# Patient Record
Sex: Female | Born: 1935
Health system: Southern US, Community
[De-identification: ages and names within clinical notes are randomized; demographics above are authoritative.]

## PROBLEM LIST (undated history)

## (undated) DIAGNOSIS — N3941 Urge incontinence: Secondary | ICD-10-CM

## (undated) DIAGNOSIS — M1990 Unspecified osteoarthritis, unspecified site: Secondary | ICD-10-CM

## (undated) DIAGNOSIS — R32 Unspecified urinary incontinence: Secondary | ICD-10-CM

## (undated) DIAGNOSIS — I10 Essential (primary) hypertension: Secondary | ICD-10-CM

## (undated) DIAGNOSIS — E785 Hyperlipidemia, unspecified: Secondary | ICD-10-CM

## (undated) DIAGNOSIS — I739 Peripheral vascular disease, unspecified: Secondary | ICD-10-CM

## (undated) DIAGNOSIS — E039 Hypothyroidism, unspecified: Secondary | ICD-10-CM

## (undated) DIAGNOSIS — M810 Age-related osteoporosis without current pathological fracture: Secondary | ICD-10-CM

## (undated) DIAGNOSIS — J310 Chronic rhinitis: Secondary | ICD-10-CM

## (undated) DIAGNOSIS — Z923 Personal history of irradiation: Secondary | ICD-10-CM

## (undated) DIAGNOSIS — L120 Bullous pemphigoid: Secondary | ICD-10-CM

## (undated) DIAGNOSIS — I700 Atherosclerosis of aorta: Principal | ICD-10-CM

## (undated) DIAGNOSIS — I059 Rheumatic mitral valve disease, unspecified: Secondary | ICD-10-CM

## (undated) HISTORY — DX: Hypothyroidism, unspecified: E03.9

## (undated) HISTORY — DX: Chronic rhinitis: J31.0

## (undated) HISTORY — DX: Hyperlipidemia, unspecified: E78.5

## (undated) HISTORY — DX: Age-related osteoporosis without current pathological fracture: M81.0

## (undated) HISTORY — PX: TONSILLECTOMY: SUR1361

## (undated) HISTORY — DX: Atherosclerosis of aorta: I70.0

## (undated) HISTORY — PX: OTHER SURGICAL HISTORY: SHX169

## (undated) HISTORY — DX: Rheumatic mitral valve disease, unspecified: I05.9

## (undated) HISTORY — DX: Essential (primary) hypertension: I10

## (undated) HISTORY — DX: Bullous pemphigoid: L12.0

## (undated) HISTORY — DX: Unspecified urinary incontinence: R32

## (undated) HISTORY — DX: Urge incontinence: N39.41

## (undated) HISTORY — DX: Peripheral vascular disease, unspecified: I73.9

---
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
## 1969-06-24 HISTORY — PX: TOTAL ABDOMINAL HYSTERECTOMY W/ BILATERAL SALPINGOOPHORECTOMY: SHX83

## 1998-12-23 HISTORY — PX: BUNIONECTOMY: SHX129

## 2000-02-22 HISTORY — PX: BUNIONECTOMY: SHX129

## 2000-02-22 HISTORY — PX: EYE SURGERY: SHX253

## 2002-03-24 HISTORY — PX: NASAL POLYP SURGERY: SHX186

## 2004-09-08 ENCOUNTER — Ambulatory Visit: Payer: Self-pay | Admitting: Adult Health

## 2004-09-22 ENCOUNTER — Ambulatory Visit: Payer: Self-pay | Admitting: Internal Medicine

## 2004-10-26 ENCOUNTER — Ambulatory Visit: Payer: Self-pay | Admitting: Internal Medicine

## 2004-12-07 ENCOUNTER — Ambulatory Visit: Payer: Self-pay | Admitting: Internal Medicine

## 2005-03-08 ENCOUNTER — Ambulatory Visit: Payer: Self-pay | Admitting: Internal Medicine

## 2005-07-15 ENCOUNTER — Encounter: Admission: RE | Admit: 2005-07-15 | Discharge: 2005-07-15 | Payer: Self-pay | Admitting: Otolaryngology

## 2005-10-06 ENCOUNTER — Encounter: Admission: RE | Admit: 2005-10-06 | Discharge: 2005-10-06 | Payer: Self-pay | Admitting: Otolaryngology

## 2005-10-10 ENCOUNTER — Ambulatory Visit (HOSPITAL_BASED_OUTPATIENT_CLINIC_OR_DEPARTMENT_OTHER): Admission: RE | Admit: 2005-10-10 | Discharge: 2005-10-10 | Payer: Self-pay | Admitting: Otolaryngology

## 2005-10-10 ENCOUNTER — Ambulatory Visit (HOSPITAL_COMMUNITY): Admission: RE | Admit: 2005-10-10 | Discharge: 2005-10-10 | Payer: Self-pay | Admitting: Otolaryngology

## 2005-10-10 ENCOUNTER — Encounter (INDEPENDENT_AMBULATORY_CARE_PROVIDER_SITE_OTHER): Payer: Self-pay | Admitting: Specialist

## 2007-01-03 ENCOUNTER — Encounter: Admission: RE | Admit: 2007-01-03 | Discharge: 2007-01-03 | Payer: Self-pay | Admitting: Internal Medicine

## 2007-05-21 ENCOUNTER — Ambulatory Visit: Payer: Self-pay | Admitting: Internal Medicine

## 2007-06-08 ENCOUNTER — Ambulatory Visit: Payer: Self-pay | Admitting: Internal Medicine

## 2007-06-21 ENCOUNTER — Ambulatory Visit: Payer: Self-pay | Admitting: Internal Medicine

## 2007-08-04 DIAGNOSIS — J310 Chronic rhinitis: Secondary | ICD-10-CM

## 2007-08-04 DIAGNOSIS — J45909 Unspecified asthma, uncomplicated: Secondary | ICD-10-CM

## 2007-10-09 ENCOUNTER — Ambulatory Visit: Payer: Self-pay | Admitting: Internal Medicine

## 2008-01-15 ENCOUNTER — Encounter: Payer: Self-pay | Admitting: Internal Medicine

## 2008-01-22 ENCOUNTER — Ambulatory Visit: Payer: Self-pay | Admitting: Internal Medicine

## 2008-01-22 LAB — CONVERTED CEMR LAB
Basophils Relative: 0.7 % (ref 0.0–1.0)
Eosinophils Absolute: 0.5 10*3/uL (ref 0.0–0.7)
Eosinophils Relative: 6.5 % — ABNORMAL HIGH (ref 0.0–5.0)
HCT: 43 % (ref 36.0–46.0)
Hemoglobin: 13.9 g/dL (ref 12.0–15.0)
MCV: 91 fL (ref 78.0–100.0)
Monocytes Absolute: 0.6 10*3/uL (ref 0.1–1.0)
Neutro Abs: 4.8 10*3/uL (ref 1.4–7.7)
RBC: 4.72 M/uL (ref 3.87–5.11)
WBC: 8.3 10*3/uL (ref 4.5–10.5)

## 2008-02-22 ENCOUNTER — Ambulatory Visit: Payer: Self-pay | Admitting: Internal Medicine

## 2008-06-24 ENCOUNTER — Ambulatory Visit: Payer: Self-pay | Admitting: Internal Medicine

## 2008-10-28 ENCOUNTER — Ambulatory Visit: Payer: Self-pay | Admitting: Internal Medicine

## 2009-05-20 ENCOUNTER — Ambulatory Visit: Payer: Self-pay | Admitting: Internal Medicine

## 2009-06-17 ENCOUNTER — Telehealth: Payer: Self-pay | Admitting: Internal Medicine

## 2009-11-18 ENCOUNTER — Ambulatory Visit: Payer: Self-pay | Admitting: Internal Medicine

## 2009-12-02 ENCOUNTER — Telehealth: Payer: Self-pay | Admitting: Internal Medicine

## 2009-12-18 ENCOUNTER — Telehealth (INDEPENDENT_AMBULATORY_CARE_PROVIDER_SITE_OTHER): Payer: Self-pay | Admitting: *Deleted

## 2009-12-23 ENCOUNTER — Ambulatory Visit: Payer: Self-pay | Admitting: Internal Medicine

## 2010-01-05 ENCOUNTER — Telehealth (INDEPENDENT_AMBULATORY_CARE_PROVIDER_SITE_OTHER): Payer: Self-pay | Admitting: *Deleted

## 2010-02-22 ENCOUNTER — Ambulatory Visit: Payer: Self-pay | Admitting: Internal Medicine

## 2010-05-24 ENCOUNTER — Ambulatory Visit: Payer: Self-pay | Admitting: Internal Medicine

## 2010-05-24 DIAGNOSIS — J330 Polyp of nasal cavity: Secondary | ICD-10-CM | POA: Insufficient documentation

## 2010-06-03 ENCOUNTER — Ambulatory Visit (HOSPITAL_BASED_OUTPATIENT_CLINIC_OR_DEPARTMENT_OTHER): Admission: RE | Admit: 2010-06-03 | Discharge: 2010-06-03 | Payer: Self-pay | Admitting: Urology

## 2010-07-15 ENCOUNTER — Telehealth (INDEPENDENT_AMBULATORY_CARE_PROVIDER_SITE_OTHER): Payer: Self-pay | Admitting: *Deleted

## 2010-11-23 NOTE — Assessment & Plan Note (Signed)
Summary: rov 3 months///kp   Primary Provider/Referring Provider:  Frederik Pear  CC:  3 month follow up visit-asthma; Doing great since on Advair HFA.Marland Kitchen  History of Present Illness:  November 18, 2009- Asthma, rhinitis Blames cold weather and the temperature change of going in and out for a worse winter. Humidifier has helped. Denies having obvious infection.  Quit Spiriva as unhelpful. She went back to Advair 100/50, which doesn't seem to last a half day. Cough productive clear with plugs. Denies heart burn. Persistent nasal stuffiness with some postnasal drip. Uses Neti pot some.  December 23, 2009-Asthma, rhinitis She liked Dulera sample a lot- improved both nose and chest, but unaffordable. She hasn't restarted Adviar yet. Both cause hoarseness. She feels well today with no wheeze.  Feb 22, 2010- Asthma, rhinitis She liked Advair HFA 230-21, but it caused intolerable hoarseness using 2 puffs two times a day.  She had failed Singulair and found Spiriva device too difficult. She is using the Advair 1 puff twice daily and says it gives great control.  May 24, 2010- Asthma, rhinitis, hx nasal polyps She is very pleased with Advair HFA, which has given her the best control of wheeze. It does still cause some hoarseness, which was the reason we took her off Advair Diskus.. She is only using it once daily. No cough, but some poistnasal drip. Has not used her rescue inhaler at all.   Asthma History    Initial Asthma Severity Rating:    Age range: 12+ years    Symptoms: 0-2 days/week    Nighttime Awakenings: 0-2/month    Interferes w/ normal activity: no limitations    SABA use (not for EIB): 0-2 days/week    Asthma Severity Assessment: Intermittent   Preventive Screening-Counseling & Management  Alcohol-Tobacco     Alcohol type: wine     Smoking Status: never     Passive Smoke Exposure: yes     Passive Smoke Counseling: to avoid passive smoke exposure  Current Medications  (verified): 1)  Centrum   Tabs (Multiple Vitamins-Minerals) .Marland Kitchen.. 1 By Mouth Once Daily 2)  Fish Oil   Oil (Fish Oil) 3)  Adult Aspirin Ec Low Strength 81 Mg  Tbec (Aspirin) .Marland Kitchen.. 1 By Mouth Once Daily 4)  Levothyroxine Sodium 50 Mcg  Tabs (Levothyroxine Sodium) .Marland Kitchen.. 1 By Mouth Once Daily 5)  Allegra 180 Mg  Tabs (Fexofenadine Hcl) .Marland Kitchen.. 1 By Mouth Once Daily As Needed 6)  Proair Hfa 108 (90 Base) Mcg/act Aers (Albuterol Sulfate) .... 2 Puffs Four Times A Day As Needed 7)  Vitamin D3 1000 Unit Tabs (Cholecalciferol) .... Take 1 By Mouth Once Daily 8)  Advair Hfa 230-21 Mcg/act Aero (Fluticasone-Salmeterol) .... Inhale 1 Puffs Once A Day.  Rinse Mouth Out After Use. 9)  Vitamin E 400 Unit Caps (Vitamin E) .... Take 1 By Mouth Once Daily 10)  Calcium 600 1500 Mg Tabs (Calcium Carbonate) .... Take 1 By Mouth Once Daily 11)  Fluticasone Propionate 50 Mcg/act Susp (Fluticasone Propionate) .Marland Kitchen.. 1 To 2 Sprays in Each Nostril Once Daily 12)  Mag-Ox 400 400 Mg Tabs (Magnesium Oxide) .... Take 1 By Mouth Once Daily  Allergies (verified): 1)  Codeine  Past History:  Past Medical History: Last updated: 01/22/2008 Asthma nasal polyps  Past Surgical History: Last updated: 01/22/2008 Nasal polypectomy x 2 - Dr. Annalee Genta  Family History: Last updated: 07-19-08 Mother- deceased age 62; natural causes; DM, Arthritis, Breast Cancer (age 37). Father- deceased age 33;  MI, DM. Sibling 1- living age 68; DM Type 2 Sibling 2- (half sister-father's side)living age 39; gasro int. disorders, allergies  Social History: Last updated: 07/01/2008 Patient never smoked.  Positive history of passive tobacco smoke exposure.  Exercise-times weekly Caffeine-2 cups daily ETOH-2-3 glasses of wine weekly Widowed with 3 children  Risk Factors: Smoking Status: never (05/24/2010) Passive Smoke Exposure: yes (05/24/2010)  Review of Systems      See HPI       The patient complains of shortness of breath with  activity, nasal congestion/difficulty breathing through nose, and sneezing.  The patient denies shortness of breath at rest, productive cough, non-productive cough, coughing up blood, chest pain, irregular heartbeats, acid heartburn, indigestion, loss of appetite, weight change, abdominal pain, difficulty swallowing, sore throat, tooth/dental problems, and headaches.         Does have persistent postnasal drip and some stuffiness  Vital Signs:  Patient profile:   77 year old female Height:      62 inches Weight:      160.50 pounds BMI:     29.46 O2 Sat:      95 % on Room air Pulse rate:   79 / minute BP sitting:   140 / 76  (right arm) Cuff size:   regular  Vitals Entered By: Reynaldo Minium CMA (May 24, 2010 10:47 AM)  O2 Flow:  Room air CC: 3 month follow up visit-asthma; Doing great since on Advair HFA.   Physical Exam  Additional Exam:  General: A/Ox3; pleasant and cooperative, NAD, SKIN: no rash, lesions NODES: no lymphadenopathy HEENT: [place]/AT, EOM- WNL, Conjuctivae- clear, PERRLA, TM-retracted, Nose- shiny pale mucosa  with question of recurrent polyp oin right., Throat- clear and wnl, hoarse without stridor, no post nasal drip, no thrush NECK: Supple w/ fair ROM, JVD- none, normal carotid impulses w/o bruits Thyroid-  CHEST: very clear to P&A HEART: RRR, no m/g/r heard ABDOMEN: Soft and nl; ZOX:WRUE, nl pulses, no edema  NEURO: Grossly intact to observation     Impression & Recommendations:  Problem # 1:  RHINITIS (ICD-472.0)  Persistent c/o sneezing, rhinorhea and recurrent nasal polyps. She didn't benefit from Singulair. Skin tests reportedly negative years ago.   Problem # 2:  ASTHMA (ICD-493.90) Very good control now without need for a rescue inhaler.  Problem # 3:  Hx of NASAL POLYP (ICD-471.0) Gaynelle Adu has had polypectomy twice. The ere is very shiney, flat polyp in R nare, vs shiney middle turbinate . This can be watched but I explained potential of nasal  steroid to suppress polyps.  Medications Added to Medication List This Visit: 1)  Advair Hfa 230-21 Mcg/act Aero (Fluticasone-salmeterol) .... Inhale 1 puffs once a day.  rinse mouth out after use. 2)  Astepro 0.15 % Soln (Azelastine hcl) .Marland Kitchen.. 1-2 sprays each nostril two times a day as needed  Other Orders: Est. Patient Level IV (45409)  Patient Instructions: 1)  Please schedule a follow-up appointment in 6 months. 2)  Try sample/ script Astepro, 1-2 puffs each nostril up to twice daily if needed for watery nose/ sneezing 3)  Continue fluticasone 1-2 sprays each nostril once daily to suppress the polyps. 4)  Other meds as before. 5)  Try sample of low dose Advair HFA, 2 puffs and rinse mouth , once or twice daily. if it works as well with less hoarseness, let us know and we will get you a script. Prescriptions: FLUTICASONE PROPIONATE 50 MCG/ACT SUSP (FLUTICASONE PROPIONATE) 1 to 2 sprays  in each nostril once daily  #1 x 6   Entered by:   Randell Loop CMA   Authorized by:   Waymon Budge MD   Signed by:   Randell Loop CMA on 05/24/2010   Method used:   Print then Give to Patient   RxID:   [id number] ASTEPRO 0.15 % SOLN (AZELASTINE HCL) 1-2 sprays each nostril two times a day as needed  #1 x prn   Entered and Authorized by:   Waymon Budge MD   Signed by:   Waymon Budge MD on 05/24/2010   Method used:   Print then Give to Patient   RxID:   [id number]

## 2010-11-23 NOTE — Progress Notes (Signed)
Summary: rx for advair HFA  Phone Note Call from Patient   Caller: Patient Call For: YOUNG Summary of Call: PT HAD SAMPLES OF LOW DOSE ADVAIR HFA . SHE WOULD LIKE PRESCRIPT CALLED TO [facility] [facility] [address] Initial call taken by: Rickard Patience,  July 15, 2010 10:22 AM  Follow-up for Phone Call        [facility]- ? if lower dose advair HFA is helping with hoarness. Vernie Murders  July 15, 2010 11:12 AM  called spoke with patient who states that the advair hfa has helped "tremendously" with her breathing and hoarseness.  would like to know if she may have a prescription.  advair hfa 45-21. Boone Master CNA/MA  July 16, 2010 3:53 PM   Additional Follow-up for Phone Call Additional follow up Details #1::        I made the change on med list. Please send. Additional Follow-up by: Waymon Budge MD,  July 16, 2010 5:04 PM    Additional Follow-up for Phone Call Additional follow up Details #2::    Rx was sent.  Spoke with pt and informed that this was done. Follow-up by: Vernie Murders,  July 16, 2010 5:12 PM  New/Updated Medications: ADVAIR HFA 45-21 MCG/ACT AERO (FLUTICASONE-SALMETEROL) 2 puffs and rinse mouth twice daily Prescriptions: ADVAIR HFA 45-21 MCG/ACT AERO (FLUTICASONE-SALMETEROL) 2 puffs and rinse mouth twice daily  #1 x 11   Entered by:   Vernie Murders   Authorized by:   Waymon Budge MD   Signed by:   Vernie Murders on 07/16/2010   Method used:   Electronically to        [facility] [phone]* (retail)       [address]       [place], [place]  [address]       Ph: [phone] or [phone]       Fax: [phone]   RxID:   [id number] ADVAIR HFA 45-21 MCG/ACT AERO (FLUTICASONE-SALMETEROL) 2 puffs and rinse mouth twice daily  #1 x prn   Entered by:   Waymon Budge MD   Authorized by:   Pulmonary Triage   Signed by:   Waymon Budge MD on 07/16/2010   Method used:   Historical   RxID:    [id number]

## 2010-11-23 NOTE — Assessment & Plan Note (Signed)
Summary: rov 2 months///kp   Primary Provider/Referring Provider:  Frederik Pear  CC:  2 month follow up visit.  History of Present Illness: 05/20/09- Asthma, rhinitis Too hard to get out in current hot weather, so she isn't going to her exercise program. Notes post nasal drip- clear.occasional wheeze. Denies cough, chest pain or palpitation.  November 18, 2009- Asthma, rhinitis Blames cold weather and the temperature change of going in and out for a worse winter. Humidifier has helped. Denies having obvious infection.  Quit Spiriva as unhelpful. She went back to Advair 100/50, which doesn't seem to last a half day. Cough productive clear with plugs. Denies heart burn. Persistent nasal stuffiness with some postnasal drip. Uses Neti pot some.  December 23, 2009-Asthma, rhinitis She liked Dulera sample a lot- improved both nose and chest, but unaffordable. She hasn't restarted Adviar yet. Both cause hoarseness. She feels well today with no wheeze.  Feb 22, 2010- Asthma, rhinitis She liked Advair HFA 230-21, but it caused intolerable hoarseness using 2 puffs two times a day.  She had failed Singulair and found Spiriva device too difficult. She is using the Advair 1 puff twice daily and says it gives great control.    Current Medications (verified): 1)  Centrum   Tabs (Multiple Vitamins-Minerals) .Marland Kitchen.. 1 By Mouth Once Daily 2)  Fish Oil   Oil (Fish Oil) 3)  Adult Aspirin Ec Low Strength 81 Mg  Tbec (Aspirin) .Marland Kitchen.. 1 By Mouth Once Daily 4)  Levothyroxine Sodium 50 Mcg  Tabs (Levothyroxine Sodium) .Marland Kitchen.. 1 By Mouth Once Daily 5)  Allegra 180 Mg  Tabs (Fexofenadine Hcl) .Marland Kitchen.. 1 By Mouth Once Daily As Needed 6)  Proair Hfa 108 (90 Base) Mcg/act Aers (Albuterol Sulfate) .... 2 Puffs Four Times A Day As Needed 7)  Vitamin D3 1000 Unit Tabs (Cholecalciferol) .... Take 1 By Mouth Once Daily 8)  Advair Hfa 230-21 Mcg/act Aero (Fluticasone-Salmeterol) .... Inhale 1 Puffs Two Times A Day.  Rinse Mouth Out After  Use. 9)  Vitamin E 400 Unit Caps (Vitamin E) .... Take 1 By Mouth Once Daily 10)  Calcium 600 1500 Mg Tabs (Calcium Carbonate) .... Take 1 By Mouth Once Daily 11)  Fluticasone Propionate 50 Mcg/act Susp (Fluticasone Propionate) .Marland Kitchen.. 1 To 2 Sprays in Each Nostril Once Daily 12)  Mag-Ox 400 400 Mg Tabs (Magnesium Oxide) .... Take 1 By Mouth Once Daily 13)  Zinc Gluconate 15 Mg Tabs (Zinc Gluconate) .... Take 1 By Mouth Once Daily  Allergies (verified): 1)  Codeine  Past History:  Past Medical History: Last updated: 01/22/2008 Asthma nasal polyps  Past Surgical History: Last updated: 01/22/2008 Nasal polypectomy x 2 - Dr. Annalee Genta  Family History: Last updated: 07-23-08 Mother- deceased age 57; natural causes; DM, Arthritis, Breast Cancer (age 89). Father- deceased age 35; MI, DM. Sibling 1- living age 51; DM Type 2 Sibling 2- (half sister-father's side)living age 61; gasro int. disorders, allergies  Social History: Last updated: 07/23/2008 Patient never smoked.  Positive history of passive tobacco smoke exposure.  Exercise-times weekly Caffeine-2 cups daily ETOH-2-3 glasses of wine weekly Widowed with 3 children  Risk Factors: Smoking Status: never (01/22/2008) Passive Smoke Exposure: yes (07/23/08)  Review of Systems      See HPI  The patient denies anorexia, fever, weight loss, weight gain, vision loss, decreased hearing, hoarseness, chest pain, syncope, dyspnea on exertion, peripheral edema, prolonged cough, headaches, hemoptysis, and severe indigestion/heartburn.    Vital Signs:  Patient profile:  77 year old female Height:      62 inches Weight:      159.50 pounds BMI:     29.28 O2 Sat:      97 % on Room air Pulse rate:   72 / minute BP sitting:   122 / 66  (left arm) Cuff size:   regular  Vitals Entered By: Reynaldo Minium CMA (Feb 22, 2010 10:43 AM)  O2 Flow:  Room air  Physical Exam  Additional Exam:  General: A/Ox3; pleasant and cooperative,  NAD, SKIN: no rash, lesions NODES: no lymphadenopathy HEENT: [place]/AT, EOM- WNL, Conjuctivae- clear, PERRLA, TM-retracted, Nose- clear, Throat- clear and wnl, hoarse without stridor, no post nasal drip, no thrush NECK: Supple w/ fair ROM, JVD- none, normal carotid impulses w/o bruits Thyroid-  CHEST: very clear to P&A HEART: RRR, no m/g/r heard ABDOMEN: Soft and nl; VVO:HYWV, nl pulses, no edema  NEURO: Grossly intact to observation     Impression & Recommendations:  Problem # 1:  ASTHMA (ICD-493.90) Her control is good enough now, that I suggested we test the lower limit by reducing Advair to once daily. If this is enough, she may see the benefit of reduced hoarseness. If she needs the control of two times a day therapy with Advair, then we can try adding a spacer. She is about to travel to [place].  Problem # 2:  RHINITIS (ICD-472.0) Assessment: Comment Only Not having a lot of sneezing or seasonal discomfort now.  Medications Added to Medication List This Visit: 1)  Advair Hfa 230-21 Mcg/act Aero (Fluticasone-salmeterol) .... Inhale 1 puffs two times a day.  rinse mouth out after use. 2)  Mag-ox 400 400 Mg Tabs (Magnesium oxide) .... Take 1 by mouth once daily 3)  Zinc Gluconate 15 Mg Tabs (Zinc gluconate) .... Take 1 by mouth once daily  Other Orders: Est. Patient Level III (37106)  Patient Instructions: 1)  Please schedule a follow-up appointment in 3 months. 2)  Try reducing Advair inhaler to one puff daily as you feel able. If this holds you, then notice if it makes your throat better at the lower dose.

## 2010-11-23 NOTE — Assessment & Plan Note (Signed)
Summary: ROV ///KP   Primary Provider/Referring Provider:  Frederik Pear  CC:  follow up visit.  History of Present Illness: 10/28/08-Asthma, rhinitis She never tried switch from Advair to Symbicort, and never tried Nasalcrom nasal spray. Likes ease of use of Advair. Discussed once daily use. C/o nasal pressure discomfort, not blowing anything out. Home is 35% humidity at [employer].  05/20/09- Asthma, rhinitis Too hard to get out in current hot weather, so she isn't going to her exercise program. Notes post nasal drip- clear.occasional wheeze. Denies cough, chest pain or palpitation.  November 18, 2009- Asthma, rhinitis Blames cold weather and the temperature change of going in and out for a worse winter. Humidifier has helped. Denies having obvious infection.  Quit Spiriva as unhelpful. She went back to Advair 100/50, which doesn't seem to last a half day. Cough productive clear with plugs. Denies heart burn. Persistent nasal stuffiness with some postnasal drip. Uses Neti pot some.  December 23, 2009-Asthma, thinitis She liked [facility] sample a lot- improved both nose and chest, but unaffordable. She hasn't restarted Adviar yet. Both cause hoarseness. She feels well today with no wheeze.   Current Medications (verified): 1)  Centrum   Tabs (Multiple Vitamins-Minerals) .Marland Kitchen.. 1 By Mouth Once Daily 2)  Fish Oil   Oil (Fish Oil) 3)  Adult Aspirin Ec Low Strength 81 Mg  Tbec (Aspirin) .Marland Kitchen.. 1 By Mouth Once Daily 4)  Levothyroxine Sodium 50 Mcg  Tabs (Levothyroxine Sodium) .Marland Kitchen.. 1 By Mouth Once Daily 5)  Allegra 180 Mg  Tabs (Fexofenadine Hcl) .Marland Kitchen.. 1 By Mouth Once Daily As Needed 6)  Proair Hfa 108 (90 Base) Mcg/act Aers (Albuterol Sulfate) .... 2 Puffs Four Times A Day As Needed 7)  Vitamin D3 1000 Unit Tabs (Cholecalciferol) .... Take 1 By Mouth Once Daily 8)  Advair Diskus 250-50 Mcg/dose Aepb (Fluticasone-Salmeterol) .Marland Kitchen.. 1 Puff and Rinse Mouth, Twice Daily 9)  Vitamin E 400 Unit Caps (Vitamin E)  .... Take 1 By Mouth Once Daily 10)  Calcium 600 1500 Mg Tabs (Calcium Carbonate) .... Take 1 By Mouth Once Daily 11)  Fluticasone Propionate 50 Mcg/act Susp (Fluticasone Propionate) .Marland Kitchen.. 1 To 2 Sprays in Each Nostril Once Daily  Allergies (verified): 1)  Codeine  Past History:  Past Medical History: Last updated: 01/22/2008 Asthma nasal polyps  Past Surgical History: Last updated: 01/22/2008 Nasal polypectomy x 2 - Dr. Annalee Genta  Family History: Last updated: 26-Jul-2008 Mother- deceased age 60; natural causes; DM, Arthritis, Breast Cancer (age 17). Father- deceased age 39; MI, DM. Sibling 1- living age 23; DM Type 2 Sibling 2- (half sister-father's side)living age 35; gasro int. disorders, allergies  Social History: Last updated: 07/26/08 Patient never smoked.  Positive history of passive tobacco smoke exposure.  Exercise-times weekly Caffeine-2 cups daily ETOH-2-3 glasses of wine weekly Widowed with 3 children  Risk Factors: Smoking Status: never (01/22/2008) Passive Smoke Exposure: yes (July 26, 2008)  Review of Systems      See HPI       The patient complains of hoarseness.  The patient denies anorexia, fever, weight loss, weight gain, vision loss, decreased hearing, chest pain, syncope, dyspnea on exertion, peripheral edema, prolonged cough, headaches, hemoptysis, abdominal pain, and severe indigestion/heartburn.    Vital Signs:  Patient profile:   77 year old female Height:      62 inches Weight:      161.25 pounds BMI:     29.60 O2 Sat:      97 % on Room air Pulse  rate:   70 / minute BP sitting:   124 / 76  (left arm) Cuff size:   regular  Vitals Entered By: Reynaldo Minium CMA (December 23, 2009 9:30 AM)  O2 Flow:  Room air  Physical Exam  Additional Exam:  General: A/Ox3; pleasant and cooperative, NAD, SKIN: no rash, lesions NODES: no lymphadenopathy HEENT: [place]/AT, EOM- WNL, Conjuctivae- clear, PERRLA, TM-retracted, Nose- clear, Throat- clear and wnl,  hoarse without stridor, no post nasal drip, no thrush NECK: Supple w/ fair ROM, JVD- none, normal carotid impulses w/o bruits Thyroid-  CHEST: very clear to P&A HEART: RRR, no m/g/r heard ABDOMEN: Soft and nl; ZOX:WRUE, nl pulses, no edema  NEURO: Grossly intact to observation     Impression & Recommendations:  Problem # 1:  ASTHMA (ICD-493.90) We had a long talk about the different mechanics and steroid doses of the LABA/ Steroid options. Eventually we may find a steroid inhaler alone is sufficient. I want to let her try an Advair MDI.  Problem # 2:  RHINITIS (ICD-472.0)  She notes increased drip as pollen seaspon starts. She has fluticasone and allegra.  Medications Added to Medication List This Visit: 1)  Proair Hfa 108 (90 Base) Mcg/act Aers (Albuterol sulfate) .... 2 puffs four times a day as needed  Other Orders: Est. Patient Level III (45409)  Patient Instructions: 1)  Please schedule a follow-up appointment in 2 months. 2)  Sample Advair MDI 240/21; 2puffs and rinse mouth well twice daily. 3)  Compare this to your experience with Advair diskus 100/50, and with Dulera 200/5. The prescription currently at your drug store is for the mid strength diskus Advair 250.50. 4)  We are looking at your experience with airway control and with hoarseness.

## 2010-11-23 NOTE — Progress Notes (Signed)
Summary: rx for advair mdi  Phone Note Call from Patient Call back at Home Phone [phone]   Caller: Patient Call For: young Reason for Call: Talk to Nurse Summary of Call: advair sample was great, need rx called in to pharmacy.  Notify pt. [employer]. Initial call taken by: Eugene Gavia,  January 05, 2010 8:56 AM  Follow-up for Phone Call        pt recently saw CY 12-23-2009 and was given sample of Advair MDI 240/21 2 puffs two times a day to try.  Pt states this med has helped her breathing and denies horseness.  pt requesting rx sent to pharmacy for this med.  please advise if ok or not.  thank you.  Aundra Millet Reynolds LPN  January 05, 2010 9:24 AM   Additional Follow-up for Phone Call Additional follow up Details #1::        Ok to send Rx for Advair HFA 230/21 Inhaler 2 puffs two times a day and rinse. Reynaldo Minium CMA  January 05, 2010 10:28 AM     Additional Follow-up for Phone Call Additional follow up Details #2::    rx sent. pt aware.  Aundra Millet Reynolds LPN  January 05, 2010 10:29 AM   New/Updated Medications: ADVAIR HFA 230-21 MCG/ACT AERO (FLUTICASONE-SALMETEROL) Inhale 2 puffs two times a day.  Rinse mouth out after use. Prescriptions: ADVAIR HFA 230-21 MCG/ACT AERO (FLUTICASONE-SALMETEROL) Inhale 2 puffs two times a day.  Rinse mouth out after use.  #1 x 6   Entered by:   Arman Filter LPN   Authorized by:   Waymon Budge MD   Signed by:   Arman Filter LPN on 09/81/1914   Method used:   Electronically to        [facility] [phone]* (retail)       [address]       [place], [place]  [address]       Ph: [phone] or [phone]       Fax: [phone]   RxID:   [id number]

## 2010-11-23 NOTE — Progress Notes (Signed)
Summary: PA for [facility] to Advair  Phone Note Call from Patient Call back at Home Phone [phone]   Caller: Patient Call For: young Reason for Call: Talk to Nurse Summary of Call: appt on Wed.  Running out of sample he gave her - Dulera.  Did you want to give her more samples?  Her insurance won't pay for it.  She said it works well - also nasonex. Food [facility]  Initial call taken by: Eugene Gavia,  December 18, 2009 8:52 AM  Follow-up for Phone Call        Prior Berkley Harvey has not been done for [facility]. The patient called regarding this on 12/02/09 and has not heard from anyone. I have called [facility] at [phone] and awaiting fax from them.  Fax received and placed on CDY cart.Michel Bickers [facility]  December 18, 2009 11:04 AM  Pt wants nurse to call her.  She knows she can get the [facility], but at what price.  She wants nurse to call him before rx is filled. Follow-up by: Eugene Gavia,  December 18, 2009 2:44 PM  Additional Follow-up for Phone Call Additional follow up Details #1::        Pt states that even if we do get PA approval for dulera she will still have to pay 81$ a month fo this inhaler.  She states that she does not want to pay this much.  She states that she had been on a low dose of advair before and wonders if she needs to just switch to a higher dose.  Also wants rx for nasonex.  Please advise directions for this. Thanks Additional Follow-up by: Vernie Murders,  December 18, 2009 3:18 PM    Additional Follow-up for Phone Call Additional follow up Details #2::    I have removed Dulera from med list and placed Advair 250/50 and Nasonex. Please send and notify her. Follow-up by: Waymon Budge MD,  December 21, 2009 10:04 PM  Additional Follow-up for Phone Call Additional follow up Details #3:: Details for Additional Follow-up Action Taken: called and spoke with pt.  pt aware to switch from [facility] to Advair.  pt also requests generic Nasonex.    rx for both advair and fluticasone called into pharmacy.  Megan Reynolds LPN  December 23, 4130 9:29 AM   New/Updated Medications: ADVAIR DISKUS 250-50 MCG/DOSE AEPB (FLUTICASONE-SALMETEROL) 1 puff and rinse mouth, twice daily NASONEX 50 MCG/ACT SUSP (MOMETASONE FUROATE) 1-2 puffs each nostril once daily FLUTICASONE PROPIONATE 50 MCG/ACT SUSP (FLUTICASONE PROPIONATE) 1 to 2 sprays in each nostril once daily Prescriptions: ADVAIR DISKUS 250-50 MCG/DOSE AEPB (FLUTICASONE-SALMETEROL) 1 puff and rinse mouth, twice daily  #1 x 11   Entered by:   Arman Filter LPN   Authorized by:   Waymon Budge MD   Signed by:   Arman Filter LPN on 44/10/270   Method used:   Electronically to        [facility] [phone]* (retail)       [address]       [place], [place]  [address]       Ph: [phone] or [phone]       Fax: [phone]   RxID:   [id number] FLUTICASONE PROPIONATE 50 MCG/ACT SUSP (FLUTICASONE PROPIONATE) 1 to 2 sprays in each nostril once daily  #1 x 11   Entered by:   Arman Filter LPN   Authorized by:   Rennis Chris  Young MD   Signed by:   Arman Filter LPN on 16/07/9603   Method used:   Electronically to        [facility] [phone]* (retail)       [address]       [place], [place]  [address]       Ph: [phone] or [phone]       Fax: [phone]   RxID:   [id number] NASONEX 50 MCG/ACT SUSP (MOMETASONE FUROATE) 1-2 puffs each nostril once daily  #1 x prn   Entered by:   Waymon Budge MD   Authorized by:   Pulmonary Triage   Signed by:   Waymon Budge MD on 12/21/2009   Method used:   Historical   RxID:   [id number] ADVAIR DISKUS 250-50 MCG/DOSE AEPB (FLUTICASONE-SALMETEROL) 1 puff and rinse mouth, twice daily  #1 x prn   Entered by:   Waymon Budge MD   Authorized by:   Pulmonary Triage   Signed by:   Waymon Budge MD on 12/21/2009   Method used:   Historical   RxID:   [id number]

## 2010-11-23 NOTE — Progress Notes (Signed)
Summary: PA-call after 10:30am  Phone Note Call from Patient Call back at Home Phone [phone]   Caller: Patient Call For: young Reason for Call: Talk to Nurse Summary of Call: [facility] inhaler - was given a sample at last visit.  Working really well, but not on pt's formullary.  Can you call insurance company and get an override so pt can get this med?  pt will be home from 10:30 to 1:30.  For formullary exception [phone]. Initial call taken by: Eugene Gavia,  December 02, 2009 9:07 AM  Follow-up for Phone Call        Pt needs to have rx filled and pharmacy will fax info to [place] to start PA. LMTCB.Reynaldo Minium CMA  December 02, 2009 10:39 AM    Spoke with pt; we are sending rx for dulera; pharmacy will fax over PA info and we will start PA.Reynaldo Minium CMA  December 02, 2009 10:57 AM     New/Updated Medications: DULERA 200-5 MCG/ACT AERO (MOMETASONE FURO-FORMOTEROL FUM) 2 puffs two times a day and RINSE MOUTH WELL Prescriptions: DULERA 200-5 MCG/ACT AERO (MOMETASONE FURO-FORMOTEROL FUM) 2 puffs two times a day and RINSE MOUTH WELL  #1 x 6   Entered by:   Reynaldo Minium CMA   Authorized by:   Waymon Budge MD   Signed by:   Reynaldo Minium CMA on 12/02/2009   Method used:   Electronically to        [facility] [phone]* (retail)       [address]       [place], [place]  [address]       Ph: [phone] or [phone]       Fax: [phone]   RxID:   [id number]

## 2010-11-23 NOTE — Assessment & Plan Note (Signed)
Summary: f/u 6 months///kp   Primary Provider/Referring Provider:  Art Chilton Si  CC:  6 month follow up visit-SOB and wheezing since start of cold weather..  History of Present Illness:  06/24/08- 77 year old woman returning for follow-up of asthma and rhinitis.  Went to [place] where weather was hot and dry.  Not sure that was any better than here.  Now feels okay, somewhat tired.  Thinks r statin drug makes her tired.  Has not needed albuterol in years. denies headache, nasal congestion, sneezing, or nasal discharge, postnasal drip or sore throat, chest pain or palpitation, adenopathy, or rash.  10/28/08-Asthma, rhinitis She never tried switch from Advair to Symbicort, and never tried Nasalcrom nasal spray. Likes ease of use of Advair. Discussed once daily use. C/o nasal pressure discomfort, not blowing anything out. Home is 35% humidity at [employer].  05/20/09- Asthma, rhinitis Too hard to get out in current hot weather, so she isn't going to her exercise program. Notes post nasal drip- clear.occasional wheeze. Denies cough, chest pain or palpitation.  November 18, 2009- Asthma, rhinitis Blames cold weather and the temperature change of going in and out for a worse winter. Humidifier has helped. Denies having obvious infection.  Quit Spiriva as unhelpful. She went back to Advair 100/50, which doesn't seem to last a half day. Cough productive clear with plugs. Denies heart burn. Persistent nasal stuffiness with some postnasal drip. Uses Neti pot some.  Current Medications (verified): 1)  Centrum   Tabs (Multiple Vitamins-Minerals) .Marland Kitchen.. 1 By Mouth Once Daily 2)  Fish Oil   Oil (Fish Oil) 3)  Adult Aspirin Ec Low Strength 81 Mg  Tbec (Aspirin) .Marland Kitchen.. 1 By Mouth Once Daily 4)  Levothyroxine Sodium 50 Mcg  Tabs (Levothyroxine Sodium) .Marland Kitchen.. 1 By Mouth Once Daily 5)  Allegra 180 Mg  Tabs (Fexofenadine Hcl) .Marland Kitchen.. 1 By Mouth Once Daily As Needed 6)  Albuterol 90 Mcg/act  Aers (Albuterol) .... 2 Puffs  Every 4 Hrs As Needed 7)  Vitamin D3 1000 Unit Tabs (Cholecalciferol) .... Take 1 By Mouth Once Daily 8)  Advair Diskus 100-50 Mcg/dose Aepb (Fluticasone-Salmeterol) .Marland Kitchen.. 1 Puff Two Times A Day and Rinse Mouth Well After Use 9)  Vitamin E 400 Unit Caps (Vitamin E) .... Take 1 By Mouth Once Daily 10)  Calcium 600 1500 Mg Tabs (Calcium Carbonate) .... Take 1 By Mouth Once Daily 11)  Vitamin D 1000 Unit Tabs (Cholecalciferol) .... Take 1 By Mouth Once Daily  Allergies (verified): 1)  Codeine  Past History:  Past Medical History: Last updated: 01/22/2008 Asthma nasal polyps  Past Surgical History: Last updated: 01/22/2008 Nasal polypectomy x 2 - Dr. Annalee Genta  Family History: Last updated: 2008-07-27 Mother- deceased age 57; natural causes; DM, Arthritis, Breast Cancer (age 86). Father- deceased age 17; MI, DM. Sibling 1- living age 88; DM Type 2 Sibling 2- (half sister-father's side)living age 67; gasro int. disorders, allergies  Social History: Last updated: 2008-07-27 Patient never smoked.  Positive history of passive tobacco smoke exposure.  Exercise-times weekly Caffeine-2 cups daily ETOH-2-3 glasses of wine weekly Widowed with 3 children  Risk Factors: Smoking Status: never (01/22/2008) Passive Smoke Exposure: yes (2008/07/27)  Review of Systems      See HPI       The patient complains of dyspnea on exertion and prolonged cough.  The patient denies anorexia, fever, weight loss, weight gain, vision loss, decreased hearing, hoarseness, chest pain, syncope, peripheral edema, headaches, hemoptysis, abdominal pain, and severe indigestion/heartburn.    Vital  Signs:  Patient profile:   77 year old female Height:      62 inches Weight:      161 pounds BMI:     29.55 O2 Sat:      94 % on Room air Pulse rate:   93 / minute BP sitting:   150 / 86  (left arm) Cuff size:   regular  Vitals Entered By: Reynaldo Minium CMA (November 18, 2009 9:26 AM)  O2 Flow:  Room  air  Physical Exam  Additional Exam:  General: A/Ox3; pleasant and cooperative, NAD, SKIN: no rash, lesions NODES: no lymphadenopathy HEENT: [place]/AT, EOM- WNL, Conjuctivae- clear, PERRLA, TM-retracted, Nose- clear, Throat- clear and wnl, hoarse without stridor, no post nasal drip NECK: Supple w/ fair ROM, JVD- none, normal carotid impulses w/o bruits Thyroid-  CHEST: end expiratory crackles, wheeze in LUL HEART: RRR, no m/g/r heard ABDOMEN: Soft and nl; ZOX:WRUE, nl pulses, no edema  NEURO: Grossly intact to observation     Impression & Recommendations:  Problem # 1:  RHINITIS (ICD-472.0)  Hx of nasal polyps. I dont see any now, but will add nasal steroid  Problem # 2:  ASTHMA (ICD-493.90) We will try changing Advair to Duleara 200/5.  Medications Added to Medication List This Visit: 1)  Advair Diskus 100-50 Mcg/dose Aepb (Fluticasone-salmeterol) .Marland Kitchen.. 1 puff two times a day and rinse mouth well after use 2)  Vitamin E 400 Unit Caps (Vitamin e) .... Take 1 by mouth once daily 3)  Calcium 600 1500 Mg Tabs (Calcium carbonate) .... Take 1 by mouth once daily 4)  Vitamin D 1000 Unit Tabs (Cholecalciferol) .... Take 1 by mouth once daily  Other Orders: Est. Patient Level III (45409)  Patient Instructions: 1)  Please schedule a follow-up appointment in 1 month. 2)  Sample Nasonex, 2 sprays each nostril once daily 3)  Sample Dulera 200/5, 2 puffs and rinse twice daily. Use this instead of Advair for duration of sample

## 2010-11-24 ENCOUNTER — Ambulatory Visit (INDEPENDENT_AMBULATORY_CARE_PROVIDER_SITE_OTHER): Payer: Medicare Other | Admitting: Internal Medicine

## 2010-11-24 ENCOUNTER — Ambulatory Visit: Admit: 2010-11-24 | Payer: Self-pay | Admitting: Internal Medicine

## 2010-11-24 ENCOUNTER — Encounter: Payer: Self-pay | Admitting: Internal Medicine

## 2010-11-24 DIAGNOSIS — J310 Chronic rhinitis: Secondary | ICD-10-CM

## 2010-11-24 DIAGNOSIS — J45909 Unspecified asthma, uncomplicated: Secondary | ICD-10-CM

## 2010-11-24 DIAGNOSIS — J330 Polyp of nasal cavity: Secondary | ICD-10-CM

## 2010-12-01 NOTE — Assessment & Plan Note (Signed)
Summary: 6 month   Vital Signs:  Patient profile:   77 year old female Height:      62.5 inches Weight:      156.50 pounds BMI:     28.27 O2 Sat:      98 % on Room air Pulse rate:   75 / minute BP sitting:   148 / 82  (left arm) Cuff size:   regular  Vitals Entered By: Gweneth Dimitri RN (November 24, 2010 9:57 AM)  O2 Flow:  Room air CC: 6 month follow up.  Pt states since starting of Advair HFA breathing is "wonderful."  Denies SOB, wheezing, chest tightness., cough. Comments Medications reviewed with patient Daytime contact number verified with patient. Gweneth Dimitri RN  November 24, 2010 9:59 AM    Primary Provider/Referring Provider:  Art Chilton Si  CC:  6 month follow up.  Pt states since starting of Advair HFA breathing is "wonderful."  Denies SOB, wheezing, chest tightness., and cough..  History of Present Illness:  December 23, 2009-Asthma, rhinitis She liked Dulera sample a lot- improved both nose and chest, but unaffordable. She hasn't restarted Adviar yet. Both cause hoarseness. She feels well today with no wheeze.  Feb 22, 2010- Asthma, rhinitis She liked Advair HFA 230-21, but it caused intolerable hoarseness using 2 puffs two times a day.  She had failed Singulair and found Spiriva device too difficult. She is using the Advair 1 puff twice daily and says it gives great control.  May 24, 2010- Asthma, rhinitis, hx nasal polyps She is very pleased with Advair HFA, which has given her the best control of wheeze. It does still cause some hoarseness, which was the reason we took her off Advair Diskus.. She is only using it once daily. No cough, but some poistnasal drip. Has not used her rescue inhaler at all.  November 24, 2010-  Asthma, rhinitis, hx nasal polyps Nurse-CC: 6 month follow up.  Pt states since starting of Advair HFA breathing is "wonderful."  Denies SOB, wheezing, chest tightness., cough. Using Advair HFA 45-21, just one puff once daily and denies chest  tightness, wheeze, cough. Gets hoarse occasionally still, on way to dining hall- suggesting there is an environmental factor where she lives. . Some postnasal drip treated with flonase- also helps a lot. . Denies any reflux. Continues fishoil.     Asthma History    Asthma Control Assessment:    Age range: 12+ years    Symptoms: 0-2 days/week    Nighttime Awakenings: 0-2/month    Interferes w/ normal activity: no limitations    SABA use (not for EIB): 0-2 days/week    Asthma Control Assessment: Well Controlled   Preventive Screening-Counseling & Management  Alcohol-Tobacco     Alcohol type: wine     Smoking Status: never     Passive Smoke Exposure: yes     Passive Smoke Counseling: to avoid passive smoke exposure  Current Medications (verified): 1)  Centrum   Tabs (Multiple Vitamins-Minerals) .Marland Kitchen.. 1 By Mouth Once Daily 2)  Fish Oil   Oil (Fish Oil) .... Once Daily 3)  Adult Aspirin Ec Low Strength 81 Mg  Tbec (Aspirin) .Marland Kitchen.. 1 By Mouth Once Daily 4)  Levothyroxine Sodium 75 Mcg Tabs (Levothyroxine Sodium) .... Take 1 Tablet By Mouth Once A Day 5)  Vitamin D3 1000 Unit Tabs (Cholecalciferol) .... Take 1 By Mouth Once Daily 6)  Vitamin E 400 Unit Caps (Vitamin E) .... Take 1  By Mouth Once Daily 7)  Proair Hfa 108 (90 Base) Mcg/act Aers (Albuterol Sulfate) .... 2 Puffs Four Times A Day As Needed 8)  Advair Hfa 45-21 Mcg/act Aero (Fluticasone-Salmeterol) .Marland Kitchen.. 1 Puff Once Daily 9)  Calcium 600 1500 Mg Tabs (Calcium Carbonate) .... Take 1 By Mouth Once Daily 10)  Allegra 180 Mg  Tabs (Fexofenadine Hcl) .Marland Kitchen.. 1 By Mouth Once Daily As Needed 11)  Fluticasone Propionate 50 Mcg/act Susp (Fluticasone Propionate) .Marland Kitchen.. 1 To 2 Sprays in Each Nostril Once Daily 12)  Mag-Ox 400 400 Mg Tabs (Magnesium Oxide) .... Take 1 By Mouth Once Daily  Allergies (verified): 1)  Codeine  Past History:  Past Medical History: Last updated: 01/22/2008 Asthma nasal polyps  Family History: Last updated:  07-22-2008 Mother- deceased age 69; natural causes; DM, Arthritis, Breast Cancer (age 31). Father- deceased age 62; MI, DM. Sibling 1- living age 80; DM Type 2 Sibling 2- (half sister-father's side)living age 35; gasro int. disorders, allergies  Social History: Last updated: 07/22/08 Patient never smoked.  Positive history of passive tobacco smoke exposure.  Exercise-times weekly Caffeine-2 cups daily ETOH-2-3 glasses of wine weekly Widowed with 3 children  Risk Factors: Smoking Status: never (11/24/2010) Passive Smoke Exposure: yes (11/24/2010)  Past Surgical History: Nasal polypectomy x 2 - Dr. Annalee Genta Tonsillectomy T A H and B S O bunionectomy milk cyst right  breast  Review of Systems      See HPI       The patient complains of nasal congestion/difficulty breathing through nose and sneezing.  The patient denies shortness of breath with activity, shortness of breath at rest, productive cough, non-productive cough, coughing up blood, chest pain, irregular heartbeats, acid heartburn, indigestion, loss of appetite, weight change, abdominal pain, difficulty swallowing, sore throat, tooth/dental problems, headaches, rash, change in color of mucus, and fever.    Physical Exam  Additional Exam:  General: A/Ox3; pleasant and cooperative, NAD, SKIN: no rash, lesions NODES: no lymphadenopathy HEENT: [place]/AT, EOM- WNL, Conjuctivae- clear, PERRLA, TM-retracted, Nose-clear, Throat- clear and wnl, hoarse without stridor, no post nasal drip, no thrush NECK: Supple w/ fair ROM, JVD- none, normal carotid impulses w/o bruits Thyroid-  CHEST: clear to P&A but raspy cough once HEART: RRR, no m/g/r heard ABDOMEN: Soft and nl; JXB:JYNW, nl pulses, no edema  NEURO: Grossly intact to observation     Impression & Recommendations:  Problem # 1:  ASTHMA (ICD-493.90) She feels asthma control is the best it has been in years. We explained that this would be variable with season changes. She  understands to  go back up on her Advair HFA dose to 2 puffs two times a day when needed.  She likes to be on minimal meds which is fine.   Problem # 2:  RHINITIS (ICD-472.0)  Especially with her hx of nasal polyps, she will continue flonase.   Problem # 3:  Hx of NASAL POLYP (ICD-471.0) Discussed relationship between this finding, asthma and aspirin/ NSAIDs in some people.  Medications Added to Medication List This Visit: 1)  Fish Oil Oil (Fish oil) .... Once daily 2)  Levothyroxine Sodium 75 Mcg Tabs (Levothyroxine sodium) .... Take 1 tablet by mouth once a day 3)  Advair Hfa 45-21 Mcg/act Aero (Fluticasone-salmeterol) .Marland Kitchen.. 1 puff once daily  Other Orders: Est. Patient Level IV (29562)  Patient Instructions: 1)  Please schedule a follow-up appointment in 6 months. Please call sooner if needed.    Orders Added: 1)  Est. Patient Level IV [13086]  Immunization History:  Influenza Immunization History:    Influenza:  historical (07/24/2010)   Immunization History:  Influenza Immunization History:    Influenza:  Historical (07/24/2010)

## 2010-12-31 ENCOUNTER — Other Ambulatory Visit: Payer: Self-pay | Admitting: Dermatology

## 2011-03-08 NOTE — Assessment & Plan Note (Signed)
[facility]                             PULMONARY OFFICE NOTE   NAME:BLOOMFIELDDayane, Hillenburg                      MRN:          [id number]  DATE:05/21/2007                            DOB:          1924/07/08    PULMONARY CONSULTATION   PROBLEM:  An 77 year old woman referred through the courtesy of Dr.  Annalee Genta in consultation because of asthma.  She is followed by Dr.  Lenon Curt. Green for primary care.   HISTORY:  She was diagnosed with asthma in 2000.  She had had  significant allergic rhinitis and some episodes of asthma, at least as a  teenager.  She had been having trouble in [place].  Around 1980, she  moved to [place] where she was no better, noting persistent nasal  congestion.  She then moved here a few years ago and says she has been  doing worse in [place].  Symptoms are worse in the spring, but  perennial.  Allergy skin testing was negative in [place] a long time  ago.  Asthma has never required hospital stay.  Triggers have included  house dust, temperature changes involved with going outdoors, weather  changes.  She had seen Dr. Sherene Sires in 2005, who felt she had mild chronic  asthma.  She is being treated with Advair and he suggested she return at  her next exacerbation.   MEDICATIONS:  1. Vitamins.  2. Fish oil.  3. Advair 100/50.  4. Aspirin 81 mg.  5. Flonase.  6. Levothyroxine 50 mcg.  7. Allegra 180 mg used p.r.n.  8. Albuterol rescue inhaler.  9. No history of intolerance to contrast dye, latex, or aspirin.   REVIEW OF SYSTEMS:  Sneezing with dust exposure, productive cough with  white sputum, post-nasal drip.  Exertional dyspnea, nasal congestion,  pressure headaches.  She denies chest pains, palpitations, acid  indigestion, or reflux, swelling feet, fever, purulent or bloody  discharge.   PAST HISTORY:  Nasal polypectomy twice.  Tonsillectomy.  Mitral valve  prolapse.  Asthma.  Allergic rhinitis.  She was hospitalized  for  dehydration and gastroenteritis after a trip to [place].  No history of  pneumonia, but has had episodic bronchitis.  Pneumococcal vaccine 3  years ago, which would be 2005, caused a large local reaction.  She does  not want to repeat.  Hysterectomy.  No problems recognized from exposure  to animals, food, or insects.  She is selective about what cosmetics she  can tolerate.   SOCIAL HISTORY:  Never smoked.  Occasional glass of wine.  She is  widowed with children.  Now lives at [facility].  She had been a Runner, broadcasting/film/video.   FAMILY HISTORY:  Daughter with allergic rhinitis as a child.  Otherwise,  nobody with respiratory problems or allergy complaints.   OBJECTIVE:  Weight 153 pounds, BP 128/68, pulse 78, room air saturation  98%.  This is an alert, talkative woman, medium build, no evident distress.  SKIN:  Clear.  ADENOPATHY:  None found.  HEENT:  Nasal airway and palate clear.  No stridor.  No neck vein  distension.  No post-nasal drainage.  CHEST:  Mild wheeze at the left scapula was transient.  With laughter,  breath sounds are a bit raspy and congested, but breathing is unlabored.  There is no dullness.  HEART:  Regular rhythm, no murmur.  EXTREMITIES:  No cyanosis, clubbing, or edema.   IMPRESSION:  1. Asthma.  2. Rhinitis.  History has suggested sensitivity to nonspecific      irritants and possibly also seasonal pollens.   PLAN:  We are scheduling pulmonary function tests and chest x-ray with  office return in 1 month, earlier p.r.n.  We will look at options based  on objective findings at that point.  I appreciate the chance to meet  her.     Clinton D. Maple Hudson, MD, Tonny Bollman, FACP  Electronically Signed    CDY/MedQ  DD: 05/21/2007  DT: 05/22/2007  Job #: [id number]   cc:   Onalee Hua L. Annalee Genta, M.D.  Lenon Curt Chilton Si, M.D.

## 2011-03-08 NOTE — Assessment & Plan Note (Signed)
[facility]                             PULMONARY OFFICE NOTE   NAME:Gilbert, Jordan                      MRN:          [id number]  DATE:06/21/2007                            DOB:          10-16-1924    PROBLEM LIST:  1. Asthma.  2. Rhinitis.   HISTORY:  She cannot tell if Advair helps her; used just once daily.  Feels hoarse; a little tight in the throat.  No phlegm.  Not coughing  much.  Does not notice problems swallowing or refluxing.   MEDICATIONS:  Vitamins, fish oil, Advair 100/50, aspirin 81 mg, Flonase,  levothyroxine 50 mcg, allegro 180 mg p.r.n., albuterol inhaler.   OBJECTIVE:  Weight 156 pounds, blood pressure 128/68, pulse 78, room air  saturation 96%.  Minimal hoarseness.  Heart sounds are regular.  There  is no visible drainage.  No stridor.  Lung fields are quiet and clear.  No edema.  Chest x-ray from July 28 showed no acute findings.  Heart  size stable.  Lungs clear.  Pulmonary function test in August had shown  mild obstructive disease mainly in small airways with insignificant  response to bronchodilator, hyperinflation and air trapping with  diffusion mildly decreased.   IMPRESSION:  Asthma with probably a mild fixed chronic obstructive  component in this never smoker.  Mild nonspecific rhinitis.   PLAN:  She is going to try using Advair one puff b.i.d. regularly for  awhile and then stop it for awhile and then restart it.  Make up her own  mind whether it helps.  She was also given samples of Spiriva to try  once daily with instructions.  Schedule to return in 4 months, earlier  p.r.n.     Clinton D. Maple Hudson, MD, Tonny Bollman, FACP  Electronically Signed    CDY/MedQ  DD: 06/25/2007  DT: 06/25/2007  Job #: [id number]   cc:   Lenon Curt Chilton Si, M.D.  Kinnie Scales. Annalee Genta, M.D.

## 2011-03-11 NOTE — Op Note (Signed)
[name], [name]             ACCOUNT NO.:  [id number]   MEDICAL RECORD NO.:  [id number]          PATIENT TYPE:  AMB   LOCATION:  DSC                          FACILITY:  [facility]   PHYSICIAN:  Onalee Hua L. Annalee Genta, M.D.DATE OF BIRTH:  11-18-1923   DATE OF PROCEDURE:  10/10/2005  DATE OF DISCHARGE:                                 OPERATIVE REPORT   PREOPERATIVE DIAGNOSES:  1.  Chronic sinusitis.  2.  Chronic nasal polyposis.  3.  Status post prior endoscopic sinus surgery.   POSTOPERATIVE DIAGNOSIS:  1.  Chronic sinusitis.  2.  Chronic nasal polyposis.  3.  Status post prior endoscopic sinus surgery.   INDICATIONS FOR SURGERY:  1.  Chronic sinusitis.  2.  Chronic nasal polyposis.  3.  Status post prior endoscopic sinus surgery.   SURGICAL PROCEDURES:  Bilateral bilateral revision endoscopic sinus surgery  with InstaTrak guidance consisting of bilateral total ethmoidectomy,  bilateral maxillary antrostomy with removal of diseased tissue, bilateral  nasal frontal recess exploration and right sphenoidotomy with removal of  diseased tissue.   ANESTHESIA:  General endotracheal.   SURGEON:  Kinnie Scales. Annalee Genta, M.D.   COMPLICATIONS:  None.   ESTIMATED BLOOD LOSS:  Approximately 100 cc.   DISPOSITION:  The patient transferred from the operating room to recovery  room in stable condition.   BRIEF HISTORY:  [name] is a 77 year old white female who is referred  for evaluation of chronic and progressive nasal airway obstruction, chronic  sinusitis and worsening reactive airway disease.  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  She  had undergone prior sinus surgery approximately four years ago and had  initial improvement with breathing and nasal airway obstruction.  After  moving to [facility], the patient had noted progressive symptoms.  She  was seen in the office and treated with aggressive medical therapy  including  antibiotics and steroids and despite this treatment continued to have  chronic sinus obstruction and ongoing symptoms.  CT scanning after treatment  revealed chronic nasal polyposis.  She had undergone limited sinus surgery  and has obstruction of the ethmoid, maxillary and  nasal frontal recess  regions bilaterally. The patient was counseled regarding various treatment  options, and I recommended we consider bilateral endoscopic revision  endoscopic sinus surgery.  The patient was comfortable with this plan.  The  risks and benefits and possible complications of the procedure were  discussed in detail with the patient and her daughter and they understood  and concurred with our plan for surgery which is scheduled as above.  The  patient was pretreated with oral steroids and an InstaTrak CT scan was  obtained for computer-assisted navigation throughout the operation.   DESCRIPTION OF PROCEDURE:  The patient was brought to the operating room on  October 10, 2005, and placed in supine position on the operating table.  General endotracheal anesthesia was established without difficulty, and  patient was adequately anesthetized.  Her nose was injected with a total of  10 cc of 1% lidocaine, 1:100,000 solution epinephrine was injected along  the  middle turbinate, lateral nasal walls and anterior aspect of the sphenoid  sinus and within the intranasal polyps bilaterally.  She was also injected  via transoral approach to the sphenopalatine region with a total of 10 cc  injected.  Her nose was then packed with Afrin-soaked cottonoid pledgets.  The patient was prepped and draped and positioned on the operating table.  The InstaTrak headgear was applied, and anatomic and surgical landmarks were  identified and confirmed.  The patient's surgical procedure begun with 0  degrees nasal endoscopy.  She was found to have partial resection of the  left middle turbinate with polypoid disease  within the common ethmoid and  maxillary region.  On the right-hand side, she had lateralization of the  middle turbinates with significant polypoid disease emanating from the  middle meatus and sphenoethmoid recess.   Surgical procedure was begun on the left hand side with endoscopic sinus  surgery.  Beginning with the 0 degrees telescope, a straight microdebrider  and the InstaTrak polypoid disease was resected from within the nasal cavity  to the level of the maxillary ostium.  It was completely occluded with  disease.  Using a 45 degrees telescope, the  ostium was enlarged in an  inferior and posterior direction creating a widely patent ostium. Polypoid  disease was removed from the left maxillary sinus with a curved  microdebrider.  Tissue was removed from the sinus for pathologic evaluation  for allergic fungal mucin and this was sent as a separate pathologic  specimen.  Attention was then turned to the ethmoid cavity where significant  scarring and polypoid disease was noted.  Dissection was carried out from  anterior to posterior identifying the posterior ethmoid air cells and  the  roof of the ethmoid sinus.  Dissection was then carried from posterior to  anterior along the roof of the ethmoid debriding polypoid disease and bony  septations using microdebrider under direct visualization with a 45 degree  telescope. The nasal frontal recess was identified and InstaTrak was used to  confirm.  This was then debrided with a curved microdebrider along the nasal  frontal recess completely removing the residual ethmoid air cells and  polypoid disease and creating a  widely patent frontal sinus.   Attention was then turned to the patient's right-hand side.  The middle  turbinate was medialized and there was heavy polypoid disease within the  common ethmoid middle meatus and maxillary region.  This was debrided using a straight microdebrider and this and a 0 degree telescope.  Dissection  was  then carried out along the floor of the ethmoid sinus removing bony  septations and diseased mucosa.  The roof of the ethmoid was identified and  again with the InstaTrak this was confirmed.  Dissection was carried from  posterior to anterior along the roof of the ethmoid sinus using the  InstaTrak, a curved microdebrider and a 45 degree telescope.  Significant  polypoid disease was encountered in the anterior ethmoid and nasal frontal  region, and this was resected using the microdebrider.  A widely patent  ethmoid cavity and the nasal frontal recess was completely opened and free  of polypoid disease.  There was no evidence of significant infection within  the frontal sinus at this time.  Attention was then to the lateral nasal  wall where the natural ostia of the maxillary sinus was occluded with  polypoid disease. This was enlarged in an inferior posterior direction and  polypoid mucosa from  within the maxillary sinus was then debrided with a  curved microdebrider.  Lastly, the sphenoid sinus was addressed.  There was  heavy polypoid disease in the sphenoethmoid recess and this was debrided  under direct visualization using a 0 degrees telescope. The posterior  inferior aspect of middle turbinate was then partially resected using  through cutting forceps and the inferior aspect of the superior turbinate  was also resected to create a widely patent sphenoethmoid recess.  Polypoid  disease was removed, and the natural sphenoid ostium was identified with the  InstaTrak.  Dissection then carried out to enlarge the ostium and remove  polypoid disease and a lateral inferior direction creating a widely patent  sphenoid ostium.  Thick polypoid debris was then  removed from within the  maxillary sinus using a microdebrider with gentle suction and aspiration.  Minimal bleeding was encountered.   The patient's nasal cavity and nasopharynx were then irrigated and  suctioned.  Meticulous  hemostasis was obtained using Bovie suction cautery  along the left residual middle turbinate and along the lateral attachment  middle turbinate left-hand side.  Surgical debris was resected and the nasal  cavity was widely patent with minimal active bleeding.  The patient's  sinuses were then filled with approximately 10 cc of a 50/50 mixture of  Kenalog 40 and Bactroban cream. It  was gently instilled within the ethmoid  and maxillary sinuses bilaterally.  Kennedy sinus packs were then placed and  hydrated with saline.  The nasal cavity, nasopharynx, oral cavity and  oropharynx were then irrigated and aspirated. An oral gastric tube was  passed.  The stomach contents were aspirated. The patient was awakened from  anesthetic, was extubated, transferred to the operating room to recovery  room in stable condition.  There were no complications and blood loss was  approximately 100 mL.           ______________________________ Kinnie Scales. Annalee Genta, M.D.     DLS/MEDQ  D:  16/07/9603  T:  10/12/2005  Job:  [id number]

## 2011-05-25 ENCOUNTER — Ambulatory Visit: Payer: PRIVATE HEALTH INSURANCE | Admitting: Internal Medicine

## 2011-06-09 ENCOUNTER — Encounter: Payer: Self-pay | Admitting: Internal Medicine

## 2011-06-09 ENCOUNTER — Ambulatory Visit (INDEPENDENT_AMBULATORY_CARE_PROVIDER_SITE_OTHER): Payer: Medicare Other | Admitting: Internal Medicine

## 2011-06-09 VITALS — BP 124/68 | HR 73 | Ht 62.5 in | Wt 149.8 lb

## 2011-06-09 DIAGNOSIS — J45909 Unspecified asthma, uncomplicated: Secondary | ICD-10-CM

## 2011-06-09 DIAGNOSIS — J330 Polyp of nasal cavity: Secondary | ICD-10-CM

## 2011-06-09 DIAGNOSIS — J310 Chronic rhinitis: Secondary | ICD-10-CM

## 2011-06-09 NOTE — Assessment & Plan Note (Addendum)
More symptomatic.  Plan- increase flonase to 2 puffs each nostril daily and add an otc antihistamine

## 2011-06-09 NOTE — Progress Notes (Signed)
  Subjective:    Patient ID: [name], female    DOB: Aug 10, 1924, 77 y.o.   MRN: [id number]  HPI 06/09/11- 35 year old never smoker followed for asthma and rhinitis with history of nasal polyp complicated by hypothyroidism. Last here November 24, 2010-note reviewed She had no problem through the spring and summer, using Advair HFA only once daily and she skipped while visiting in [place]. Does note sneeze and postnasal drip. Flonase insufficient.   Review of Systems Constitutional:   No-   weight loss, night sweats, fevers, chills, fatigue, lassitude. HEENT:   No-  headaches, difficulty swallowing, tooth/dental problems, sore throat,       +  sneezing, itching, ear ache, nasal congestion, post nasal drip,  CV:  No-   chest pain, orthopnea, PND, swelling in lower extremities, anasarca, dizziness, palpitations Resp: No-   shortness of breath with exertion or at rest.              No-   productive cough,  No non-productive cough,  No-  coughing up of blood.              No-   change in color of mucus.  No- wheezing.   Skin: No-   rash or lesions. GI:  No-   heartburn, indigestion, abdominal pain, nausea, vomiting, diarrhea,                 change in bowel habits, loss of appetite GU: No-   dysuria, change in color of urine, no urgency or frequency.  No- flank pain. MS:  No-   joint pain or swelling.  No- decreased range of motion.  No- back pain. Neuro- grossly normal to observation, Or:  Psych:  No- change in mood or affect. No depression or anxiety.  No memory loss.      Objective:   Physical Exam General- Alert, Oriented, Affect-appropriate, Distress- none acute Skin- rash-none, lesions- none, excoriation- none Lymphadenopathy- none Head- atraumatic            Eyes- Gross vision intact, PERRLA, conjunctivae clear secretions            Ears- Hearing, canals normal            Nose- Clear, no-Septal dev, mucus, polyps, erosion, perforation             Throat- Mallampati II ,  mucosa clear , drainage- none, tonsils- atrophic     + Mild hoarseness Neck- flexible , trachea midline, no stridor , thyroid nl, carotid no bruit Chest - symmetrical excursion , unlabored           Heart/CV- RRR , no murmur , no gallop  , no rub, nl s1 s2                           - JVD- none , edema- none, stasis changes- none, varices- none           Lung- clear to P&A, wheeze- none, cough- none , dullness-none, rub- none           Chest wall-  Abd- tender-no, distended-no, bowel sounds-present, HSM- no Br/ Gen/ Rectal- Not done, not indicated Extrem- cyanosis- none, clubbing, none, atrophy- none, strength- nl Neuro- grossly intact to observation         Assessment & Plan:

## 2011-06-09 NOTE — Assessment & Plan Note (Addendum)
I have given permission to stay off Advair and see if she really needs it.  We noted that she felt Advair made a big difference in her quality of life as recently as last winter.

## 2011-06-09 NOTE — Patient Instructions (Signed)
Ok to try off Advair as long as your breathing is comfortable  Suggest moving up to 2 puffs daily with the fluticasone/ flonase nasal spray  Suggest adding an otc antihistamine like loratadine(claritin) or fexofenadine (allegra) as needed

## 2011-06-13 ENCOUNTER — Other Ambulatory Visit: Payer: Self-pay | Admitting: Internal Medicine

## 2011-06-16 ENCOUNTER — Other Ambulatory Visit: Payer: Self-pay | Admitting: Internal Medicine

## 2011-06-16 MED ORDER — FLUTICASONE PROPIONATE 50 MCG/ACT NA SUSP: 2.0000 | Freq: Every day | NASAL | Status: DC

## 2011-10-14 ENCOUNTER — Other Ambulatory Visit: Payer: Self-pay | Admitting: Internal Medicine

## 2011-10-21 ENCOUNTER — Other Ambulatory Visit: Payer: Self-pay | Admitting: *Deleted

## 2011-10-21 MED ORDER — BUDESONIDE-FORMOTEROL FUMARATE 160-4.5 MCG/ACT IN AERO: 2.0000 | INHALATION_SPRAY | Freq: Two times a day (BID) | RESPIRATORY_TRACT | Status: DC

## 2011-10-21 NOTE — Telephone Encounter (Signed)
Per [facility] to give Symbicort 160/4.5 2 puffs bid and Rinse mouth well after use. Called to [employer].

## 2011-12-14 ENCOUNTER — Telehealth: Payer: Self-pay | Admitting: Internal Medicine

## 2011-12-14 ENCOUNTER — Ambulatory Visit (INDEPENDENT_AMBULATORY_CARE_PROVIDER_SITE_OTHER): Payer: Medicare Other | Admitting: Internal Medicine

## 2011-12-14 ENCOUNTER — Encounter: Payer: Self-pay | Admitting: Internal Medicine

## 2011-12-14 VITALS — BP 116/64 | HR 71 | Ht 62.5 in | Wt 147.2 lb

## 2011-12-14 DIAGNOSIS — J330 Polyp of nasal cavity: Secondary | ICD-10-CM

## 2011-12-14 DIAGNOSIS — J310 Chronic rhinitis: Secondary | ICD-10-CM

## 2011-12-14 DIAGNOSIS — J45909 Unspecified asthma, uncomplicated: Secondary | ICD-10-CM

## 2011-12-14 NOTE — Patient Instructions (Signed)
Sample Symbicort 80   Try 2 puffs, once or twice daily- and rinse mouth Or-  Sample Tudorza   1 puff, twice daily  Try a topical antibacterial ointment on a Q tip in your nose- like Neosporin

## 2011-12-14 NOTE — Progress Notes (Signed)
Patient ID: [name], female    DOB: Feb 17, 1924, 78 y.o.   MRN: [id number]  HPI 06/09/11- 59 year old never smoker followed for asthma and rhinitis with history of nasal polyp complicated by hypothyroidism. Last here November 24, 2010-note reviewed She had no problem through the spring and summer, using Advair HFA only once daily and she skipped while visiting in [place]. Does note sneeze and postnasal drip. Flonase insufficient.   12/14/11- 06/09/11- 56 year old never smoker followed for asthma and rhinitis with history of nasal polyp complicated by hypothyroidism. She has been using Symbicort once daily. She usually gets worse a few hours later. She did not need Symbicort until the cool weather began. We talked about observation during the warmer months, to see how being off Symbicort then will affect this hoarseness. . Currently she still notices wheezing if she tries to omit Symbicort for a few days. She is not recognizing significant postnasal drainage or reflux. She has been noticing some watery rhinorrhea and has developed a sore just at the edge of her left nostril.  Review of Systems- see HPI Constitutional:   No-   weight loss, night sweats, fevers, chills, fatigue, lassitude. HEENT:   No-  headaches, difficulty swallowing, tooth/dental problems, sore throat,       No-  sneezing, itching, ear ache, nasal congestion, post nasal drip, + watery rhinorhea CV:  No-   chest pain, orthopnea, PND, swelling in lower extremities, anasarca, dizziness, palpitations Resp: No-   shortness of breath with exertion or at rest.              No-   productive cough,  No non-productive cough,  No-  coughing up of blood.              No-   change in color of mucus.  No- wheezing.   Skin: No-   rash or lesions. GI:  No-   heartburn, indigestion, abdominal pain, nausea, vomiting, diarrhea,                 change in bowel habits, loss of appetite GU: MS:  No-   joint pain or swelling.  No- decreased  range of motion.  No- back pain. Neuro- grossly normal to observation, Or:  Psych:  No- change in mood or affect. No depression or anxiety.  No memory loss.      Objective:   Physical Exam General- Alert, Oriented, Affect-appropriate, Distress- none acute Skin- rash-none, lesions- none, excoriation- none Lymphadenopathy- none Head- atraumatic            Eyes- Gross vision intact, PERRLA, conjunctivae clear secretions            Ears- Hearing, canals normal            Nose- Clear, no-Septal dev, mucus, polyps, erosion, perforation. There is a superficial mucosal sore at L nostril            Throat- Mallampati II , mucosa clear , drainage- none, tonsils- atrophic     + Mild hoarseness Neck- flexible , trachea midline, no stridor , thyroid nl, carotid no bruit Chest - symmetrical excursion , unlabored           Heart/CV- RRR , no murmur , no gallop  , no rub, nl s1 s2                           - JVD- none , edema- none, stasis changes-  none, varices- none           Lung- clear to P&A, wheeze- none, cough- none , dullness-none, rub- none           Chest wall-  Abd- Br/ Gen/ Rectal- Not done, not indicated Extrem- cyanosis- none, clubbing, none, atrophy- none, strength- nl Neuro- grossly intact to observation

## 2011-12-14 NOTE — Telephone Encounter (Signed)
I spoke with pt and advised her this has already been added to her med list and needed nothing further

## 2011-12-14 NOTE — Telephone Encounter (Signed)
LMOM informing pt medication has been added to her allergy list.

## 2011-12-18 NOTE — Assessment & Plan Note (Signed)
No visible polyps currently

## 2011-12-18 NOTE — Assessment & Plan Note (Addendum)
To see if we can isolate the issue of hoarseness and Symbicort, she is going to try replacing Symbicort with a sample of Tudorza. As an alternative, try a sample of Symbicort 80 with less cortisone.

## 2011-12-18 NOTE — Assessment & Plan Note (Signed)
Topical antibiotic ointment for nose.  Try Tudorza sample for chest and nose. It may dry her enough to reduce rhinorhea. Discussed antihistamines. Consider ipratropium nasal spray.

## 2012-01-06 ENCOUNTER — Telehealth: Payer: Self-pay | Admitting: Internal Medicine

## 2012-01-06 NOTE — Telephone Encounter (Signed)
Spoke with pt. She states that she is doing very well with symbicort and something came up and she had to cancel appt for this month. Wants to know since she is doing so well if she will need to follow up with you later in the year. Please advise when you want to see her back, thanks!

## 2012-01-09 MED ORDER — BUDESONIDE-FORMOTEROL FUMARATE 80-4.5 MCG/ACT IN AERO: INHALATION_SPRAY | RESPIRATORY_TRACT | Status: DC

## 2012-01-09 NOTE — Telephone Encounter (Signed)
Called spoke with patient, advised of CDY's recs.  1 year follow up scheduled for 2.19.14 with CDY.    Per 2.20.13 ov with CDY, pt given sample symbicort 80-4.55mcg - 2 puffs once or twice daily.  Script for symbicort 80-4.12mcg sent to [facility].  Pt to call if needed sooner.

## 2012-01-09 NOTE — Telephone Encounter (Signed)
I just need to see her once a year if we are filling meds. She can ask her PCP to fill, then see [place] only if needed, if she would prefer.

## 2012-01-13 ENCOUNTER — Ambulatory Visit: Payer: Medicare Other | Admitting: Internal Medicine

## 2012-12-12 ENCOUNTER — Ambulatory Visit (INDEPENDENT_AMBULATORY_CARE_PROVIDER_SITE_OTHER): Payer: Medicare Other | Admitting: Internal Medicine

## 2012-12-12 ENCOUNTER — Encounter: Payer: Self-pay | Admitting: Internal Medicine

## 2012-12-12 VITALS — BP 152/80 | HR 85 | Ht 62.5 in | Wt 157.8 lb

## 2012-12-12 DIAGNOSIS — J45909 Unspecified asthma, uncomplicated: Secondary | ICD-10-CM

## 2012-12-12 MED ORDER — ALBUTEROL SULFATE HFA 108 (90 BASE) MCG/ACT IN AERS: 2.0000 | INHALATION_SPRAY | Freq: Four times a day (QID) | RESPIRATORY_TRACT | Status: DC | PRN

## 2012-12-12 MED ORDER — BUDESONIDE-FORMOTEROL FUMARATE 160-4.5 MCG/ACT IN AERO: 2.0000 | INHALATION_SPRAY | Freq: Two times a day (BID) | RESPIRATORY_TRACT | Status: DC

## 2012-12-12 MED ORDER — AEROCHAMBER MV MISC: Status: DC

## 2012-12-12 NOTE — Assessment & Plan Note (Signed)
Good control with low-dose Symbicort. We discussed Symbicort and alternatives. Plan-try AeroChamber spacer to reduce hoarseness. Consider ENT

## 2012-12-12 NOTE — Patient Instructions (Addendum)
Script/ order- aerochamber to use with the Symbicort inhaler

## 2012-12-12 NOTE — Progress Notes (Signed)
Patient ID: [name], female    DOB: [birth year], 79 y.o.   MRN: [id number]  HPI 06/09/11- 79 year old never smoker followed for asthma and rhinitis with history of nasal polyp complicated by hypothyroidism. Last here November 24, 2010-note reviewed She had no problem through the spring and summer, using Advair HFA only once daily and she skipped while visiting in [place]. Does note sneeze and postnasal drip. Flonase insufficient.   12/14/11- 06/09/11- 79 year old never smoker followed for asthma and rhinitis with history of nasal polyp complicated by hypothyroidism. She has been using Symbicort once daily. She usually gets worse a few hours later. She did not need Symbicort until the cool weather began. We talked about observation during the warmer months, to see how being off Symbicort then will affect this hoarseness. . Currently she still notices wheezing if she tries to omit Symbicort for a few days. She is not recognizing significant postnasal drainage or reflux. She has been noticing some watery rhinorrhea and has developed a sore just at the edge of her left nostril.  12/12/12-  79 year old never smoker followed for asthma and rhinitis with history of nasal polyp complicated by hypothyroidism. Bad feet limit her walking and contribute to weight gain. Breathing is not limiting. She feels better off with Symbicort, continued one puff once daily. Blames it for making her hoarse. Humidifier in bedroom helps. Has had pneumonia vaccine in the past and we discussed current guidelines. Rhinitis is adequately controlled.  Review of Systems- see HPI Constitutional:   No-   weight loss, night sweats, fevers, chills, fatigue, lassitude. HEENT:   No-  headaches, difficulty swallowing, tooth/dental problems, sore throat,       No-  sneezing, itching, ear ache, nasal congestion, post nasal drip, + watery rhinorhea CV:  No-   chest pain, orthopnea, PND, swelling in lower extremities, anasarca,  dizziness, palpitations Resp: No-   shortness of breath with exertion or at rest.              No-   productive cough,  No non-productive cough,  No-  coughing up of blood.              No-   change in color of mucus.  No- wheezing.   Skin: No-   rash or lesions. GI:  No-   heartburn, indigestion, abdominal pain, nausea, vomiting, d GU: MS:  +   joint pain or swelling.  Neuro-Nothing unusual  Psych:  No- change in mood or affect. No depression or anxiety.  No memory loss.      Objective:   Physical Exam General- Alert, Oriented, Affect-appropriate, Distress- none acute Skin- rash-none, lesions- none, excoriation- none Lymphadenopathy- none Head- atraumatic            Eyes- Gross vision intact, PERRLA, conjunctivae clear secretions            Ears- Hearing, canals normal            Nose- Clear, no-Septal dev, mucus, polyps, erosion, perforation. There is a superficial mucosal sore at L nostril            Throat- Mallampati II , mucosa clear , drainage- none, tonsils- atrophic     + Mild hoarseness Neck- flexible , trachea midline, no stridor , thyroid nl, carotid no bruit Chest - symmetrical excursion , unlabored           Heart/CV- RRR , no murmur , no gallop  , no rub, nl s1 s2                           -  JVD- none , edema- none, stasis changes- none, varices- none           Lung- clear to P&A, wheeze- none, cough- none , dullness-none, rub- none           Chest wall-  Abd- Br/ Gen/ Rectal- Not done, not indicated Extrem- cyanosis- none, clubbing, none, atrophy- none, strength- nl Neuro- grossly intact to observation

## 2013-01-11 LAB — HM DEXA SCAN: HM Dexa Scan: 6

## 2013-01-11 LAB — HM MAMMOGRAPHY

## 2013-01-16 ENCOUNTER — Non-Acute Institutional Stay: Payer: Medicare Other | Admitting: Geriatric Medicine

## 2013-01-16 ENCOUNTER — Encounter: Payer: Self-pay | Admitting: Geriatric Medicine

## 2013-01-16 VITALS — BP 110/62 | HR 76 | Ht 62.0 in | Wt 156.0 lb

## 2013-01-16 DIAGNOSIS — M25551 Pain in right hip: Secondary | ICD-10-CM

## 2013-01-16 DIAGNOSIS — E039 Hypothyroidism, unspecified: Secondary | ICD-10-CM

## 2013-01-16 DIAGNOSIS — Z0000 Encounter for general adult medical examination without abnormal findings: Secondary | ICD-10-CM

## 2013-01-16 DIAGNOSIS — N3941 Urge incontinence: Secondary | ICD-10-CM

## 2013-01-16 DIAGNOSIS — J45909 Unspecified asthma, uncomplicated: Secondary | ICD-10-CM | POA: Insufficient documentation

## 2013-01-16 DIAGNOSIS — M25559 Pain in unspecified hip: Secondary | ICD-10-CM

## 2013-01-16 DIAGNOSIS — E785 Hyperlipidemia, unspecified: Secondary | ICD-10-CM | POA: Insufficient documentation

## 2013-01-16 DIAGNOSIS — I10 Essential (primary) hypertension: Secondary | ICD-10-CM

## 2013-01-16 NOTE — Assessment & Plan Note (Addendum)
Continues PTNS, pt feels this is helpful though she continues to have precipitous urge incontinence, wears pad 24/7. She does limit some activity due to UUI. Pt. stopped Myrbetriq- felt it did not improve situation

## 2013-01-16 NOTE — Progress Notes (Signed)
Patient ID: [name], female   DOB: [birth year], 79 y.o.   MRN: [id number]  Chief Complaint  Patient presents with  . Annual Exam    thyroid, blood pressure, asthma,    HPI   This 79 year old female resident of [facility], independent living section, presents to clinic today for her annual exam. She has been generally feeling well, remains active with her family and community. Routine lab work completed last week was all within normal limits. Patient scheduled and underwent mammogram and DEXA scan last week, results are pending. Patient continues to follow with Dr. Jetty Duhamel for her allergic asthma exam, is experiencing mild symptoms from this as the springtime arrives. She also follows with Dr. Annabell Howells for her chronic urinary incontinence. Continues with PTNS treatment which she feels has been helpful. Patient's only complaint today is a new onset of some right hip pain. Pain occurs with weightbearing, located in the posterior hip. Patient continues to exercise regularly including exercise class and Zumba. She notes the hip discomfort is causing her to be a little less active.  Allergies  Allergies  Allergen Reactions  . Sulfa Antibiotics   . Chlorine   . Codeine   . Macrobid (Nitrofurantoin Monohydrate Macrocrystals)   . Statins   . Trimethoprim    Medications  Current outpatient prescriptions:aspirin 81 MG tablet, Take 81 mg by mouth daily.  , Disp: , Rfl: ;  budesonide-formoterol (SYMBICORT) 160-4.5 MCG/ACT inhaler, Inhale 2 puffs into the lungs 2 (two) times daily. Rinse mouth, Disp: 1 Inhaler, Rfl: prn;  Cholecalciferol (VITAMIN D3) 1000 UNITS CAPS, Take 1 capsule by mouth daily.  , Disp: , Rfl: ;  fish oil-omega-3 fatty acids 1000 MG capsule, Take 1 g by mouth daily., Disp: , Rfl:  ibuprofen (ADVIL,MOTRIN) 200 MG tablet, Take 200 mg by mouth. Take one tablet daily as needed for pain, Disp: , Rfl: ;  levothyroxine (SYNTHROID, LEVOTHROID) 75 MCG tablet, Take 75  mcg by mouth daily.  , Disp: , Rfl: ;  NON FORMULARY, Take 500 mg by mouth daily. MANGOSTEEN ANTIOXIDANT, Disp: , Rfl: ;  Probiotic Product (PROBIOTIC DAILY PO), Take by mouth. Take one daily to protect colon while taking antibiotic, Disp: , Rfl:  albuterol (PROAIR HFA) 108 (90 BASE) MCG/ACT inhaler, Inhale 2 puffs into the lungs 4 (four) times daily as needed., Disp: 1 Inhaler, Rfl: prn  Data Reviewed   Radiology: 2004 Mammogram Normal done in [place]  03-10-05 Mammogram-negative 10/07 Mammogram  - negative 12/2006: Gallbladder U/S: 1. Normal GB, no stones. 2. Suspect fatty liver infiltration. 3. Bil. renal cysts, largest 5.4cm. 4.  Aortic atherosclerosis, no aneurysm. 06/2008 - Mammogram: nl exam 03/12/10 DXA scan: Tscore (radius) -3.6. 25yr. fracture risk is significantly elevated  Mammogram: Stable, no suspicious findings 12/2012 Mammogram  Dexa Scan  Lab:  02/23/10 Vit D 26.7   TSH 5.4TC 211,    TG 96, HDL 65, LDL 1115/17/12: TC 197, TG 90, HDL 61, LDL 118   TSH .876 06/12/2012 BMP: Sodium 141, Potassium 4.6, glucose 89, BUN 25, Creatinine 1.00 TSH 0.34 01/10/2013 WBC 7.6., Hgb 14.5, Hct 42.6, Plt 331  Glucose 86, BUN 24, creatinine 0.88, sodium 142, potassium 4.4. Protein/LFTs WNL  Cholesterol 204, triglyceride 100, HDL 64, LDL 120  TSH 1.18 Other: 1999 Sigmoidoscopy   Past Medical History  Diagnosis Date  . Nasal polyps   . Osteoporosis   . Unspecified hypothyroidism   . Mitral valve disorders   . Chronic rhinitis   . Unspecified urinary  incontinence   . Asthma   . Hyperlipidemia   . Hypertension   . Urge incontinence    Past Surgical History  Procedure Laterality Date  . Nasal polyp surgery  03/2002    x2 Dr Annalee Genta  . Tonsillectomy    . Total abdominal hysterectomy w/ bilateral salpingoophorectomy  1970's  . Bunionectomy Left 12/1998  . Milk cyst right breast    . Breast lumpectomy Right 1953  . Bunionectomy Right 02/2000  . Eye surgery Bilateral 02/2000     Cataract removal     Family History  Problem Relation Age of Onset  . Diabetes Mother     Adult onset  . Arthritis Mother   . Breast cancer Mother 34  . Heart attack Father   . Diabetes Father     Adult onset  . Heart disease Father   . Heart Problems Father     Arrhythmia requiring pacemaker  . Diabetes type II      sibling  . Allergies      1/2 sister(father's side)  . Diabetes Brother   . Fibromyalgia Daughter     History   Social History Narrative   Widowed, married in [place], spouse died 11/09/1998. Patient lives in [place] [facility] since 2007. Patient has a living well. She is a retired Runner, broadcasting/film/video. No history of smoking cigarettes, drinks alcohol about once a week. Exercises regularly.    REVIEW of SYSTEMS:   DATA OBTAINED: from patient GENERAL: Feels well. No fevers, fatigue, change in activity status. No change in appetite, weight or sleep. SKIN: No itch, rash or open wounds EYES: No eye pain, dryness or itching. No change in vision EARS: No earache, tinnitus, change in hearing NOSE: No congestion, drainage or bleeding MOUTH/THROAT: No mouth or tooth pain. No difficulty chewing or swallowing. RESPIRATORY: No cough, wheezing, SOB CARDIAC: No chest pain, palpitations. No edema. CHEST/BREASTS: No discomfort, discharge or lumps in breasts GI: No abdominal pain. No N/V/D or constipation. No heartburn or reflux.  GU: No dysuria, frequency or urgency. No change in urine volume or character. Incontinence MUSCULOSKELETAL: No joint swelling or stiffness. No back pain. No muscle ache, pain, weakness. Gait is steady. No recent falls. Rt.hip pain NEUROLOGIC: No dizziness, fainting, headache, imbalance, numbness. No change in mental status.  PSYCHIATRIC: No anxiety, depression, behavior issue. Sleeps well.    PHYSICAL EXAM:  Filed Vitals:   01/16/13 1437  BP: 110/62  Pulse: 76   Filed Weights   01/16/13 1437  Weight: 156 lb (70.761 kg)   GENERAL  APPEARANCE: No acute distress, appropriately groomed, normal body habitus. Alert, pleasant, conversant. HEAD: Normocephalic, atraumatic EYES: Conjunctiva/lids clear. Pupils round, reactive. EOMs intact.  EARS: External exam WNL, canals clear, TM WNL. Hearing grossly normal. NOSE: No deformity or discharge. MOUTH/THROAT: Lips w/o lesions. Oral mucosa, tongue moist, w/o lesion. Oropharynx w/o redness or lesions.  NECK: Supple, full ROM. No thyroid tenderness, enlargement or nodule LYMPHATICS: No head, neck or supraclavicular adenopathy RESPIRATORY: Breathing is even, unlabored. Lung sounds are clear and full.  CARDIOVASCULAR: Heart RRR. No murmur or extra heart sounds  ARTERIAL: No carotid, aortic or femoral bruit. 2+ pulse at Bilateral carotid, femoral, DP. No popliteal or PT pulse  palpable  VENOUS: No varicosities. No venous stasis skin changes  EDEMA: No peripheral or periorbital edema.   GASTROINTESTINAL: Abdomen is soft, non-tender, not distended w/ normal bowel sounds. No hepatic or splenic enlargement. No mass, ventral or inguinal hernia. GENITOURINARY: Bladder non tender,  not distended. MUSCULOSKELETAL: Moves all extremities with full ROM, strength and tone. Back is without kyphosis, scoliosis or spinal process tenderness. Gait is steady. Tenderness along rt. Ileo-tibial band NEUROLOGIC: Oriented to time, place, person. Cranial nerves 2-12 grossly intact, speech clear, no tremor. Patella, brachial DTR 2+. PSYCHIATRIC: Mood and affect appropriate to situation  ASSESSMENT/PLAN  Urge incontinence Continues PTNS, pt feels this is helpful though she continues to have precipitous urge incontinence, wears pad 24/7. She does limit some activity due to UUI. Pt. stopped Myrbetriq- felt it did not improve situation  Hypertension BP stable off medications. Lab satisfactory  Allergic asthma Follows with Dr.Young. No current symptoms, continue present medications  Right hip  pain NewRt.posterior/lateral hip discomfort with weight bearing after exercise. Tender IT band at posterior hip and lateral just abve rt. Knee. No joint tenderness, swelling, crepitus. Recommend gentle stretching of hip muscles to reduce discomfort.    Unspecified hypothyroidism TSH remains in range, asymptomatic. Continue present Synthroid dose   Follow up: 6 months  Asberry Lascola T.Harvie Morua, NP-C

## 2013-01-21 ENCOUNTER — Encounter: Payer: Self-pay | Admitting: Geriatric Medicine

## 2013-01-22 ENCOUNTER — Encounter: Payer: Self-pay | Admitting: Geriatric Medicine

## 2013-01-22 DIAGNOSIS — E039 Hypothyroidism, unspecified: Secondary | ICD-10-CM | POA: Insufficient documentation

## 2013-01-22 NOTE — Assessment & Plan Note (Signed)
BP stable off medications. Lab satisfactory

## 2013-01-22 NOTE — Assessment & Plan Note (Signed)
TSH remains in range, asymptomatic. Continue present Synthroid dose

## 2013-01-22 NOTE — Assessment & Plan Note (Signed)
NewRt.posterior/lateral hip discomfort with weight bearing after exercise. Tender IT band at posterior hip and lateral just abve rt. Knee. No joint tenderness, swelling, crepitus. Recommend gentle stretching of hip muscles to reduce discomfort.

## 2013-01-22 NOTE — Assessment & Plan Note (Addendum)
Follows with Dr.Young. No current symptoms, continue present medications

## 2013-01-24 ENCOUNTER — Other Ambulatory Visit: Payer: Self-pay | Admitting: Internal Medicine

## 2013-01-29 ENCOUNTER — Encounter: Payer: Self-pay | Admitting: Internal Medicine

## 2013-02-05 ENCOUNTER — Encounter: Payer: Self-pay | Admitting: Internal Medicine

## 2013-02-20 ENCOUNTER — Other Ambulatory Visit: Payer: Self-pay | Admitting: Geriatric Medicine

## 2013-07-17 ENCOUNTER — Encounter: Payer: Self-pay | Admitting: Geriatric Medicine

## 2013-08-07 ENCOUNTER — Encounter: Payer: Medicare Other | Admitting: Geriatric Medicine

## 2013-08-21 ENCOUNTER — Encounter: Payer: Medicare Other | Admitting: Geriatric Medicine

## 2013-08-28 ENCOUNTER — Encounter: Payer: Self-pay | Admitting: Geriatric Medicine

## 2013-08-28 ENCOUNTER — Non-Acute Institutional Stay: Payer: Medicare Other | Admitting: Geriatric Medicine

## 2013-08-28 VITALS — BP 120/68 | HR 80 | Ht 62.0 in | Wt 150.0 lb

## 2013-08-28 DIAGNOSIS — J45909 Unspecified asthma, uncomplicated: Secondary | ICD-10-CM

## 2013-08-28 DIAGNOSIS — I739 Peripheral vascular disease, unspecified: Secondary | ICD-10-CM

## 2013-08-28 DIAGNOSIS — N3941 Urge incontinence: Secondary | ICD-10-CM

## 2013-08-28 DIAGNOSIS — I700 Atherosclerosis of aorta: Secondary | ICD-10-CM

## 2013-08-28 DIAGNOSIS — I10 Essential (primary) hypertension: Secondary | ICD-10-CM

## 2013-08-28 NOTE — Progress Notes (Signed)
Patient ID: [name], female   DOB: 24-Jun-1924, 79 y.o.   MRN: [id number] [facility] [phone])  Code Status: LivingWill  Contact Information   Name Relation Home Work Mobile   Harjo,[name] Other [phone]         Chief Complaint  Patient presents with  . Medical Managment of Chronic Issues    blood pressure, thyroid, asthma    HPI: This is a 79 y.o. female resident of [facility], Independent Living section evaluated today for management of ongoing medical issues.    Last visit: Urge incontinence Continues PTNS, pt feels this is helpful though she continues to have precipitous urge incontinence, wears pad 24/7. She does limit some activity due to UUI. Pt. stopped Myrbetriq- felt it did not improve situation  Hypertension BP stable off medications. Lab satisfactory  Allergic asthma Follows with Dr.Young. No current symptoms, continue present medications  Right hip pain NewRt.posterior/lateral hip discomfort with weight bearing after exercise. Tender IT band at posterior hip and lateral just abve rt. Knee. No joint tenderness, swelling, crepitus. Recommend gentle stretching of hip muscles to reduce discomfort.    Unspecified hypothyroidism TSH remains in range, asymptomatic. Continue present Synthroid dose   Today patient reports having a scary episode about 3-4 weeks ago. She was reclining on her couch and suddenly felt her heart beating very rapidly, felt it in her throat heart, felt like it was pounding. As this episode was resolving her daughter called on the phone and patient found she was having difficulty forming words. All symptoms resolved within a few minutes. She did not call for any assistance. There's been no recurrence of any of these symptoms. Patient reminds me that she has mitral valve prolapse and on occasion does have palpitations.  Bladder incontinence continues to be a problem. Patient continues with PTNS  treatments by urology. She reports she's been taking Myrbetriq only "as needed". Notes that she frequently "has absolutely no control" of her bladder.   No recent symptoms related to asthma  No complaints of right posterior/ lateral hip discomfort , patient does complain of bilateral thigh pain with walking. Patient continues to be active with several days  a week exercise classes including once a week Zumba class. She also tries to walk frequently however bilateral thigh pain slows her down. After some distance she finds she feels weakness and discomfort in the back of her thighs, improves with rest.  Review of record shows abdominal ultrasound from 2008 with evidence of aortic atherosclerosis, no aneurysm.   Allergies  Allergen Reactions  . Sulfa Antibiotics   . Chlorine   . Codeine   . Macrobid [Nitrofurantoin Monohydrate Macrocrystals]   . Statins   . Trimethoprim    Medications  Reviewed  DATA REVIEWED  Radiologic Exams:   Cardiovascular Exams:   Laboratory Studies  [facility]  02/23/10 Vit D 26.7                         TSH 5.4TC 211,                           TG 96, HDL 65, LDL 1115/17/12: TC 197, TG 90, HDL 61, LDL 118                         TSH .876  06/12/2012 BMP: Sodium 141, Potassium 4.6, glucose 89, BUN 25, Creatinine 1.00  TSH 0.34  01/10/2013 WBC 7.6., Hgb 14.5, Hct 42.6, Plt 331             Glucose 86, BUN 24, creatinine 0.88, sodium 142, potassium 4.4. Protein/LFTs WNL             Cholesterol 204, triglyceride 100, HDL 64, LDL 120             TSH 1.18  Review of Systems  DATA OBTAINED: from patient GENERAL: Feels well   No fevers, fatigue, change in appetite or weight SKIN: No itch, rash or open wounds EYES: No eye pain, dryness or itching  No change in vision EARS: No earache, tinnitus, change in hearing NOSE: No congestion, drainage or bleeding MOUTH/THROAT: No mouth or tooth pain    No sore throat     No difficulty chewing or swallowing RESPIRATORY:  No cough, wheezing, SOB CARDIAC: No chest pain,  No edema. GI: No abdominal pain  No N/V/D or constipation  No heartburn or reflux  GU: No dysuria,  No change in urine volume or character  urinary urgency and incontinence, see history of present illness   MUSCULOSKELETAL: No joint pain, swelling or stiffness  No back pain  Gait is steady  No recent falls. Bilateral thigh pain with walking, see history of present illness NEUROLOGIC: No dizziness, fainting, headache, imbalance, numbness  No change in mental status.  PSYCHIATRIC: No feelings of anxiety, depression   Sleeps well.      Physical Exam Filed Vitals:   08/28/13 0912  BP: 120/68  Pulse: 80  Height: 5\' 2"  (1.575 m)  Weight: 150 lb (68.04 kg)   Body mass index is 27.43 kg/(m^2).  GENERAL APPEARANCE: No acute distress, appropriately groomed, normal body habitus. Alert, pleasant, conversant. SKIN: No diaphoresis, rash, unusual lesions, wounds HEAD: Normocephalic, atraumatic EYES: Conjunctiva/lids clear. Pupils round, reactive.  EARS: External exam WNL, Hearing grossly normal. NOSE: No deformity or discharge. MOUTH/THROAT: Lips w/o lesions. Oral mucosa, tongue moist, w/o lesion. Oropharynx w/o redness or lesions.  NECK: Supple, full ROM. No thyroid tenderness, enlargement or nodule LYMPHATICS: No head, neck or supraclavicular adenopathy RESPIRATORY: Breathing is even, unlabored. Lung sounds are clear and full.  CARDIOVASCULAR: Heart RRR. No murmur or extra heart sounds  ARTERIAL: No palpable Femoral pulses, DP,PT pulse 2+ bilateral  VENOUS: No varicosities. No venous stasis skin changes  EDEMA: No peripheral  edema.  GASTROINTESTINAL: Abdomen is soft, non-tender, not distended w/ normal bowel sounds.  MUSCULOSKELETAL: Moves all extremities with full ROM, strength and tone. Back is without kyphosis, scoliosis or spinal process tenderness. Gait is steady NEUROLOGIC: Oriented to time, place, person.  PSYCHIATRIC: Mood and affect  appropriate to situation  ASSESSMENT/PLAN  Aortic atherosclerosis Identified on Aortic ultrasound exam 2008. If progressing, could contribute to thigh claudication   Bilateral claudication of lower limb Patient with symptoms consistent with claudication of bilateral posterior thighs. She walks well for short distances than thighs hurt, discomfort relieved with rest. Abdominal ultrasound in 2008 did show some aortic atherosclerosis. If this is progressing into aortoiliac vessels could contribute to claudication. Recommend further evaluation, patient agrees. Will schedule lower extremity arterial Doppler study  Hypertension Blood pressure remained stable off medications  Allergic asthma Symptoms are well controlled with current medications. Follow with Dr. Maple Hudson as scheduled  Urge incontinence Bladder incontinence this patient's main complaint. Discussed that Myrbetriq likely would work better if she took it consistently. Also had extensive discussion re: bladder training; voiding q.2 hours while awake will likely reduce  incontinence episodes during the day   Time: 40 minutes, >50% spent counseling/ care coordination  Follow up: 6 months  Lab: CBC, CMP, TSH, Lipids  Brihana Quickel T.Shamiah Kahler, NP-C 08/28/2013

## 2013-08-29 ENCOUNTER — Encounter: Payer: Self-pay | Admitting: Geriatric Medicine

## 2013-08-29 DIAGNOSIS — I739 Peripheral vascular disease, unspecified: Secondary | ICD-10-CM

## 2013-08-29 DIAGNOSIS — I700 Atherosclerosis of aorta: Secondary | ICD-10-CM | POA: Insufficient documentation

## 2013-08-29 HISTORY — DX: Peripheral vascular disease, unspecified: I73.9

## 2013-08-29 HISTORY — DX: Atherosclerosis of aorta: I70.0

## 2013-08-29 NOTE — Assessment & Plan Note (Signed)
Identified on Aortic ultrasound exam 2008. If progressing, could contribute to thigh claudication

## 2013-08-29 NOTE — Assessment & Plan Note (Signed)
Bladder incontinence this patient's main complaint. Discussed that Myrbetriq likely would work better if she took it consistently. Also had extensive discussion re: bladder training; voiding q.2 hours while awake will likely reduce incontinence episodes during the day

## 2013-08-29 NOTE — Assessment & Plan Note (Signed)
Symptoms are well controlled with current medications. Follow with Dr. Maple Hudson as scheduled

## 2013-08-29 NOTE — Assessment & Plan Note (Signed)
Blood pressure remained stable off medications

## 2013-08-29 NOTE — Assessment & Plan Note (Signed)
Patient with symptoms consistent with claudication of bilateral posterior thighs. She walks well for short distances than thighs hurt, discomfort relieved with rest. Abdominal ultrasound in 2008 did show some aortic atherosclerosis. If this is progressing into aortoiliac vessels could contribute to claudication. Recommend further evaluation, patient agrees. Will schedule lower extremity arterial Doppler study

## 2013-08-30 ENCOUNTER — Encounter: Payer: Self-pay | Admitting: Internal Medicine

## 2013-09-11 ENCOUNTER — Ambulatory Visit (HOSPITAL_COMMUNITY)
Admission: RE | Admit: 2013-09-11 | Discharge: 2013-09-11 | Disposition: A | Discharge status: Home / Self Care | Payer: Medicare Other | Source: Ambulatory Visit | Attending: Vascular Surgery | Admitting: Vascular Surgery

## 2013-09-11 ENCOUNTER — Other Ambulatory Visit: Payer: Self-pay | Admitting: Geriatric Medicine

## 2013-09-11 ENCOUNTER — Encounter (INDEPENDENT_AMBULATORY_CARE_PROVIDER_SITE_OTHER): Payer: Self-pay

## 2013-09-11 DIAGNOSIS — I739 Peripheral vascular disease, unspecified: Secondary | ICD-10-CM | POA: Insufficient documentation

## 2013-09-11 DIAGNOSIS — M79609 Pain in unspecified limb: Secondary | ICD-10-CM

## 2013-09-12 ENCOUNTER — Other Ambulatory Visit: Payer: Self-pay | Admitting: Geriatric Medicine

## 2013-09-12 ENCOUNTER — Other Ambulatory Visit (HOSPITAL_COMMUNITY): Payer: Self-pay

## 2013-09-12 DIAGNOSIS — I739 Peripheral vascular disease, unspecified: Secondary | ICD-10-CM

## 2013-09-12 DIAGNOSIS — M79609 Pain in unspecified limb: Secondary | ICD-10-CM

## 2013-09-13 ENCOUNTER — Other Ambulatory Visit: Payer: Self-pay | Admitting: Geriatric Medicine

## 2013-09-13 DIAGNOSIS — I739 Peripheral vascular disease, unspecified: Secondary | ICD-10-CM

## 2013-12-11 ENCOUNTER — Ambulatory Visit: Payer: Medicare Other | Admitting: Internal Medicine

## 2013-12-20 ENCOUNTER — Ambulatory Visit: Payer: Medicare Other | Admitting: Internal Medicine

## 2013-12-25 ENCOUNTER — Encounter: Payer: Self-pay | Admitting: Internal Medicine

## 2013-12-25 ENCOUNTER — Ambulatory Visit (INDEPENDENT_AMBULATORY_CARE_PROVIDER_SITE_OTHER): Payer: Medicare Other | Admitting: Internal Medicine

## 2013-12-25 ENCOUNTER — Ambulatory Visit (INDEPENDENT_AMBULATORY_CARE_PROVIDER_SITE_OTHER)
Admission: RE | Admit: 2013-12-25 | Discharge: 2013-12-25 | Disposition: A | Discharge status: Home / Self Care | Payer: Medicare Other | Source: Ambulatory Visit | Attending: Internal Medicine | Admitting: Internal Medicine

## 2013-12-25 VITALS — BP 120/66 | HR 72 | Ht 62.5 in | Wt 152.6 lb

## 2013-12-25 DIAGNOSIS — J330 Polyp of nasal cavity: Secondary | ICD-10-CM

## 2013-12-25 DIAGNOSIS — J45901 Unspecified asthma with (acute) exacerbation: Secondary | ICD-10-CM

## 2013-12-25 DIAGNOSIS — J45909 Unspecified asthma, uncomplicated: Secondary | ICD-10-CM

## 2013-12-25 MED ORDER — FLUTICASONE FUROATE-VILANTEROL 100-25 MCG/INH IN AEPB: INHALATION_SPRAY | RESPIRATORY_TRACT | Status: DC

## 2013-12-25 MED ORDER — FLUTICASONE FUROATE-VILANTEROL 100-25 MCG/INH IN AEPB: 1.0000 | INHALATION_SPRAY | Freq: Every day | RESPIRATORY_TRACT | Status: AC

## 2013-12-25 NOTE — Assessment & Plan Note (Signed)
Spacer prevented hoarseness from Symbicort, but she can't manipulate the device. We will try Breo. If insurance won't cover, it may be appropriate to see how she does without a maintenance device, before we look for insurance acceptable product.  Plan sample Breo to try instead of Symbicort

## 2013-12-25 NOTE — Progress Notes (Signed)
Patient ID: [name], female    DOB: Jan 26, 1924, 80 y.o.   MRN: [id number]  HPI 06/09/11- 53 year old never smoker followed for asthma and rhinitis with history of nasal polyp complicated by hypothyroidism. Last here November 24, 2010-note reviewed She had no problem through the spring and summer, using Advair HFA only once daily and she skipped while visiting in [place]. Does note sneeze and postnasal drip. Flonase insufficient.   12/14/11- 06/09/11- 17 year old never smoker followed for asthma and rhinitis with history of nasal polyp complicated by hypothyroidism. She has been using Symbicort once daily. She usually gets worse a few hours later. She did not need Symbicort until the cool weather began. We talked about observation during the warmer months, to see how being off Symbicort then will affect this hoarseness. . Currently she still notices wheezing if she tries to omit Symbicort for a few days. She is not recognizing significant postnasal drainage or reflux. She has been noticing some watery rhinorrhea and has developed a sore just at the edge of her left nostril.  12/12/12-  70 year old never smoker followed for asthma and rhinitis with history of nasal polyp complicated by hypothyroidism. Bad feet limit her walking and contribute to weight gain. Breathing is not limiting. She feels better off with Symbicort, continued one puff once daily. Blames it for making her hoarse. Humidifier in bedroom helps. Has had pneumonia vaccine in the past and we discussed current guidelines. Rhinitis is adequately controlled.  12/25/13- 58 year old never smoker followed for asthma and rhinitis with history of nasal polyp complicated by hypothyroidism FOLLOWS FOR: Denies any troubles other than when climbing stairs. Stable with good winter. Notices only that she has a little DOE w/ stairs in the last year. No acute. Symbicort makes her hoarse, relieved by Aerochamber. Now arthritis making it very hard  to work MDI, so needs different device, which won't work w/ spacer.   Review of Systems- see HPI Constitutional:   No-   weight loss, night sweats, fevers, chills, fatigue, lassitude. HEENT:   No-  headaches, difficulty swallowing, tooth/dental problems, sore throat,       No-  sneezing, itching, ear ache, nasal congestion, post nasal drip, + watery rhinorhea CV:  No-   chest pain, orthopnea, PND, swelling in lower extremities, anasarca, dizziness, palpitations Resp: +shortness of breath with exertion or at rest.              No-   productive cough,  No non-productive cough,  No-  coughing up of blood.              No-   change in color of mucus.  No- wheezing.   Skin: No-   rash or lesions. GI:  No-   heartburn, indigestion, abdominal pain, nausea, vomiting, d GU: MS:  +   joint pain or swelling.  Neuro-Nothing unusual  Psych:  No- change in mood or affect. No depression or anxiety.  No memory loss.  Objective:   Physical Exam General- Alert, Oriented, Affect-appropriate, Distress- none acute Skin- rash-none, lesions- none, excoriation- none Lymphadenopathy- none Head- atraumatic            Eyes- Gross vision intact, PERRLA, conjunctivae clear secretions            Ears- Hearing, canals normal            Nose- Clear, no-Septal dev, mucus, polyps, erosion, perforation.             Throat- Mallampati II ,  mucosa clear , drainage- none, tonsils- atrophic      Neck- flexible , trachea midline, no stridor , thyroid nl, carotid no bruit Chest - symmetrical excursion , unlabored           Heart/CV- RRR , no murmur , no gallop  , no rub, nl s1 s2                           - JVD- none , edema- none, stasis changes- none, varices- none           Lung- clear to P&A, wheeze- none, cough- none , dullness-none, rub- none           Chest wall-  Abd- Br/ Gen/ Rectal- Not done, not indicated Extrem- cyanosis- none, clubbing, none, atrophy- none, strength- nl Neuro- grossly intact to  observation

## 2013-12-25 NOTE — Assessment & Plan Note (Signed)
Polyps not evident now

## 2013-12-25 NOTE — Patient Instructions (Signed)
Sample and script to try Breo Ellipta  1 puff, then rinse mouth, one time daily     Try this instead of Symbicort, and price compare at the drug store if it seems to work well.  Order- CXR  Dx asthma

## 2013-12-26 ENCOUNTER — Telehealth: Payer: Self-pay

## 2013-12-26 NOTE — Telephone Encounter (Signed)
Pt aware of cxr results.  Nothing further needed. 

## 2013-12-26 NOTE — Telephone Encounter (Signed)
Message copied by Len Blalock on Thu Dec 26, 2013  4:19 PM ------      Message from: Laughlin, Tarri Fuller D      Created: Wed Dec 25, 2013  1:58 PM       CXR- clear without active heart or lung problem seen ------

## 2014-02-17 ENCOUNTER — Other Ambulatory Visit: Payer: Self-pay | Admitting: Internal Medicine

## 2014-02-20 LAB — HEPATIC FUNCTION PANEL
ALT: 16 U/L (ref 7–35)
AST: 22 U/L (ref 13–35)
Alkaline Phosphatase: 71 U/L (ref 25–125)
Bilirubin, Total: 0.7 mg/dL

## 2014-02-20 LAB — BASIC METABOLIC PANEL
BUN: 28 mg/dL — AB (ref 4–21)
Creatinine: 1.1 mg/dL (ref 0.5–1.1)
Glucose: 79 mg/dL
Potassium: 4.9 mmol/L (ref 3.4–5.3)
Sodium: 140 mmol/L (ref 137–147)

## 2014-02-20 LAB — LIPID PANEL
Cholesterol: 225 mg/dL — AB (ref 0–200)
HDL: 78 mg/dL — AB (ref 35–70)
LDL Cholesterol: 141 mg/dL
LDl/HDL Ratio: 1.8
TRIGLYCERIDES: 90 mg/dL (ref 40–160)

## 2014-02-20 LAB — CBC AND DIFFERENTIAL
HEMATOCRIT: 43 % (ref 36–46)
Hemoglobin: 14.4 g/dL (ref 12.0–16.0)
Platelets: 365 10*3/uL (ref 150–399)
WBC: 5.7 10^3/mL

## 2014-02-20 LAB — TSH: TSH: 2.22 u[IU]/mL (ref 0.41–5.90)

## 2014-02-21 HISTORY — PX: ROOT CANAL: SHX2363

## 2014-02-26 ENCOUNTER — Encounter: Payer: Self-pay | Admitting: Geriatric Medicine

## 2014-03-10 ENCOUNTER — Telehealth: Payer: Self-pay | Admitting: Internal Medicine

## 2014-03-10 MED ORDER — ALBUTEROL SULFATE HFA 108 (90 BASE) MCG/ACT IN AERS: 2.0000 | INHALATION_SPRAY | Freq: Four times a day (QID) | RESPIRATORY_TRACT | Status: DC | PRN

## 2014-03-10 MED ORDER — BUDESONIDE-FORMOTEROL FUMARATE 160-4.5 MCG/ACT IN AERO: INHALATION_SPRAY | RESPIRATORY_TRACT | Status: DC

## 2014-03-10 NOTE — Telephone Encounter (Signed)
Ok to refill the Symbicort she had before, 2 puffs then rinse mouth, twice daily  Refill prn Ok to refill the albuterol rescue inhaler  2 puffs every 4 hours, if needed, refill prn

## 2014-03-10 NOTE — Telephone Encounter (Signed)
Called spoke with pt. She reports she did not like the breo. She prefers to stay on symbicort if this is okay by Dr. Annamaria Boots. She never compared prices. Also she wants refill sent on her albuterol inhaler as well. Please advise Dr. Annamaria Boots thanks [facility] [address].  Allergies  Allergen Reactions  . Sulfa Antibiotics   . Chlorine   . Codeine   . Macrobid [Nitrofurantoin Monohydrate Macrocrystals]   . Statins   . Trimethoprim      Current Outpatient Prescriptions on File Prior to Visit  Medication Sig Dispense Refill  . albuterol (PROAIR HFA) 108 (90 BASE) MCG/ACT inhaler Inhale 2 puffs into the lungs 4 (four) times daily as needed.  1 Inhaler  prn  . aspirin 81 MG tablet Take 81 mg by mouth daily.        . Fluticasone Furoate-Vilanterol 100-25 MCG/INH AEPB 1 puff then rise mouth, one time daily  1 each  prn  . ibuprofen (ADVIL,MOTRIN) 200 MG tablet Take 200 mg by mouth. Take two tablet daily as needed for pain      . levothyroxine (SYNTHROID, LEVOTHROID) 75 MCG tablet TAKE 1 TABLET BY MOUTH EVERY MORNING  90 tablet  0  . loratadine (CLARITIN) 10 MG tablet Take 10 mg by mouth daily.      Marland Kitchen MYRBETRIQ 50 MG TB24 tablet Take one tablet in evening for bladder      . NON FORMULARY Take 500 mg by mouth daily. MANGOSTEEN ANTIOXIDANT      . Probiotic Product (PROBIOTIC DAILY PO) Take by mouth. Take one daily to protect colon while taking antibiotic      . SYMBICORT 160-4.5 MCG/ACT inhaler USE 2 PUFFS TWICE DAILY, RINSE WELL AFTER EACH USE  1 Inhaler  6   No current facility-administered medications on file prior to visit.

## 2014-03-10 NOTE — Telephone Encounter (Signed)
Per OV 12/25/2013: Patient Instructions      Sample and script to try Breo Ellipta  1 puff, then rinse mouth, one time daily     Try this instead of Symbicort, and price compare at the drug store if it seems to work well.  Order- CXR  Dx asthma  --  LMTCB x1.

## 2014-03-10 NOTE — Telephone Encounter (Signed)
Spoke with pt and advised of Dr Janee Morn recommendations.  Rx refills sent to pharmacy

## 2014-03-10 NOTE — Telephone Encounter (Signed)
Pt returned call

## 2014-03-24 ENCOUNTER — Encounter: Payer: Self-pay | Admitting: Internal Medicine

## 2014-05-07 ENCOUNTER — Other Ambulatory Visit: Payer: Self-pay

## 2014-05-07 ENCOUNTER — Non-Acute Institutional Stay: Payer: Medicare Other | Admitting: Nurse Practitioner

## 2014-05-07 ENCOUNTER — Encounter: Payer: Self-pay | Admitting: Nurse Practitioner

## 2014-05-07 VITALS — BP 120/82 | HR 72 | Temp 97.5°F | Ht 62.25 in | Wt 151.0 lb

## 2014-05-07 DIAGNOSIS — N3941 Urge incontinence: Secondary | ICD-10-CM

## 2014-05-07 DIAGNOSIS — E785 Hyperlipidemia, unspecified: Secondary | ICD-10-CM

## 2014-05-07 DIAGNOSIS — J45909 Unspecified asthma, uncomplicated: Secondary | ICD-10-CM

## 2014-05-07 DIAGNOSIS — E039 Hypothyroidism, unspecified: Secondary | ICD-10-CM

## 2014-05-07 DIAGNOSIS — J4520 Mild intermittent asthma, uncomplicated: Secondary | ICD-10-CM

## 2014-05-07 DIAGNOSIS — I10 Essential (primary) hypertension: Secondary | ICD-10-CM

## 2014-05-07 NOTE — Progress Notes (Signed)
Patient ID: [name], female   DOB: Mar 01, 1924, 80 y.o.   MRN: [id number]    Nursing Home Location:  [place] of Service: Clinic (12)  PCP: Estill Dooms, MD  Allergies  Allergen Reactions  . Sulfa Antibiotics   . Chlorine   . Codeine   . Macrobid [Nitrofurantoin Monohydrate Macrocrystals]   . Statins   . Trimethoprim     Chief Complaint  Patient presents with  . Medical Management of Chronic Issues    Comprehensive exam: blood pressure, cholesterol, thyroid, asthma    HPI:  This 80 year old female resident of [facility], independent living section, presents to clinic today for her annual exam. Pt with a pmh of allergic asthma, hypertension, hyperthyroid, urge incontinence and low back pain. Pt reports she does not wish to have any more colonoscopies. Will get routine mammogram, due for this. Needs to go see the dentist and eye doctor. Plans to make these appts. Pt conts to follow up with Dr Annamaria Boots for allergic asthma and Dr Jeffie Pollock for urinary incontinence. Reports she has been doing well and has no complaints. Labs reviewed with pt and she reports she is aware she needs to work on diet. Does not need on-going education towards this just needs to implement it.  Reports she exercises 4 times a week and remains very active on a daily bases.      Review of Systems:  Review of Systems  Constitutional: Negative for fever, chills and malaise/fatigue.  HENT: Positive for congestion (at times, has chronic allergies) and hearing loss (needs to see someone about his however plans to see who her friends recommend because she knows she needs hearing aids).   Eyes: Negative for blurred vision.  Respiratory: Negative for cough and shortness of breath.   Cardiovascular: Negative for chest pain, palpitations, orthopnea and leg swelling.  Gastrointestinal: Negative for heartburn, nausea, vomiting, abdominal pain, diarrhea and  constipation.  Genitourinary: Positive for frequency (with incontience). Negative for dysuria and urgency.  Musculoskeletal: Positive for back pain (occasionally). Negative for myalgias and neck pain.  Skin: Negative.  Negative for itching and rash.  Neurological: Negative for dizziness, sensory change, speech change, weakness and headaches.  Endo/Heme/Allergies: Positive for environmental allergies. Does not bruise/bleed easily.  Psychiatric/Behavioral: Negative for depression and memory loss. The patient is not nervous/anxious and does not have insomnia.      Past Medical History  Diagnosis Date  . Nasal polyps   . Osteoporosis   . Mitral valve disorders   . Chronic rhinitis   . Unspecified urinary incontinence   . Asthma   . Hyperlipidemia   . Hypertension   . Urge incontinence   . Unspecified hypothyroidism   . Bilateral claudication of lower limb 08/29/2013    Bilateral posterior thighs  . Aortic atherosclerosis 08/29/2013    Identified on Aortic ultrasound exam 2008    Past Surgical History  Procedure Laterality Date  . Nasal polyp surgery  03/2002    x2 Dr Wilburn Cornelia  . Tonsillectomy    . Total abdominal hysterectomy w/ bilateral salpingoophorectomy  1970's  . Bunionectomy Left 12/1998  . Milk cyst right breast    . Breast lumpectomy Right 1953  . Bunionectomy Right 02/2000  . Eye surgery Bilateral 02/2000    Cataract removal  . Root canal  02/2014   Social History:   reports that she has never smoked. She has never used smokeless tobacco. She reports that she drinks about 1.2 ounces  of alcohol per week. She reports that she does not use illicit drugs.  Family History  Problem Relation Age of Onset  . Diabetes Mother     Adult onset  . Arthritis Mother   . Breast cancer Mother 46  . Heart attack Father   . Diabetes Father     Adult onset  . Heart disease Father   . Heart Problems Father     Arrhythmia requiring pacemaker  . Diabetes type II      sibling  .  Allergies      1/2 sister(father's side)  . Diabetes Brother   . Fibromyalgia Daughter     Medications: Patient's Medications  New Prescriptions   No medications on file  Previous Medications   ALBUTEROL (PROAIR HFA) 108 (90 BASE) MCG/ACT INHALER    Inhale 2 puffs into the lungs 4 (four) times daily as needed.   BIOTIN 3 MG TABS    Take by mouth. Take one tablet hair, skin, nails   IBUPROFEN (ADVIL,MOTRIN) 200 MG TABLET    Take 200 mg by mouth. Take two tablet daily as needed for pain   LEVOTHYROXINE (SYNTHROID, LEVOTHROID) 75 MCG TABLET    TAKE 1 TABLET BY MOUTH EVERY MORNING   MYRBETRIQ 50 MG TB24 TABLET    Take one tablet in evening for bladder   NON FORMULARY    Take 500 mg by mouth daily. MANGOSTEEN ANTIOXIDANT   PROBIOTIC PRODUCT (PROBIOTIC DAILY PO)    Take by mouth. Take one daily to protect colon while taking antibiotic   TRIMETHOPRIM (TRIMPEX) 100 MG TABLET    Take one tablet daily for UTI  Modified Medications   Modified Medication Previous Medication   BUDESONIDE-FORMOTEROL (SYMBICORT) 160-4.5 MCG/ACT INHALER budesonide-formoterol (SYMBICORT) 160-4.5 MCG/ACT inhaler      USE 2 PUFFS ONCE DAILY, RINSE WELL AFTER EACH USE    USE 2 PUFFS TWICE DAILY, RINSE WELL AFTER EACH USE  Discontinued Medications   ASPIRIN 81 MG TABLET    Take 81 mg by mouth daily.     FLUTICASONE FUROATE-VILANTEROL 100-25 MCG/INH AEPB    1 puff then rise mouth, one time daily   LORATADINE (CLARITIN) 10 MG TABLET    Take 10 mg by mouth daily.     Physical Exam: GENERAL APPEARANCE: No acute distress, appropriately groomed, normal body habitus. Alert, pleasant, conversant. HEAD: Normocephalic, atraumatic EYES: Conjunctiva/lids clear. Pupils round, reactive. EOMs intact.   EARS: External exam WNL, canals clear, TM WNL. Hearing grossly normal during exam. NOSE: No deformity or discharge. MOUTH/THROAT: Lips w/o lesions. Oral mucosa, tongue moist, w/o lesion. Oropharynx w/o redness or lesions.   NECK:  Supple, full ROM. No thyroid tenderness, enlargement or nodule LYMPHATICS: No head, neck or supraclavicular adenopathy RESPIRATORY: Breathing is even, unlabored. Lung sounds are diminished but clear throughout.   CARDIOVASCULAR: Heart RRR. No murmur or extra heart sounds             ARTERIAL: pedal pulses +2             VENOUS: No varicosities. No venous stasis skin changes             EDEMA: No peripheral or periorbital edema.   GASTROINTESTINAL: Abdomen is soft, non-tender, not distended w/ normal bowel sounds. No hepatic or splenic enlargement. No mass, ventral or inguinal hernia. GENITOURINARY: Bladder non tender, not distended. MUSCULOSKELETAL: Moves all extremities with full ROM, strength and tone. Back is without kyphosis, scoliosis or spinal process tenderness. Gait is steady. NEUROLOGIC:  Oriented to time, place, person. Cranial nerves 2-12 grossly intact, speech clear, no tremor. PSYCHIATRIC: Mood and affect appropriate to situation  Filed Vitals:   05/07/14 1002  BP: 120/82  Pulse: 72  Temp: 97.5 F (36.4 C)  TempSrc: Oral  Height: 5' 2.25" (1.581 m)  Weight: 151 lb (68.493 kg)  SpO2: 98%      Labs reviewed: Basic Metabolic Panel:  Recent Labs  02/20/14  NA 140  K 4.9  BUN 28*  CREATININE 1.1   Liver Function Tests:  Recent Labs  02/20/14  AST 22  ALT 16  ALKPHOS 71   No results found for this basename: LIPASE, AMYLASE,  in the last 8760 hours No results found for this basename: AMMONIA,  in the last 8760 hours CBC:  Recent Labs  02/20/14  WBC 5.7  HGB 14.4  HCT 43  PLT 365   Cardiac Enzymes: No results found for this basename: CKTOTAL, CKMB, CKMBINDEX, TROPONINI,  in the last 8760 hours BNP: No components found with this basename: POCBNP,  CBG: No results found for this basename: GLUCAP,  in the last 8760 hours TSH:  Recent Labs  02/20/14  TSH 2.22   A1C: No results found for this basename: HGBA1C   Lipid Panel:  Recent Labs   02/20/14  CHOL 225*  HDL 78*  LDLCALC 141  TRIG 90      Assessment/Plan 1. Essential hypertension -remains stable off medication   2. Unspecified hypothyroidism -will cont current dose of synthroid  3. Allergic asthma, mild intermittent, uncomplicated - has been stable, using symicort once daily and not needing albuterol  4. Hyperlipidemia -plans to work on diet, conts exercise -will follow up fasting lipids before next visit  5. Urge incontinence -conts to follow up with Dr Jeffie Pollock, does not feel like Edyth Gunnels is working -could trail off to see if there is any difference   6. ROUTINE GENERAL MEDICAL EXAMINATION AT HEALTH CARE   Assessment/PLAN The patient is doing well and no distinct problems were identified on exam. PREVENTIVE COUNSELING:  The patient was counseled regarding the appropriate use of alcohol, regular self-examination of the breasts on a monthly basis, prevention of dental and periodontal disease, diet, regular sustained exercise for at least 30 minutes 5 times per week, routine screening interval for mammogram as recommended by the [place] and ACOG, importance of regular PAP smears, the proper use of sunscreen and protective clothing, tobacco use,  and recommended schedule for GI hemoccult testing, colonoscopy, cholesterol, thyroid and diabetes screening. -pt needing further work up for hearing loss, if needed will place referral at that time

## 2014-05-07 NOTE — Telephone Encounter (Signed)
Called patient about calcium. [name] reviewed DEXA from 01/11/13 and notice she didn't have calcium on medication list. Take Calcium 1200/Vit D 800. Patient will get some tomorrow.

## 2014-05-14 ENCOUNTER — Other Ambulatory Visit: Payer: Self-pay | Admitting: Internal Medicine

## 2014-11-04 LAB — BASIC METABOLIC PANEL
BUN: 22 mg/dL — AB (ref 4–21)
CREATININE: 0.9 mg/dL (ref 0.5–1.1)
Glucose: 80 mg/dL
POTASSIUM: 4.6 mmol/L (ref 3.4–5.3)
Sodium: 142 mmol/L (ref 137–147)

## 2014-11-07 ENCOUNTER — Other Ambulatory Visit: Payer: Self-pay | Admitting: Nurse Practitioner

## 2014-11-10 ENCOUNTER — Encounter: Payer: Self-pay | Admitting: Internal Medicine

## 2014-11-17 ENCOUNTER — Encounter: Payer: Self-pay | Admitting: Internal Medicine

## 2014-11-18 ENCOUNTER — Non-Acute Institutional Stay: Payer: Medicare Other | Admitting: Internal Medicine

## 2014-11-18 ENCOUNTER — Encounter: Payer: Self-pay | Admitting: Internal Medicine

## 2014-11-18 VITALS — BP 128/74 | HR 84 | Temp 97.5°F | Resp 20 | Wt 153.0 lb

## 2014-11-18 DIAGNOSIS — J4520 Mild intermittent asthma, uncomplicated: Secondary | ICD-10-CM

## 2014-11-18 DIAGNOSIS — L259 Unspecified contact dermatitis, unspecified cause: Secondary | ICD-10-CM

## 2014-11-18 DIAGNOSIS — I10 Essential (primary) hypertension: Secondary | ICD-10-CM

## 2014-11-18 DIAGNOSIS — E039 Hypothyroidism, unspecified: Secondary | ICD-10-CM

## 2014-11-18 DIAGNOSIS — E785 Hyperlipidemia, unspecified: Secondary | ICD-10-CM

## 2014-11-18 DIAGNOSIS — N3941 Urge incontinence: Secondary | ICD-10-CM

## 2014-11-18 DIAGNOSIS — Z23 Encounter for immunization: Secondary | ICD-10-CM

## 2014-11-18 MED ORDER — PREDNISONE 10 MG PO TABS: ORAL_TABLET | ORAL | Status: DC

## 2014-11-18 MED ORDER — ZOSTER VACCINE LIVE 19400 UNT/0.65ML ~~LOC~~ SOLR: 0.6500 mL | Freq: Once | SUBCUTANEOUS | Status: DC

## 2014-11-18 NOTE — Progress Notes (Signed)
Patient ID: [name], female   DOB: Nov 02, 1923, 81 y.o.   MRN: [id number]   Location:  [facility]  Code Status: DNR, has hcpoa and living will   Allergies  Allergen Reactions  . Sulfa Antibiotics   . Chlorine   . Codeine   . Macrobid [Nitrofurantoin Monohydrate Macrocrystals]   . Statins   . Trimethoprim     Chief Complaint  Patient presents with  . Medical Management of Chronic Issues    6 month follow-up blood pressure, thyroid    HPI: Patient is a 81 y.o. white female seen in the office today for med mgt chronic diseases.    Her big concern today is a rash.    Has an allergy history and multiple prior rashes and reactions.  Hoarseness comes and goes with her asthma.    Rash began on her chest in September.  Saw a dermatologist, Dr. Fontaine No.  Rash got worse after using triamcinolone 0.1% cream.  Has not changed her lotions, soaps, detergents b/c she knows she can have these reactions.    Had a root canal on her back right tooth in summer by Dr. Dorothyann Peng.  Then went back to regular dentist and got crown put on, she had problems since.  Having pressure from the crown.  Now rash going up neck some.    Sees Dr. Annamaria Boots as pulmonologist and allergist.    Sees Dr. Jeffie Pollock about her bladder leakage.  Was getting treatments.  Had a surgery w/o benefit.  Wears pad 24 hrs per day.  Myrbetriq does help a bit.  Loss of muscles.    Says everything hurts a little bit occasionally (back and knees mostly)--rarely uses the ibuprofen.  Goes to exercise class 2x per week.  No longer walks like she did outdoors.    In terms of mammograms, she does not want chemo or xrt ever, but agrees she would have surgery.  Agrees to set up mammogram herself.    Her children know what her wishes are.  Has living will and hcpoa.  Does request DNR status.    Review of Systems:  Review of Systems  Constitutional: Negative for fever and chills.  Eyes: Negative  for blurred vision.  Gastrointestinal: Negative for abdominal pain and constipation.  Genitourinary: Positive for urgency and frequency. Negative for dysuria.  Musculoskeletal: Positive for back pain and joint pain. Negative for falls.  Skin: Positive for itching and rash.  Neurological: Negative for dizziness and headaches.  Endo/Heme/Allergies: Positive for environmental allergies.  Psychiatric/Behavioral: Negative for memory loss.    Past Medical History  Diagnosis Date  . Nasal polyps   . Osteoporosis   . Mitral valve disorders   . Chronic rhinitis   . Unspecified urinary incontinence   . Asthma   . Hyperlipidemia   . Hypertension   . Urge incontinence   . Unspecified hypothyroidism   . Bilateral claudication of lower limb 08/29/2013    Bilateral posterior thighs  . Aortic atherosclerosis 08/29/2013    Identified on Aortic ultrasound exam 2008     Past Surgical History  Procedure Laterality Date  . Nasal polyp surgery  03/2002    x2 Dr Wilburn Cornelia  . Tonsillectomy    . Total abdominal hysterectomy w/ bilateral salpingoophorectomy  1970's  . Bunionectomy Left 12/1998  . Milk cyst right breast    . Breast lumpectomy Right 1953  . Bunionectomy Right 02/2000  . Eye surgery Bilateral 02/2000  Cataract removal  . Root canal  02/2014    Social History:   reports that she has never smoked. She has never used smokeless tobacco. She reports that she drinks about 1.2 oz of alcohol per week. She reports that she does not use illicit drugs.  Family History  Problem Relation Age of Onset  . Diabetes Mother     Adult onset  . Arthritis Mother   . Breast cancer Mother 74  . Heart attack Father   . Diabetes Father     Adult onset  . Heart disease Father   . Heart Problems Father     Arrhythmia requiring pacemaker  . Diabetes type II      sibling  . Allergies      1/2 sister(father's side)  . Diabetes Brother   . Fibromyalgia Daughter     Medications: Patient's  Medications  New Prescriptions   No medications on file  Previous Medications   ALBUTEROL (PROAIR HFA) 108 (90 BASE) MCG/ACT INHALER    Inhale 2 puffs into the lungs 4 (four) times daily as needed.   BIOTIN 3 MG TABS    Take by mouth. Take one tablet hair, skin, nails   BUDESONIDE-FORMOTEROL (SYMBICORT) 160-4.5 MCG/ACT INHALER    USE 2 PUFFS ONCE DAILY, RINSE WELL AFTER EACH USE   IBUPROFEN (ADVIL,MOTRIN) 200 MG TABLET    Take 200 mg by mouth. Take two tablet daily as needed for pain   LEVOTHYROXINE (SYNTHROID, LEVOTHROID) 75 MCG TABLET    TAKE 1 TABLET BY MOUTH EVERY MORNING   MYRBETRIQ 50 MG TB24 TABLET    Take one tablet in evening for bladder   NON FORMULARY    Take 500 mg by mouth daily. MANGOSTEEN ANTIOXIDANT   PROBIOTIC PRODUCT (PROBIOTIC DAILY PO)    Take by mouth. Take one daily to protect colon while taking antibiotic  Modified Medications   No medications on file  Discontinued Medications   TRIMETHOPRIM (TRIMPEX) 100 MG TABLET    Take one tablet daily for UTI   Physical Exam: Filed Vitals:   11/18/14 1346  BP: 128/74  Pulse: 84  Temp: 97.5 F (36.4 C)  TempSrc: Oral  Resp: 20  Weight: 153 lb (69.4 kg)  SpO2: 98%  Physical Exam  Constitutional: She is oriented to person, place, and time. She appears well-developed and well-nourished. No distress.  Cardiovascular: Normal rate and regular rhythm.   Murmur heard. Pulmonary/Chest: Effort normal and breath sounds normal.  Abdominal: Soft. Bowel sounds are normal.  Musculoskeletal: Normal range of motion.  Neurological: She is alert and oriented to person, place, and time.  Skin: Skin is warm and dry. Rash noted.  Papular rash over chest, back, evidence of excoriation, is raised  Psychiatric: She has a normal mood and affect.    Labs reviewed: Basic Metabolic Panel:  Recent Labs  02/20/14 11/04/14  NA 140 142  K 4.9 4.6  BUN 28* 22*  CREATININE 1.1 0.9  TSH 2.22  --    Liver Function Tests:  Recent Labs   02/20/14  AST 22  ALT 16  ALKPHOS 71   No results for input(s): LIPASE, AMYLASE in the last 8760 hours. No results for input(s): AMMONIA in the last 8760 hours. CBC:  Recent Labs  02/20/14  WBC 5.7  HGB 14.4  HCT 43  PLT 365   Lipid Panel:  Recent Labs  02/20/14  CHOL 225*  HDL 78*  LDLCALC 141  TRIG 90   Assessment/Plan 1.  Contact dermatitis - pt has been suffering with this for months and just now reported it to [place] -did see derm w/ ? Of actual worsening with the triamcinolone instead of improvement - predniSONE (DELTASONE) 10 MG tablet; 6 tabs 1/26, 5 tabs 1/27, 4 tabs 1/28, 3 tabs 1/29, 2 tabs 1/30, 1 tab 1/31, then stop  Dispense: 21 tablet; Refill: 0 -she was told that if prednisone makes her worse, she should stop it immediately and call me -she had requested oral therapy due to difficulty with applying a topical agent--side effects of oral prednisone were reviewed with her  2. Allergic asthma, mild intermittent, uncomplicated -cont albuterol prn, symbicort  3. Urge incontinence -cont myrbetriq 50mg  daily and reviewed kegels  4. Essential hypertension -bp at goal without medications  5. Hypothyroidism, unspecified hypothyroidism type -cont synthroid 53mcg daily, f/u tsh before next visit  6. Hyperlipidemia -not on medication for this, trying to follow diet to some degree, f/u flp before annual exam  7. Need for vaccination with 13-polyvalent pneumococcal conjugate vaccine -prevnar given  8. Need for Zostavax administration - zoster vaccine live, PF, (ZOSTAVAX) 94801 UNT/0.65ML injection; Inject 19,400 Units into the skin once.  Dispense: 1 each; Refill: 0--prescription provided  Labs/tests ordered:  Cbc, cmp, flp, tsh before Next appt:  6 mos annual exam  Tykisha Areola L. Sakib Noguez, D.O. [facility] [address], [place] [address] Cell Phone (Mon-Fri 8am-5pm):  [phone] On Call:  [phone] & follow  prompts after 5pm & weekends Office Phone:  [phone] Office Fax:  [phone]

## 2014-11-26 ENCOUNTER — Encounter: Payer: Self-pay | Admitting: Internal Medicine

## 2014-12-15 ENCOUNTER — Telehealth: Payer: Self-pay | Admitting: Internal Medicine

## 2014-12-15 NOTE — Telephone Encounter (Signed)
Called and spoke to pt. Pt stated the cream is called Kenalog 0.1% and pt is no longer taking because it did not help at all. Pt stated she was given a 6 day pred taper by PCP and it helped at the time but once taper was completed the rash returned at the same severity. Pt stated she has no idea what could be causing the rash.   Pt is requesting we call back after 2:15.

## 2014-12-15 NOTE — Telephone Encounter (Signed)
Our usual advice is for her to work with the dermatologist who has seen her. She needs to tell him the initial treatment didn't work for her. Prednisone may be masking something, but not curing it. I would need to see the rash before making any treatment suggestion myself.

## 2014-12-15 NOTE — Telephone Encounter (Signed)
Called and spoke to pt. Pt c/o chronic rash she has been having since august. Pt stated the rash is raised, red and itchy on torso and back and is varies in severity but has been constant since August. Pt stated she saw dermatology in September and December and was given a cream that has not helped. Pt denies SOB, cough, CP/tightness, angioedema. Pt stated she has tried changing detergents, soaps, etc with no relief. Pt is requesting recs, if possible today. Pt has f/u on 12/31/14.   CY please advise.

## 2014-12-15 NOTE — Telephone Encounter (Signed)
Does she know what the dermatologist called it, and what kind of medicines she was given for it that didn't work ? What does SHE think might be causing it?

## 2014-12-15 NOTE — Telephone Encounter (Signed)
Spoke with pt, advised her of cy's recs.  She understands.  Nothing further needed.

## 2014-12-31 ENCOUNTER — Encounter: Payer: Self-pay | Admitting: Internal Medicine

## 2014-12-31 ENCOUNTER — Ambulatory Visit (INDEPENDENT_AMBULATORY_CARE_PROVIDER_SITE_OTHER): Payer: Medicare Other | Admitting: Internal Medicine

## 2014-12-31 VITALS — BP 118/62 | HR 70 | Ht 62.5 in | Wt 155.0 lb

## 2014-12-31 DIAGNOSIS — J4520 Mild intermittent asthma, uncomplicated: Secondary | ICD-10-CM

## 2014-12-31 DIAGNOSIS — J310 Chronic rhinitis: Secondary | ICD-10-CM | POA: Diagnosis not present

## 2014-12-31 NOTE — Assessment & Plan Note (Signed)
Mild intermittent asthma, well controlled. Okay to try off of her maintenance Symbicort inhaler. She has a rescue inhaler if needed. Medication education done.

## 2014-12-31 NOTE — Assessment & Plan Note (Signed)
Sufficient to use occasional OTC antihistamine if needed

## 2014-12-31 NOTE — Patient Instructions (Signed)
Ok to stay off Symbicort and see how you do  Please call as needed

## 2014-12-31 NOTE — Progress Notes (Signed)
Patient ID: [name], female    DOB: 10-Jan-1924, 81 y.o.   MRN: [id number]  HPI 06/09/11- 52 year old never smoker followed for asthma and rhinitis with history of nasal polyp complicated by hypothyroidism. Last here November 24, 2010-note reviewed She had no problem through the spring and summer, using Advair HFA only once daily and she skipped while visiting in [place]. Does note sneeze and postnasal drip. Flonase insufficient.   12/14/11- 06/09/11- 25 year old never smoker followed for asthma and rhinitis with history of nasal polyp complicated by hypothyroidism. She has been using Symbicort once daily. She usually gets worse a few hours later. She did not need Symbicort until the cool weather began. We talked about observation during the warmer months, to see how being off Symbicort then will affect this hoarseness. . Currently she still notices wheezing if she tries to omit Symbicort for a few days. She is not recognizing significant postnasal drainage or reflux. She has been noticing some watery rhinorrhea and has developed a sore just at the edge of her left nostril.  12/12/12-  77 year old never smoker followed for asthma and rhinitis with history of nasal polyp complicated by hypothyroidism. Bad feet limit her walking and contribute to weight gain. Breathing is not limiting. She feels better off with Symbicort, continued one puff once daily. Blames it for making her hoarse. Humidifier in bedroom helps. Has had pneumonia vaccine in the past and we discussed current guidelines. Rhinitis is adequately controlled.  12/25/13- 68 year old never smoker followed for asthma and rhinitis with history of nasal polyp complicated by hypothyroidism FOLLOWS FOR: Denies any troubles other than when climbing stairs. Stable with good winter. Notices only that she has a little DOE w/ stairs in the last year. No acute. Symbicort makes her hoarse, relieved by Aerochamber. Now arthritis making it very hard  to work MDI, so needs different device, which won't work w/ spacer.   12/31/14- 21 year old never smoker followed for asthma and rhinitis with history of nasal polyp complicated by hypothyroidism no problems with asthma at this time. She has been doing very well and so she never wheezes. In the last few days she has been off of Symbicort with no problems. She reports problems with a persistent rash from last summer. Treated by dermatology and the rash has resolved using a steroid cream. CXR 12/25/13 IMPRESSION: No active cardiopulmonary disease. Electronically Signed  By: Sabino Dick M.D.  On: 12/25/2013 13:03  Review of Systems- see HPI Constitutional:   No-   weight loss, night sweats, fevers, chills, fatigue, lassitude. HEENT:   No-  headaches, difficulty swallowing, tooth/dental problems, sore throat,       No-  sneezing, itching, ear ache, nasal congestion, post nasal drip, + watery rhinorhea CV:  No-   chest pain, orthopnea, PND, swelling in lower extremities, anasarca, dizziness, palpitations Resp: +shortness of breath with exertion or at rest.              No-   productive cough,  No non-productive cough,  No-  coughing up of blood.              No-   change in color of mucus.  No- wheezing.   Skin: No-   rash or lesions. GI:  No-   heartburn, indigestion, abdominal pain, nausea, vomiting, d GU: MS:  +   joint pain or swelling.  Neuro-Nothing unusual  Psych:  No- change in mood or affect. No depression or anxiety.  No memory loss.  Objective:   Physical Exam General- Alert, Oriented, Affect-appropriate, Distress- none acute Skin- rash-none, lesions- none, excoriation- none Lymphadenopathy- none Head- atraumatic            Eyes- Gross vision intact, PERRLA, conjunctivae clear secretions            Ears- Hearing, canals normal            Nose- Clear, no-Septal dev, mucus, polyps, erosion, perforation.             Throat- Mallampati II , mucosa clear , drainage- none,  tonsils- atrophic      Neck- flexible , trachea midline, no stridor , thyroid nl, carotid no bruit Chest - symmetrical excursion , unlabored           Heart/CV- RRR , no murmur , no gallop  , no rub, nl s1 s2                           - JVD- none , edema- none, stasis changes- none, varices- none           Lung- clear to P&A, wheeze- none, cough- none , dullness-none, rub- none           Chest wall-  Abd- Br/ Gen/ Rectal- Not done, not indicated Extrem- cyanosis- none, clubbing, none, atrophy- none, strength- nl Neuro- grossly intact to observation

## 2015-02-09 ENCOUNTER — Other Ambulatory Visit: Payer: Self-pay | Admitting: Internal Medicine

## 2015-02-18 ENCOUNTER — Other Ambulatory Visit: Payer: Self-pay | Admitting: Dermatology

## 2015-02-22 DIAGNOSIS — L120 Bullous pemphigoid: Secondary | ICD-10-CM

## 2015-02-22 HISTORY — DX: Bullous pemphigoid: L12.0

## 2015-03-25 LAB — HM DEXA SCAN

## 2015-03-31 ENCOUNTER — Encounter: Payer: Self-pay | Admitting: *Deleted

## 2015-05-11 ENCOUNTER — Telehealth: Payer: Self-pay | Admitting: Internal Medicine

## 2015-05-11 NOTE — Telephone Encounter (Signed)
Called and spoke to pt. Informed her of the recs per CY. Pt verbalized understanding and denied any further questions or concerns at this time.   

## 2015-05-11 NOTE — Telephone Encounter (Signed)
Spoke with pt. States that she was having issues breathing yesterday. Reports that she was having shallow breathing yesterday while at lunch. The nurse at [facility] checked her out and did not find anything wrong. She denies chest tightness, coughing and wheezing. Would like CY's recommendations.  Allergies  Allergen Reactions  . Sulfa Antibiotics   . Chlorine   . Codeine   . Macrobid [Nitrofurantoin Monohydrate Macrocrystals]   . Statins   . Trimethoprim     Current Outpatient Prescriptions on File Prior to Visit  Medication Sig Dispense Refill  . albuterol (PROAIR HFA) 108 (90 BASE) MCG/ACT inhaler Inhale 2 puffs into the lungs 4 (four) times daily as needed. 1 Inhaler prn  . budesonide-formoterol (SYMBICORT) 160-4.5 MCG/ACT inhaler USE 2 PUFFS ONCE DAILY, RINSE WELL AFTER EACH USE    . cholecalciferol (VITAMIN D) 1000 UNITS tablet Take 2,000 Units by mouth daily.    Marland Kitchen ibuprofen (ADVIL,MOTRIN) 200 MG tablet Take 200 mg by mouth. Take two tablet daily as needed for pain    . levothyroxine (SYNTHROID, LEVOTHROID) 75 MCG tablet TAKE 1 TABLET BY MOUTH EVERY MORNING 90 tablet 0  . MYRBETRIQ 50 MG TB24 tablet Take one tablet in evening for bladder    . Probiotic Product (PROBIOTIC DAILY PO) Take by mouth. Take one daily to protect colon while taking antibiotic    . zoster vaccine live, PF, (ZOSTAVAX) 46568 UNT/0.65ML injection Inject 19,400 Units into the skin once. 1 each 0   No current facility-administered medications on file prior to visit.    CY - please advise. Thanks.

## 2015-05-11 NOTE — Telephone Encounter (Signed)
If she is ok today and nurse there doesn't find anything wrong, then suggest we just watch for now.

## 2015-05-12 ENCOUNTER — Other Ambulatory Visit: Payer: Self-pay | Admitting: Internal Medicine

## 2015-06-09 LAB — LIPID PANEL
Cholesterol: 188 mg/dL (ref 0–200)
HDL: 90 mg/dL — AB (ref 35–70)
LDL CALC: 86 mg/dL
LDL/HDL RATIO: 1
TRIGLYCERIDES: 85 mg/dL (ref 40–160)

## 2015-06-09 LAB — BASIC METABOLIC PANEL
BUN: 34 mg/dL — AB (ref 4–21)
Creatinine: 1.1 mg/dL (ref 0.5–1.1)
Glucose: 78 mg/dL
Potassium: 3.7 mmol/L (ref 3.4–5.3)
Sodium: 140 mmol/L (ref 137–147)

## 2015-06-09 LAB — CBC AND DIFFERENTIAL
HCT: 40 % (ref 36–46)
Hemoglobin: 13.4 g/dL (ref 12.0–16.0)
PLATELETS: 378 10*3/uL (ref 150–399)
WBC: 8.1 10*3/mL

## 2015-06-09 LAB — HEPATIC FUNCTION PANEL
ALK PHOS: 54 U/L (ref 25–125)
ALT: 14 U/L (ref 7–35)
AST: 16 U/L (ref 13–35)
Bilirubin, Total: 0.8 mg/dL

## 2015-06-11 ENCOUNTER — Other Ambulatory Visit: Payer: Self-pay

## 2015-06-16 ENCOUNTER — Encounter: Payer: Self-pay | Admitting: Internal Medicine

## 2015-06-17 ENCOUNTER — Encounter: Payer: Self-pay | Admitting: Internal Medicine

## 2015-06-17 ENCOUNTER — Non-Acute Institutional Stay: Payer: Medicare Other | Admitting: Internal Medicine

## 2015-06-17 VITALS — BP 110/72 | HR 84 | Temp 97.7°F | Ht 62.0 in | Wt 156.0 lb

## 2015-06-17 DIAGNOSIS — J4520 Mild intermittent asthma, uncomplicated: Secondary | ICD-10-CM

## 2015-06-17 DIAGNOSIS — I10 Essential (primary) hypertension: Secondary | ICD-10-CM | POA: Diagnosis not present

## 2015-06-17 DIAGNOSIS — E785 Hyperlipidemia, unspecified: Secondary | ICD-10-CM | POA: Diagnosis not present

## 2015-06-17 DIAGNOSIS — L120 Bullous pemphigoid: Secondary | ICD-10-CM

## 2015-06-17 DIAGNOSIS — Z66 Do not resuscitate: Secondary | ICD-10-CM | POA: Diagnosis not present

## 2015-06-17 DIAGNOSIS — E039 Hypothyroidism, unspecified: Secondary | ICD-10-CM

## 2015-06-17 DIAGNOSIS — N3941 Urge incontinence: Secondary | ICD-10-CM

## 2015-06-17 NOTE — Progress Notes (Signed)
Passed clock drawing 

## 2015-06-17 NOTE — Progress Notes (Signed)
Patient ID: [name], female   DOB: June 30, 1924, 81 y.o.   MRN: [id number]   Location:  [facility]  Code Status: DNR  Goals of Care:Advanced Directive information Does patient have an advance directive?: Yes, Type of Advance Directive: Out of facility DNR (pink MOST or yellow form), Pre-existing out of facility DNR order (yellow form or pink MOST form): Yellow form placed in chart (order not valid for inpatient use);Pink MOST form placed in chart (order not valid for inpatient use)completed DNR form with her today and she is going to look at the MOST form so we can complete it next time -she does not want to be a vegetable -if she is at end of life, she is interested in receiving hospice care  Chief Complaint  Patient presents with  . Annual Exam    Comprehensive exam  . Medical Management of Chronic Issues    blood pressure, thyroid, cholesterol  . Bullous pemphigoid    Dx by Dr. Wilhemina Bonito May 2016    HPI: Patient is a 81 y.o. white female seen in the [facility] today for med mgt of he chronic diseases and her annual wellness exam.  She is doing well.    She notes that she was finally diagnosed with bullous pemphigoid by Dr. Wilhemina Bonito in May of this year after having her rash since August of 2015.  She has been on steroids since that time and has been unable to tolerate less than 7.5mg  daily without recurrence of itching.  She has gained 6 lbs and says she is constantly hungry.  She wants to discuss the alternative of methotrexate with Dr. Ubaldo Glassing when she seems him.    Aside from the rash, she is doing well.  She remains very active.  She is also upset about what the steroids are doing to her skin which has gotten very thin.  She notes her toes are purple, but she has no pain in them.  Her back in painful at times and she sees the chiropractor for adjustments.    She had injured her right leg on the posterior aspect last month and it is still healing (is closed).     She still has urge and stress incontinence and uses poise pads.  Tried depends, but didn't like them. Immunization History  Administered Date(s) Administered  . Influenza Split 07/25/2011, 07/24/2012  . Influenza Whole 07/24/2010, 08/16/2013  . Influenza-Unspecified 08/11/2014  . Pneumococcal Conjugate-13 11/18/2014  . Pneumococcal Polysaccharide-23 01/17/2004   Fall Risk  06/17/2015 05/07/2014 01/16/2013  Falls in the past year? No No No   Depression screen [facility] 2/9 06/17/2015 05/07/2014 01/16/2013  Decreased Interest 0 0 0  Down, Depressed, Hopeless 0 0 0  PHQ - 2 Score 0 0 0    MMSE - Mini Mental State Exam 06/17/2015  Orientation to time 5  Orientation to Place 5  Registration 3  Attention/ Calculation 5  Recall 3  Language- name 2 objects 2  Language- repeat 1  Language- follow 3 step command 3  Language- read & follow direction 1  Write a sentence 1  Copy design 1  Total score 30   She exercises daily.    She is independent in all ADLs. She walks without assistive device.    She does not see well at a distance and needs new glasses and an eye exam was recommended.    She cannot get zostavax now b/c she's immunosuppressed on prednisone.  She also has  some mild hearing loss that needs evaluation.  Review of Systems:  Review of Systems  Constitutional: Negative for fever, chills, weight loss and malaise/fatigue.       Wt gain  HENT: Positive for hearing loss. Negative for congestion.        Needs hearing checked  Eyes: Positive for blurred vision.       Needs eyes checked  Respiratory: Negative for cough and shortness of breath.   Cardiovascular: Negative for chest pain and leg swelling.  Gastrointestinal: Negative for heartburn, abdominal pain, diarrhea, constipation, blood in stool and melena.  Genitourinary: Positive for urgency and frequency. Negative for dysuria and hematuria.  Musculoskeletal: Positive for back pain. Negative for joint pain and falls.   Skin: Positive for itching and rash.  Neurological: Negative for dizziness, focal weakness, loss of consciousness and headaches.  Endo/Heme/Allergies: Bruises/bleeds easily.  Psychiatric/Behavioral: Negative for depression and memory loss.    Past Medical History  Diagnosis Date  . Nasal polyps   . Osteoporosis   . Mitral valve disorders   . Chronic rhinitis   . Unspecified urinary incontinence   . Asthma   . Hyperlipidemia   . Hypertension   . Urge incontinence   . Unspecified hypothyroidism   . Bilateral claudication of lower limb 08/29/2013    Bilateral posterior thighs  . Aortic atherosclerosis 08/29/2013    Identified on Aortic ultrasound exam 2008   . Bullous pemphigoid 02/2015    Dr. Wilhemina Bonito    Past Surgical History  Procedure Laterality Date  . Nasal polyp surgery  03/2002    x2 Dr Wilburn Cornelia  . Tonsillectomy    . Total abdominal hysterectomy w/ bilateral salpingoophorectomy  1970's  . Bunionectomy Left 12/1998  . Milk cyst right breast    . Breast lumpectomy Right 1953  . Bunionectomy Right 02/2000  . Eye surgery Bilateral 02/2000    Cataract removal  . Root canal  02/2014    Social History:   reports that she has never smoked. She has never used smokeless tobacco. She reports that she drinks about 1.2 oz of alcohol per week. She reports that she does not use illicit drugs.  Allergies  Allergen Reactions  . Sulfa Antibiotics   . Chlorine   . Codeine   . Macrobid [Nitrofurantoin Monohydrate Macrocrystals]   . Statins   . Trimethoprim     Medications: Patient's Medications  New Prescriptions   No medications on file  Previous Medications   CHOLECALCIFEROL (VITAMIN D) 1000 UNITS TABLET    Take 2,000 Units by mouth daily.   DOXYCYCLINE (VIBRAMYCIN) 100 MG CAPSULE    Take one twice daily   LEVOTHYROXINE (SYNTHROID, LEVOTHROID) 75 MCG TABLET    TAKE 1 TABLET BY MOUTH EVERY MORNING   NIACINAMIDE 500 MG TABLET    Take one three times daily with meals    PREDNISONE (DELTASONE) 5 MG TABLET    Take 1 1/2 tablet daily   PROBIOTIC PRODUCT (PROBIOTIC DAILY PO)    Take by mouth. Take one daily to protect colon while taking antibiotic  Modified Medications   No medications on file  Discontinued Medications   ALBUTEROL (PROAIR HFA) 108 (90 BASE) MCG/ACT INHALER    Inhale 2 puffs into the lungs 4 (four) times daily as needed.   BUDESONIDE-FORMOTEROL (SYMBICORT) 160-4.5 MCG/ACT INHALER    USE 2 PUFFS ONCE DAILY, RINSE WELL AFTER EACH USE   IBUPROFEN (ADVIL,MOTRIN) 200 MG TABLET    Take 200 mg by mouth. Take two  tablet daily as needed for pain   MYRBETRIQ 50 MG TB24 TABLET    Take one tablet in evening for bladder   ZOSTER VACCINE LIVE, PF, (ZOSTAVAX) 59563 UNT/0.65ML INJECTION    Inject 19,400 Units into the skin once.     Physical Exam: Filed Vitals:   06/17/15 1525  BP: 110/72  Pulse: 84  Temp: 97.7 F (36.5 C)  TempSrc: Oral  Height: 5\' 2"  (1.575 m)  Weight: 156 lb (70.761 kg)  SpO2: 97%   Body mass index is 28.53 kg/(m^2). Physical Exam  Constitutional: She is oriented to person, place, and time. She appears well-developed and well-nourished. No distress.  HENT:  Head: Normocephalic and atraumatic.  Right Ear: External ear normal.  Left Ear: External ear normal.  Nose: Nose normal.  Mouth/Throat: Oropharynx is clear and moist. No oropharyngeal exudate.  Right ear mild cerumen  Eyes: Conjunctivae and EOM are normal. Pupils are equal, round, and reactive to light.  Neck: Normal range of motion. Neck supple. No JVD present. No tracheal deviation present. No thyromegaly present.  Cardiovascular: Normal rate, regular rhythm, normal heart sounds and intact distal pulses.   Pulmonary/Chest: Effort normal and breath sounds normal. No respiratory distress. Right breast exhibits no inverted nipple, no mass, no nipple discharge, no skin change and no tenderness. Left breast exhibits no inverted nipple, no mass, no nipple discharge, no skin  change and no tenderness.  Abdominal: Soft. Bowel sounds are normal. She exhibits no distension. There is no tenderness.  Musculoskeletal: Normal range of motion. She exhibits no edema or tenderness.  Neurological: She is alert and oriented to person, place, and time. She displays normal reflexes. No cranial nerve deficit. She exhibits normal muscle tone. Coordination normal.  Skin: Skin is warm and dry.  Multiple stuck on papules of chest, back, abdomen, breasts; one erythematous area at top of pantyline on abdomen and an area on her left ear where a bulla must have ruptured recently  Psychiatric: She has a normal mood and affect. Her behavior is normal. Judgment and thought content normal.     Labs reviewed: Basic Metabolic Panel:  Recent Labs  11/04/14 06/09/15  NA 142 140  K 4.6 3.7  BUN 22* 34*  CREATININE 0.9 1.1   Liver Function Tests:  Recent Labs  06/09/15  AST 16  ALT 14  ALKPHOS 54   No results for input(s): LIPASE, AMYLASE in the last 8760 hours. No results for input(s): AMMONIA in the last 8760 hours. CBC:  Recent Labs  06/09/15  WBC 8.1  HGB 13.4  HCT 40  PLT 378   Lipid Panel:  Recent Labs  06/09/15  CHOL 188  HDL 90*  LDLCALC 86  TRIG 85   No results found for: HGBA1C  Patient Care Team: Gayland Curry, DO as PCP - General (Geriatric Medicine) [facility] Deneise Lever, MD as Consulting Physician (Pulmonary Disease)  Assessment/Plan 1. Bullous pemphigoid -cont prednisone as per dermatology  2. Allergic asthma, mild intermittent, uncomplicated -stable, has not required albuterol inhaler or symbicort, she is on steroids at present for #1  3. Urge incontinence -continues with use of incontinence pads as medications were not effective  4. Essential hypertension -bp well controlled with diet and exercise only  5. Hypothyroidism, unspecified hypothyroidism type -well controlled with current synthroid 52mcg daily so  continue  6. Hyperlipidemia - on niacin, does diet and exercise, reassess in 1 year  7. Advance directive indicates patient wish for do-not-resuscitate  status - DNR (Do Not Resuscitate)  Next appt:  6 mos for med mgt  Shloma Roggenkamp L. Inayah Woodin, D.O. [facility] [address], [place] [address] Cell Phone (Mon-Fri 8am-5pm):  [phone] On Call:  [phone] & follow prompts after 5pm & weekends Office Phone:  [phone] Office Fax:  [phone]

## 2015-07-29 ENCOUNTER — Encounter: Payer: Self-pay | Admitting: Internal Medicine

## 2015-07-29 ENCOUNTER — Non-Acute Institutional Stay: Payer: Medicare Other | Admitting: Internal Medicine

## 2015-07-29 VITALS — BP 120/74 | HR 84 | Temp 97.9°F | Wt 157.0 lb

## 2015-07-29 DIAGNOSIS — L120 Bullous pemphigoid: Secondary | ICD-10-CM | POA: Insufficient documentation

## 2015-07-29 DIAGNOSIS — B001 Herpesviral vesicular dermatitis: Secondary | ICD-10-CM

## 2015-07-29 DIAGNOSIS — H6121 Impacted cerumen, right ear: Secondary | ICD-10-CM | POA: Diagnosis not present

## 2015-07-29 MED ORDER — MAGIC MOUTHWASH W/LIDOCAINE: 5.0000 mL | Freq: Four times a day (QID) | ORAL | Status: AC

## 2015-07-29 NOTE — Progress Notes (Signed)
Location: [facility]  Code Status: DNR Goals of Care: Advanced Directive information Does patient have an advance directive?: Yes, Type of Advance Directive: Living will;Out of facility DNR (pink MOST or yellow form), Pre-existing out of facility DNR order (yellow form or pink MOST form): Yellow form placed in chart (order not valid for inpatient use)   Chief Complaint  Patient presents with  . Ear Pain    right for few days, cold sore, throat sensitive    HPI: Patient is a 81 y.o. female seen in the office today for R ear pain. She is in the midst of treatment for bullous pemphigoid with prednisone and she is having some side effects such as weight gain and dizziness. She has started methotrexate and is tapering of prednisone.  She has had ear pain for about 5 days. Feels like it is inside, has been going and coming, not interfering with sleep. Pain also radiates down into right side of throat. She has not taken any pain reliever for it. Also she feels like she has a sore on the right side of her tongue, but she hasn't seen a sore. Not interfering with eating, but she definitely feels it.  Review of Systems:  Review of Systems  Constitutional: Negative for fever and chills.  HENT: Positive for ear pain, hearing loss and sore throat. Negative for ear discharge.           Respiratory: Negative for cough and shortness of breath.   Cardiovascular: Negative for chest pain, palpitations and orthopnea.  Gastrointestinal: Negative for nausea and vomiting.  Neurological: Positive for dizziness.  Endo/Heme/Allergies: Bruises/bleeds easily.    Past Medical History  Diagnosis Date  . Nasal polyps   . Osteoporosis   . Mitral valve disorders   . Chronic rhinitis   . Unspecified urinary incontinence   . Asthma   . Hyperlipidemia   . Hypertension   . Urge incontinence   . Unspecified hypothyroidism   . Bilateral claudication of lower limb ([place]) 08/29/2013    Bilateral posterior  thighs  . Aortic atherosclerosis ([place]) 08/29/2013    Identified on Aortic ultrasound exam 2008   . Bullous pemphigoid 02/2015    Dr. Wilhemina Bonito    Past Surgical History  Procedure Laterality Date  . Nasal polyp surgery  03/2002    x2 Dr Wilburn Cornelia  . Tonsillectomy    . Total abdominal hysterectomy w/ bilateral salpingoophorectomy  1970's  . Bunionectomy Left 12/1998  . Milk cyst right breast    . Breast lumpectomy Right 1953  . Bunionectomy Right 02/2000  . Eye surgery Bilateral 02/2000    Cataract removal  . Root canal  02/2014    Allergies  Allergen Reactions  . Sulfa Antibiotics   . Chlorine   . Codeine   . Macrobid [Nitrofurantoin Monohydrate Macrocrystals]   . Statins   . Trimethoprim    Medications: Patient's Medications  New Prescriptions   No medications on file  Previous Medications   CHOLECALCIFEROL (VITAMIN D) 1000 UNITS TABLET    Take 2,000 Units by mouth daily.   FOLIC ACID (FOLVITE) 1 MG TABLET    1 mg. Take one tablet daily   LEVOTHYROXINE (SYNTHROID, LEVOTHROID) 75 MCG TABLET    TAKE 1 TABLET BY MOUTH EVERY MORNING   METHOTREXATE (RHEUMATREX) 2.5 MG TABLET    2.5 mg. Take 3 tablets in morning and 3 tablets at bedtime on Friday   PREDNISONE (DELTASONE) 5 MG TABLET    5 mg. Take  1 tablet daily  Modified Medications   No medications on file  Discontinued Medications   DOXYCYCLINE (VIBRAMYCIN) 100 MG CAPSULE    Take one twice daily   NIACINAMIDE 500 MG TABLET    Take one three times daily with meals   PROBIOTIC PRODUCT (PROBIOTIC DAILY PO)    Take by mouth. Take one daily to protect colon while taking antibiotic    Physical Exam: Filed Vitals:   07/29/15 0937  BP: 120/74  Pulse: 84  Temp: 97.9 F (36.6 C)  TempSrc: Oral  Weight: 157 lb (71.215 kg)  SpO2: 91%   Physical Exam  Constitutional: She is oriented to person, place, and time. She appears well-developed and well-nourished. No distress.  HENT:  R ear with cerumen, unable to visualize TM L ear-  mild cerumen, BLM's visualized, TM intact A few red lesions on the back of the tongue on the right side Healing small pink lesion in the corner of L mouth  Cardiovascular: Normal rate and regular rhythm.   Pulmonary/Chest: Effort normal and breath sounds normal. She has no wheezes. She has no rales.  Musculoskeletal: Normal range of motion.  Neurological: She is alert and oriented to person, place, and time.  Skin: Skin is warm and dry.  Bruises, bleed easily, skin thin from prednisone    Labs reviewed: Basic Metabolic Panel:  Recent Labs  11/04/14 06/09/15  NA 142 140  K 4.6 3.7  BUN 22* 34*  CREATININE 0.9 1.1   Liver Function Tests:  Recent Labs  06/09/15  AST 16  ALT 14  ALKPHOS 54     CBC:  Recent Labs  06/09/15  WBC 8.1  HGB 13.4  HCT 40  PLT 378   Lipid Panel:  Recent Labs  06/09/15  CHOL 188  HDL 90*  LDLCALC 86  TRIG 85      Assessment/Plan  1. Bullous pemphigoid -Ongoing for over a year now. Being managed with prednisone and now methotrexate. Appears there may be some mucosal involvement now. Will manage pain with magic mouthwash. Continue prednisone and methotrexate as prescribed by Dr. Ubaldo Glassing. Follow-up with Dr. Ubaldo Glassing. Patient states she will call Dr. Ubaldo Glassing and inform of new development.   2. Cerumen debris on tympanic membrane, right -Likely causing some decreased hearing. Physical removal by Dr. Mariea Clonts was unsuccessful. Recommend Debrox. Instructions written and given to patient by Dr. Mariea Clonts.   3. Cold sore -Recurrent every spring and fall per patient. This recurrence may be related to immunocompromised state r/t prednisone and methotrexate. Since this lesion is in a healing state, treatment with antiviral is not indicated.    Next appt: as scheduled in February with Dr. Mariea Clonts for medical management and MOST form.  Mariana Kaufman, RN, BSN Student- Nurse Practitioner- [facility] [address], [place] [address] Office Phone:  [phone] Office Fax:  [phone]

## 2015-08-07 ENCOUNTER — Other Ambulatory Visit: Payer: Self-pay | Admitting: Internal Medicine

## 2015-10-07 ENCOUNTER — Other Ambulatory Visit: Payer: Self-pay | Admitting: Otolaryngology

## 2015-10-07 DIAGNOSIS — K148 Other diseases of tongue: Secondary | ICD-10-CM

## 2015-10-07 DIAGNOSIS — R220 Localized swelling, mass and lump, head: Principal | ICD-10-CM

## 2015-10-12 ENCOUNTER — Ambulatory Visit
Admission: RE | Admit: 2015-10-12 | Discharge: 2015-10-12 | Disposition: A | Discharge status: Home / Self Care | Payer: Medicare Other | Source: Ambulatory Visit | Attending: Otolaryngology | Admitting: Otolaryngology

## 2015-10-12 DIAGNOSIS — R220 Localized swelling, mass and lump, head: Principal | ICD-10-CM

## 2015-10-12 DIAGNOSIS — K148 Other diseases of tongue: Secondary | ICD-10-CM

## 2015-10-12 MED ORDER — IOPAMIDOL (ISOVUE-300) INJECTION 61%: 75.0000 mL | Freq: Once | INTRAVENOUS | Status: DC | PRN

## 2015-10-15 ENCOUNTER — Other Ambulatory Visit: Payer: PRIVATE HEALTH INSURANCE

## 2015-10-22 ENCOUNTER — Other Ambulatory Visit: Payer: Self-pay | Admitting: Otolaryngology

## 2015-10-22 NOTE — Pre-Procedure Instructions (Addendum)
[name]  10/22/2015      [id number] [place], [place] - [address] [place] [address] Phone: [phone] Fax: [phone] - [place], [place] - [address] AT [place] [address] [facility] [address] Phone: [phone] Fax: [phone]    Your procedure is scheduled on Friday, October 30, 2015  Report to [facility] Admitting at 9:30 A.M.  Call this number if you have problems the morning of surgery:  [phone]   Remember:  Do not eat food or drink liquids after midnight Thursday, October 29, 2015  Take these medicines the morning of surgery with A SIP OF WATER: levothyroxine (SYNTHROID, LEVOTHROID), if needed:acetaminophen (TYLENOL) for pain  Stop taking Aspirin, vitamins, fish oil, and herbal medications. Do not take any NSAIDs ie: Ibuprofen, Advil, Naproxen or any medication containing Aspirin; stop Saturday, October 24, 2015   Do not wear jewelry, make-up or nail polish.  Do not wear lotions, powders, or perfumes.  You may not wear deodorant.  Do not shave 48 hours prior to surgery.   Do not bring valuables to the hospital.  [facility] is not responsible for any belongings or valuables.  Contacts, dentures or bridgework may not be worn into surgery.  Leave your suitcase in the car.  After surgery it may be brought to your room.  For patients admitted to the hospital, discharge time will be determined by your treatment team.  Patients discharged the day of surgery will not be allowed to drive home.   Name and phone number of your driver:  Special instructions: [place] - Preparing for Surgery  Before surgery, you can play an important role.  Because skin is not sterile, your skin needs to be as free of germs as possible.  You can reduce the number of germs on you skin by washing with CHG (chlorahexidine gluconate) soap before surgery.  CHG  is an antiseptic cleaner which kills germs and bonds with the skin to continue killing germs even after washing.  Please DO NOT use if you have an allergy to CHG or antibacterial soaps.  If your skin becomes reddened/irritated stop using the CHG and inform your nurse when you arrive at Short Stay.  Do not shave (including legs and underarms) for at least 48 hours prior to the first CHG shower.  You may shave your face.  Please follow these instructions carefully:   1.  Shower with CHG Soap the night before surgery and the morning of Surgery.  2.  If you choose to wash your hair, wash your hair first as usual with your normal shampoo.  3.  After you shampoo, rinse your hair and body thoroughly to remove the Shampoo.  4.  Use CHG as you would any other liquid soap.  You can apply chg directly  to the skin and wash gently with scrungie or a clean washcloth.  5.  Apply the CHG Soap to your body ONLY FROM THE NECK DOWN.  Do not use on open wounds or open sores.  Avoid contact with your eyes, ears, mouth and genitals (private parts).  Wash genitals (private parts) with your normal soap.  6.  Wash thoroughly, paying special attention to the area where your surgery will be performed.  7.  Thoroughly rinse your body with warm water from the neck down.  8.  DO NOT shower/wash with your normal soap after using and  rinsing off the CHG Soap.  9.  Pat yourself dry with a clean towel.            10.  Wear clean pajamas.            11.  Place clean sheets on your bed the night of your first shower and do not sleep with pets.  Day of Surgery  Do not apply any lotions/deodorants the morning of surgery.  Please wear clean clothes to the hospital/surgery center.  Please read over the following fact sheets that you were given. Pain Booklet, Coughing and Deep Breathing and Surgical Site Infection Prevention

## 2015-10-23 ENCOUNTER — Encounter (HOSPITAL_COMMUNITY): Payer: Self-pay

## 2015-10-23 ENCOUNTER — Encounter (HOSPITAL_COMMUNITY)
Admission: RE | Admit: 2015-10-23 | Discharge: 2015-10-23 | Disposition: A | Discharge status: Home / Self Care | Payer: Medicare Other | Source: Ambulatory Visit | Attending: Otolaryngology | Admitting: Otolaryngology

## 2015-10-23 DIAGNOSIS — R220 Localized swelling, mass and lump, head: Secondary | ICD-10-CM | POA: Diagnosis not present

## 2015-10-23 DIAGNOSIS — I10 Essential (primary) hypertension: Secondary | ICD-10-CM | POA: Insufficient documentation

## 2015-10-23 DIAGNOSIS — Z01812 Encounter for preprocedural laboratory examination: Secondary | ICD-10-CM | POA: Diagnosis not present

## 2015-10-23 DIAGNOSIS — Z01818 Encounter for other preprocedural examination: Secondary | ICD-10-CM | POA: Diagnosis present

## 2015-10-23 HISTORY — DX: Unspecified osteoarthritis, unspecified site: M19.90

## 2015-10-23 LAB — CBC
HCT: 40.7 % (ref 36.0–46.0)
Hemoglobin: 12.8 g/dL (ref 12.0–15.0)
MCH: 31.3 pg (ref 26.0–34.0)
MCHC: 31.4 g/dL (ref 30.0–36.0)
MCV: 99.5 fL (ref 78.0–100.0)
PLATELETS: 390 10*3/uL (ref 150–400)
RBC: 4.09 MIL/uL (ref 3.87–5.11)
RDW: 14.6 % (ref 11.5–15.5)
WBC: 9.9 10*3/uL (ref 4.0–10.5)

## 2015-10-23 LAB — BASIC METABOLIC PANEL
Anion gap: 9 (ref 5–15)
BUN: 17 mg/dL (ref 6–20)
CALCIUM: 9.5 mg/dL (ref 8.9–10.3)
CO2: 27 mmol/L (ref 22–32)
CREATININE: 0.93 mg/dL (ref 0.44–1.00)
Chloride: 105 mmol/L (ref 101–111)
GFR calc non Af Amer: 53 mL/min — ABNORMAL LOW (ref >60)
GLUCOSE: 94 mg/dL (ref 65–99)
Potassium: 4.3 mmol/L (ref 3.5–5.1)
Sodium: 141 mmol/L (ref 135–145)

## 2015-10-26 ENCOUNTER — Non-Acute Institutional Stay (SKILLED_NURSING_FACILITY): Payer: Medicare Other | Admitting: Internal Medicine

## 2015-10-26 ENCOUNTER — Encounter: Payer: Self-pay | Admitting: Internal Medicine

## 2015-10-26 DIAGNOSIS — L120 Bullous pemphigoid: Secondary | ICD-10-CM

## 2015-10-26 DIAGNOSIS — R220 Localized swelling, mass and lump, head: Secondary | ICD-10-CM

## 2015-10-26 DIAGNOSIS — K148 Other diseases of tongue: Secondary | ICD-10-CM

## 2015-10-26 DIAGNOSIS — N3941 Urge incontinence: Secondary | ICD-10-CM | POA: Diagnosis not present

## 2015-10-26 DIAGNOSIS — M47817 Spondylosis without myelopathy or radiculopathy, lumbosacral region: Secondary | ICD-10-CM

## 2015-10-26 DIAGNOSIS — E039 Hypothyroidism, unspecified: Secondary | ICD-10-CM

## 2015-10-26 DIAGNOSIS — M5136 Other intervertebral disc degeneration, lumbar region: Secondary | ICD-10-CM

## 2015-10-26 NOTE — Progress Notes (Signed)
Patient ID: [name], female   DOB: September 26, 1924, 82 y.o.   MRN: [id number]  Provider:  Rexene Edison. Mariea Clonts, D.O., C.M.D. Location:  [facility] PCP: Violet Cart, DO  Code Status: full code while here Goals of Care: Advanced Directive information Does patient have an advance directive?: Yes, Type of Advance Directive: [place];Living will   Chief Complaint  Patient presents with  . New Admit To SNF    12/31 with increased low back pain with radiation to left leg--here for PT; also has biopsy of mass on right side of tongue friday--needing tylenol for this  . Discharge Note    d/c home today with use of cane for balance    HPI: Patient is a 82 y.o. female seen today for admission to rehab at [facility] for back pain 12/31.  She reports she has a h/o anterolisthesis of L5 that flares up some and she's also had sciatica on that side in the past, but it always improved with chiropractor treatment.  She saw Dr. Delories Heinz last Thursday and two previous times recently to work on her back, but did not experience improvement of the soreness of her left lower back and hip/thigh region.  It does not go all the way down the leg.  She had an xray done the day she came and it showed arthritis/DDD and facet arthropathy of her lumbosacral spine, osteophytes, but no acute changes.  She has been resting and had initially been unable to walk on her left foot.  She started using a cane the day before she came here for support.  She now is able to walk with the cane steadily and with minimal pain and requests to go home.  She will be having her right posterior tongue double biopsy on Friday by Dr. Wilburn Cornelia.  Her daughter will be coming to stay with her.  She is worried about this and has a few things she wants to get done before that.  Her intake has decreased due to the tongue pain and she has lost about 10lbs since October.    Review of Systems  Constitutional: Positive for weight loss.  Negative for fever, chills and malaise/fatigue.  HENT: Negative for congestion.        Tongue pain and mass present on right posterior tongue  Eyes: Negative for blurred vision.  Respiratory: Negative for shortness of breath.   Cardiovascular: Negative for chest pain.  Gastrointestinal: Negative for abdominal pain, diarrhea, constipation, blood in stool and melena.       Stools have been hard pebbles with decreased intake and activity  Genitourinary: Positive for urgency and frequency. Negative for dysuria.       Frequency improved some with myrbetriq  Musculoskeletal: Positive for back pain. Negative for myalgias and falls.  Skin: Negative for itching and rash.       Bullous pemphigoid improved  Neurological: Negative for dizziness, loss of consciousness, weakness and headaches.  Psychiatric/Behavioral: Negative for depression and memory loss.       Anxiety about upcoming biopsy on fri    Past Medical History  Diagnosis Date  . Nasal polyps   . Osteoporosis   . Mitral valve disorders   . Chronic rhinitis   . Unspecified urinary incontinence   . Asthma   . Hyperlipidemia   . Hypertension   . Urge incontinence   . Unspecified hypothyroidism   . Bilateral claudication of lower limb ([place]) 08/29/2013    Bilateral posterior thighs  . Aortic atherosclerosis (  [place]) 08/29/2013    Identified on Aortic ultrasound exam 2008   . Bullous pemphigoid 02/2015    Dr. Wilhemina Bonito  . Arthritis    Past Surgical History  Procedure Laterality Date  . Nasal polyp surgery  03/2002    x2 Dr Wilburn Cornelia  . Tonsillectomy    . Total abdominal hysterectomy w/ bilateral salpingoophorectomy  1970's  . Bunionectomy Left 12/1998  . Milk cyst right breast    . Breast lumpectomy Right 1953  . Bunionectomy Right 02/2000  . Eye surgery Bilateral 02/2000    Cataract removal  . Root canal  02/2014    reports that she has never smoked. She has never used smokeless tobacco. She reports that she drinks about 1.2 oz of  alcohol per week. She reports that she does not use illicit drugs. Family History  Problem Relation Age of Onset  . Diabetes Mother     Adult onset  . Arthritis Mother   . Breast cancer Mother 58  . Heart attack Father   . Diabetes Father     Adult onset  . Heart disease Father   . Heart Problems Father     Arrhythmia requiring pacemaker  . Diabetes type II      sibling  . Allergies      1/2 sister(father's side)  . Diabetes Brother   . Fibromyalgia Daughter     Allergies  Allergen Reactions  . Sulfa Antibiotics   . Ampicillin Swelling  . Chlorine   . Codeine   . Macrobid [Nitrofurantoin Monohydrate Macrocrystals]   . Statins   . Trimethoprim       Medication List       This list is accurate as of: 10/26/15  1:40 PM.  Always use your most recent med list.               acetaminophen 500 MG tablet  Commonly known as:  TYLENOL  Take 500 mg by mouth every 6 (six) hours as needed for mild pain, moderate pain or headache.     cholecalciferol 1000 units tablet  Commonly known as:  VITAMIN D  Take 2,000 Units by mouth daily.     levothyroxine 75 MCG tablet  Commonly known as:  SYNTHROID, LEVOTHROID  TAKE 1 TABLET BY MOUTH EVERY MORNING     methotrexate 2.5 MG tablet  Commonly known as:  RHEUMATREX  Take 15 mg by mouth every Friday. Take 3 tablets in morning and 3 tablets at bedtime on Friday     MYRBETRIQ 50 MG Tb24 tablet  Generic drug:  mirabegron ER  Take 50 mg by mouth daily as needed (urinary incontinence).     predniSONE 5 MG tablet  Commonly known as:  DELTASONE  Take 2.5 mg by mouth daily with breakfast.        Physical Exam: Filed Vitals:   10/26/15 1323  BP: 149/66  Pulse: 82  Temp: 96.6 F (35.9 C)  Resp: 20  Height: 5\' 2"  (1.575 m)  Weight: 147 lb (66.679 kg)  SpO2: 96%   Body mass index is 26.88 kg/(m^2). Physical Exam  Constitutional: She is oriented to person, place, and time. She appears well-developed and well-nourished. No  distress.  HENT:  Head: Normocephalic and atraumatic.  Right Ear: External ear normal.  Left Ear: External ear normal.  Nose: Nose normal.  Irregular, lumpy mass on right posterior tongue, erythematous  Eyes: Conjunctivae and EOM are normal. Pupils are equal, round, and reactive to light.  Neck:  Normal range of motion. Neck supple.  Cardiovascular: Normal rate, regular rhythm, normal heart sounds and intact distal pulses.   Pulmonary/Chest: Effort normal and breath sounds normal. No respiratory distress.  Abdominal: Bowel sounds are normal.  Musculoskeletal: Normal range of motion. She exhibits tenderness. She exhibits no edema.  Mild tenderness of left lower lumbar paravertebral area and sacroiliac region and left lateral thigh  Lymphadenopathy:       Head (right side): Posterior auricular adenopathy present.    She has cervical adenopathy.       Right cervical: Superficial cervical, deep cervical and posterior cervical adenopathy present.  Hard matted lymphadenopathy present--pt has noted it herself also  Neurological: She is alert and oriented to person, place, and time. She has normal reflexes. No cranial nerve deficit. She exhibits normal muscle tone.  Skin: Skin is warm and dry.  No obvious bullous pemphigoid lesions at present; erythema and shiny skin on bilateral lower legs  Psychiatric: She has a normal mood and affect. Her behavior is normal. Judgment and thought content normal.    Labs reviewed: Basic Metabolic Panel:  Recent Labs  11/04/14 06/09/15 10/23/15 0940  NA 142 140 141  K 4.6 3.7 4.3  CL  --   --  105  CO2  --   --  27  GLUCOSE  --   --  94  BUN 22* 34* 17  CREATININE 0.9 1.1 0.93  CALCIUM  --   --  9.5   Liver Function Tests:  Recent Labs  06/09/15  AST 16  ALT 14  ALKPHOS 54   No results for input(s): LIPASE, AMYLASE in the last 8760 hours. No results for input(s): AMMONIA in the last 8760 hours. CBC:  Recent Labs  06/09/15 10/23/15 0940    WBC 8.1 9.9  HGB 13.4 12.8  HCT 40 40.7  MCV  --  99.5  PLT 378 390   Cardiac Enzymes: No results for input(s): CKTOTAL, CKMB, CKMBINDEX, TROPONINI in the last 8760 hours. BNP: Invalid input(s): POCBNP No results found for: HGBA1C Lab Results  Component Value Date   TSH 2.22 02/20/2014   No results found for: VITAMINB12 No results found for: FOLATE No results found for: IRON, TIBC, FERRITIN  Imaging and Procedures obtained prior to SNF admission: CT soft tissue neck with contrast 10/12/15:  Pharynx and larynx: Enhancing mass in the base the tongue measures approximately 14 mm. This is in the midline extending slightly to the left. Additional enhancing tissue in the left base of tongue could represent tumor or tonsillar tissue. No laryngeal mass allowing for motion. Airway intact. Salivary glands: Parotid gland and submandibular gland normal bilaterally. No mass or edema or calcification Thyroid: Thyroid atrophy without mass Lymph nodes: Malignant appearing adenopathy in the right neck. 16 mm posterior Level 5 lymph node on the right. 13 mm posterior lymph node on the right with mild necrosis. 10 mm posterior lymph node on the right. 8 mm posterior lymph node on the right. Supraclavicular level 4 lymph nodes on the right measuring 10 and 11 mm also likely malignant. No malignant adenopathy in the left neck. Vascular: Jugular vein and carotid artery patent bilaterally. Limited intracranial: Negative Visualized orbits: Negative Mastoids and visualized paranasal sinuses: Right sphenoid sinus is fully opacified with diffuse bony thickening compatible with chronic sinusitis. There is bony thickening of the maxillary sinus bilaterally due to chronic sinusitis. No air-fluid level. Skeleton: Moderate cervical disc and facet degeneration. No fracture or bony lesion. Anterior slip C2-3  appears degenerative. Upper chest: Lung apices are clear. 10 mm precarinal lymph node has a fatty  hilum and likely is benign.  Lumbar spine portable xray 10/25/15:  Moderate degenerative changes of lumbar spine, osteophytes, decreased disc spaces L1-2, L5-S1, facetal arthropathy L3-4 to L5-S1  Assessment/Plan 1. DDD (degenerative disc disease), lumbar -she is doing better with her back pain today after a couple of days of rest and tylenol in rehab -she is now requesting d/c home -she was able to ambulate with minimal support of a cane and very little pain into her left leg  2. Facet arthropathy, lumbosacral -suspect her pain was radicular from some stenosis vs. Osteophyte nerve compression  -she reports also she's had anterior spondylolisthesis of L5 in the past, but this is not reported on her xrays -cont tylenol use -she says heat also feels good   3. Bullous pemphigoid -is on prednisone taper with long term plan for methotrexate only -seems her skin has cleared up for the time being -follows with Dr. Ubaldo Glassing  4. Mass of tongue -right posterior tongue -this appears significantly more obvious and larger, irregular, lumpier than when I looked at it in clinic in October when it looked like an ulceration and was difficult to visualize -also she now has hard, matted lymph nodes in the posterior cervical chain/posterior auricular region -review of the CT Dr. Wilburn Cornelia ordered shows malignant-appearing lymphadenopathy in that same area which is quite concerning--pt seems to have a sense that something is wrong -she is losing weight--eating less due to prolonged tenderness of her tongue  5. Hypothyroidism, unspecified hypothyroidism type -cont current synthroid--needs a TSH ordered at her next appt--no thyroid abnormalities noted on her CT neck  6. Urge incontinence -cont myrbetriq for this with some benefit noted  Functional status:  Independent in adls, walking with walker  Family/ staff Communication: discussed with nursing staff--pt to discharge home this afternoon.  Labs/tests  ordered:  No new, just had preop testing for tongue biopsy  Kala Gassmann L. Lynk Marti, D.O. [facility] [address], [place] [address] Cell Phone (Mon-Fri 8am-5pm):  [phone] On Call:  [phone] & follow prompts after 5pm & weekends Office Phone:  [phone] Office Fax:  [phone]

## 2015-10-29 MED ORDER — DEXAMETHASONE SODIUM PHOSPHATE 10 MG/ML IJ SOLN
10.0000 mg | INTRAMUSCULAR | Status: AC
  Administered 2015-10-30: 10 mg via INTRAVENOUS
  Filled 2015-10-29: qty 1

## 2015-10-30 ENCOUNTER — Encounter (HOSPITAL_COMMUNITY)
Admission: RE | Disposition: A | Discharge status: Home / Self Care | Payer: Self-pay | Source: Ambulatory Visit | Attending: Otolaryngology

## 2015-10-30 ENCOUNTER — Ambulatory Visit (HOSPITAL_COMMUNITY): Payer: Medicare Other | Admitting: Certified Registered"

## 2015-10-30 ENCOUNTER — Ambulatory Visit (HOSPITAL_COMMUNITY)
Admission: RE | Admit: 2015-10-30 | Discharge: 2015-10-30 | Disposition: A | Discharge status: Home / Self Care | Payer: Medicare Other | Source: Ambulatory Visit | Attending: Otolaryngology | Admitting: Otolaryngology

## 2015-10-30 ENCOUNTER — Encounter (HOSPITAL_COMMUNITY): Payer: Self-pay | Admitting: *Deleted

## 2015-10-30 DIAGNOSIS — I10 Essential (primary) hypertension: Secondary | ICD-10-CM | POA: Insufficient documentation

## 2015-10-30 DIAGNOSIS — M810 Age-related osteoporosis without current pathological fracture: Secondary | ICD-10-CM | POA: Insufficient documentation

## 2015-10-30 DIAGNOSIS — Z90710 Acquired absence of both cervix and uterus: Secondary | ICD-10-CM | POA: Insufficient documentation

## 2015-10-30 DIAGNOSIS — C01 Malignant neoplasm of base of tongue: Secondary | ICD-10-CM | POA: Insufficient documentation

## 2015-10-30 DIAGNOSIS — J45909 Unspecified asthma, uncomplicated: Secondary | ICD-10-CM | POA: Diagnosis not present

## 2015-10-30 DIAGNOSIS — E785 Hyperlipidemia, unspecified: Secondary | ICD-10-CM | POA: Insufficient documentation

## 2015-10-30 DIAGNOSIS — I340 Nonrheumatic mitral (valve) insufficiency: Secondary | ICD-10-CM | POA: Diagnosis not present

## 2015-10-30 DIAGNOSIS — I700 Atherosclerosis of aorta: Secondary | ICD-10-CM | POA: Insufficient documentation

## 2015-10-30 DIAGNOSIS — Z8249 Family history of ischemic heart disease and other diseases of the circulatory system: Secondary | ICD-10-CM | POA: Diagnosis not present

## 2015-10-30 DIAGNOSIS — M1990 Unspecified osteoarthritis, unspecified site: Secondary | ICD-10-CM | POA: Insufficient documentation

## 2015-10-30 DIAGNOSIS — E039 Hypothyroidism, unspecified: Secondary | ICD-10-CM | POA: Insufficient documentation

## 2015-10-30 DIAGNOSIS — Z79899 Other long term (current) drug therapy: Secondary | ICD-10-CM | POA: Diagnosis not present

## 2015-10-30 DIAGNOSIS — D3702 Neoplasm of uncertain behavior of tongue: Secondary | ICD-10-CM | POA: Diagnosis present

## 2015-10-30 HISTORY — PX: DIRECT LARYNGOSCOPY: SHX5326

## 2015-10-30 SURGERY — LARYNGOSCOPY, DIRECT
Anesthesia: General | Site: Mouth

## 2015-10-30 MED ORDER — ALBUTEROL SULFATE HFA 108 (90 BASE) MCG/ACT IN AERS
INHALATION_SPRAY | RESPIRATORY_TRACT | Status: AC
  Filled 2015-10-30: qty 6.7

## 2015-10-30 MED ORDER — LIDOCAINE HCL (CARDIAC) 20 MG/ML IV SOLN
INTRAVENOUS | Status: DC | PRN
  Administered 2015-10-30: 20 mg via INTRAVENOUS

## 2015-10-30 MED ORDER — PROPOFOL 10 MG/ML IV BOLUS
INTRAVENOUS | Status: DC | PRN
  Administered 2015-10-30: 80 mg via INTRAVENOUS
  Administered 2015-10-30 (×2): 30 mg via INTRAVENOUS

## 2015-10-30 MED ORDER — CIPROFLOXACIN IN D5W 400 MG/200ML IV SOLN
400.0000 mg | Freq: Once | INTRAVENOUS | Status: AC
  Administered 2015-10-30: 400 mg via INTRAVENOUS
  Filled 2015-10-30: qty 200

## 2015-10-30 MED ORDER — PROPOFOL 10 MG/ML IV BOLUS
INTRAVENOUS | Status: AC
  Filled 2015-10-30: qty 20

## 2015-10-30 MED ORDER — LACTATED RINGERS IV SOLN
INTRAVENOUS | Status: DC
  Administered 2015-10-30 (×2): via INTRAVENOUS

## 2015-10-30 MED ORDER — ONDANSETRON HCL 4 MG/2ML IJ SOLN
INTRAMUSCULAR | Status: AC
  Filled 2015-10-30: qty 2

## 2015-10-30 MED ORDER — SUGAMMADEX SODIUM 200 MG/2ML IV SOLN
INTRAVENOUS | Status: AC
  Filled 2015-10-30: qty 2

## 2015-10-30 MED ORDER — EPINEPHRINE HCL (NASAL) 0.1 % NA SOLN
NASAL | Status: AC
  Filled 2015-10-30: qty 30

## 2015-10-30 MED ORDER — FENTANYL CITRATE (PF) 100 MCG/2ML IJ SOLN
25.0000 ug | INTRAMUSCULAR | Status: DC | PRN
  Administered 2015-10-30: 25 ug via INTRAVENOUS

## 2015-10-30 MED ORDER — ONDANSETRON HCL 4 MG/2ML IJ SOLN
INTRAMUSCULAR | Status: DC | PRN
  Administered 2015-10-30: 4 mg via INTRAVENOUS

## 2015-10-30 MED ORDER — CEFAZOLIN SODIUM-DEXTROSE 2-3 GM-% IV SOLR: 2.0000 g | INTRAVENOUS | Status: DC

## 2015-10-30 MED ORDER — ROCURONIUM BROMIDE 100 MG/10ML IV SOLN
INTRAVENOUS | Status: DC | PRN
  Administered 2015-10-30: 30 mg via INTRAVENOUS

## 2015-10-30 MED ORDER — SUCCINYLCHOLINE CHLORIDE 20 MG/ML IJ SOLN
INTRAMUSCULAR | Status: AC
  Filled 2015-10-30: qty 2

## 2015-10-30 MED ORDER — SUGAMMADEX SODIUM 200 MG/2ML IV SOLN
INTRAVENOUS | Status: DC | PRN
  Administered 2015-10-30: 200 mg via INTRAVENOUS

## 2015-10-30 MED ORDER — 0.9 % SODIUM CHLORIDE (POUR BTL) OPTIME
TOPICAL | Status: DC | PRN
  Administered 2015-10-30: 1000 mL

## 2015-10-30 MED ORDER — FENTANYL CITRATE (PF) 100 MCG/2ML IJ SOLN
INTRAMUSCULAR | Status: AC
  Filled 2015-10-30: qty 2

## 2015-10-30 MED ORDER — FENTANYL CITRATE (PF) 250 MCG/5ML IJ SOLN
INTRAMUSCULAR | Status: AC
  Filled 2015-10-30: qty 5

## 2015-10-30 MED ORDER — LIDOCAINE HCL (CARDIAC) 20 MG/ML IV SOLN
INTRAVENOUS | Status: AC
  Filled 2015-10-30: qty 10

## 2015-10-30 MED ORDER — FENTANYL CITRATE (PF) 100 MCG/2ML IJ SOLN
INTRAMUSCULAR | Status: DC | PRN
  Administered 2015-10-30: 50 ug via INTRAVENOUS

## 2015-10-30 MED ORDER — ARTIFICIAL TEARS OP OINT
TOPICAL_OINTMENT | OPHTHALMIC | Status: AC
  Filled 2015-10-30: qty 3.5

## 2015-10-30 SURGICAL SUPPLY — 29 items
BALLN PULM 15 16.5 18 X 75CM (BALLOONS)
BALLN PULM 15 16.5 18X75 (BALLOONS)
BALLOON PULM 15 16.5 18X75 (BALLOONS) IMPLANT
BLADE SURG 15 STRL LF DISP TIS (BLADE) IMPLANT
BLADE SURG 15 STRL SS (BLADE)
CANISTER SUCTION 2500CC (MISCELLANEOUS) ×3 IMPLANT
CONT SPEC 4OZ CLIKSEAL STRL BL (MISCELLANEOUS) ×2 IMPLANT
COVER MAYO STAND STRL (DRAPES) ×3 IMPLANT
COVER TABLE BACK 60X90 (DRAPES) ×3 IMPLANT
DRAPE PROXIMA HALF (DRAPES) ×3 IMPLANT
DRSG TELFA 3X8 NADH (GAUZE/BANDAGES/DRESSINGS) ×3 IMPLANT
GAUZE SPONGE 4X4 16PLY XRAY LF (GAUZE/BANDAGES/DRESSINGS) ×3 IMPLANT
GLOVE BIOGEL M 7.0 STRL (GLOVE) ×3 IMPLANT
GLOVE SURG SS PI 8.0 STRL IVOR (GLOVE) ×2 IMPLANT
GUARD TEETH (MISCELLANEOUS) ×2 IMPLANT
KIT BASIN OR (CUSTOM PROCEDURE TRAY) ×3 IMPLANT
KIT ROOM TURNOVER OR (KITS) ×3 IMPLANT
NDL 18GX1X1/2 (RX/OR ONLY) (NEEDLE) IMPLANT
NDL HYPO 25GX1X1/2 BEV (NEEDLE) IMPLANT
NEEDLE 18GX1X1/2 (RX/OR ONLY) (NEEDLE) ×3 IMPLANT
NEEDLE HYPO 25GX1X1/2 BEV (NEEDLE) IMPLANT
NS IRRIG 1000ML POUR BTL (IV SOLUTION) ×3 IMPLANT
PAD ARMBOARD 7.5X6 YLW CONV (MISCELLANEOUS) ×4 IMPLANT
PAD DRESSING TELFA 3X8 NADH (GAUZE/BANDAGES/DRESSINGS) IMPLANT
PATTIES SURGICAL .5 X3 (DISPOSABLE) IMPLANT
SURGILUBE 2OZ TUBE FLIPTOP (MISCELLANEOUS) ×1 IMPLANT
TOWEL OR 17X24 6PK STRL BLUE (TOWEL DISPOSABLE) ×4 IMPLANT
TUBE CONNECTING 12'X1/4 (SUCTIONS) ×1
TUBE CONNECTING 12X1/4 (SUCTIONS) ×2 IMPLANT

## 2015-10-30 NOTE — Progress Notes (Signed)
Patient returning to [facility]  her private residence.  Spoke with neighbor, Mrs Cherylann Parr who will be staying with patient upon [facility].   Reviewed discharge instructions with her and sent written copies with patient . Patient traveling back to [facility] in security car.

## 2015-10-30 NOTE — Progress Notes (Signed)
Spoke to Dr. Wilburn Cornelia about patient allergy to Ampicillin, new orders received.

## 2015-10-30 NOTE — H&P (Signed)
[name] is an 82 y.o. female.   Chief Complaint: tongue base lesion HPI: Hx of sore throat and rt otalgia, Non-smoker  Past Medical History  Diagnosis Date  . Nasal polyps   . Osteoporosis   . Mitral valve disorders   . Chronic rhinitis   . Unspecified urinary incontinence   . Asthma   . Hyperlipidemia   . Hypertension   . Urge incontinence   . Unspecified hypothyroidism   . Bilateral claudication of lower limb ([place]) 08/29/2013    Bilateral posterior thighs  . Aortic atherosclerosis ([place]) 08/29/2013    Identified on Aortic ultrasound exam 2008   . Bullous pemphigoid 02/2015    Dr. Wilhemina Bonito  . Arthritis     Past Surgical History  Procedure Laterality Date  . Nasal polyp surgery  03/2002    x2 Dr Wilburn Cornelia  . Tonsillectomy    . Total abdominal hysterectomy w/ bilateral salpingoophorectomy  1970's  . Bunionectomy Left 12/1998  . Milk cyst right breast    . Breast lumpectomy Right 1953  . Bunionectomy Right 02/2000  . Eye surgery Bilateral 02/2000    Cataract removal  . Root canal  02/2014    Family History  Problem Relation Age of Onset  . Diabetes Mother     Adult onset  . Arthritis Mother   . Breast cancer Mother 103  . Heart attack Father   . Diabetes Father     Adult onset  . Heart disease Father   . Heart Problems Father     Arrhythmia requiring pacemaker  . Diabetes type II      sibling  . Allergies      1/2 sister(father's side)  . Diabetes Brother   . Fibromyalgia Daughter    Social History:  reports that she has never smoked. She has never used smokeless tobacco. She reports that she drinks about 1.2 oz of alcohol per week. She reports that she does not use illicit drugs.  Allergies:  Allergies  Allergen Reactions  . Sulfa Antibiotics   . Ampicillin Swelling  . Chlorine   . Codeine   . Macrobid [Nitrofurantoin Monohydrate Macrocrystals]   . Statins   . Trimethoprim     Medications Prior to Admission  Medication Sig Dispense Refill  .  acetaminophen (TYLENOL) 500 MG tablet Take 500 mg by mouth every 6 (six) hours as needed for mild pain, moderate pain or headache.    . cholecalciferol (VITAMIN D) 1000 UNITS tablet Take 2,000 Units by mouth daily.    Marland Kitchen levothyroxine (SYNTHROID, LEVOTHROID) 75 MCG tablet TAKE 1 TABLET BY MOUTH EVERY MORNING 90 tablet 1  . methotrexate (RHEUMATREX) 2.5 MG tablet Take 15 mg by mouth every Friday. Take 3 tablets in morning and 3 tablets at bedtime on Friday    . mirabegron ER (MYRBETRIQ) 50 MG TB24 tablet Take 50 mg by mouth daily as needed (urinary incontinence).    . predniSONE (DELTASONE) 5 MG tablet Take 2.5 mg by mouth daily with breakfast.   0    No results found for this or any previous visit (from the past 48 hour(s)). No results found.  Review of Systems  Constitutional: Negative.   HENT: Positive for ear pain and sore throat.   Respiratory: Negative.   Cardiovascular: Negative.   Gastrointestinal: Negative.     Blood pressure 170/65, pulse 89, temperature 96.8 F (36 C), temperature source Oral, resp. rate 20, height 5\' 2"  (1.575 m), weight 66.679 kg (147 lb), SpO2 100 %.  Physical Exam  Constitutional: She is oriented to person, place, and time. She appears well-developed and well-nourished.  HENT:  Normal oral mucosa and anterior tongue  Neck: Normal range of motion. Neck supple.  Cardiovascular: Normal rate.   Respiratory: Effort normal.  GI: Soft.  Musculoskeletal: Normal range of motion.  Lymphadenopathy:    She has cervical adenopathy.  Neurological: She is alert and oriented to person, place, and time.     Assessment/Plan Adm for OP bx of BOT under GA.  Lashannon Bresnan 10/30/2015, 11:07 AM

## 2015-10-30 NOTE — Anesthesia Preprocedure Evaluation (Addendum)
Anesthesia Evaluation  Patient identified by MRN, date of birth, ID band Patient awake    Reviewed: Allergy & Precautions, NPO status , Patient's Chart, lab work & pertinent test results  History of Anesthesia Complications Negative for: history of anesthetic complications  Airway Mallampati: II  TM Distance: >3 FB Neck ROM: Full    Dental  (+) Dental Advisory Given, Teeth Intact   Pulmonary asthma ,    breath sounds clear to auscultation       Cardiovascular + Valvular Problems/Murmurs MVP  Rhythm:Regular Rate:Normal     Neuro/Psych negative neurological ROS     GI/Hepatic negative GI ROS, Neg liver ROS,   Endo/Other  Hypothyroidism   Renal/GU negative Renal ROS     Musculoskeletal  (+) Arthritis , steroids,    Abdominal   Peds  Hematology   Anesthesia Other Findings   Reproductive/Obstetrics                            Anesthesia Physical Anesthesia Plan  ASA: III  Anesthesia Plan: General   Post-op Pain Management:    Induction: Intravenous  Airway Management Planned: Oral ETT  Additional Equipment:   Intra-op Plan:   Post-operative Plan: Extubation in OR  Informed Consent: I have reviewed the patients History and Physical, chart, labs and discussed the procedure including the risks, benefits and alternatives for the proposed anesthesia with the patient or authorized representative who has indicated his/her understanding and acceptance.   Dental advisory given  Plan Discussed with: CRNA and Surgeon  Anesthesia Plan Comments: (Plan routine monitors, GETA)        Anesthesia Quick Evaluation

## 2015-10-30 NOTE — Anesthesia Procedure Notes (Signed)
Procedure Name: Intubation Date/Time: 10/30/2015 11:52 AM Performed by: Gaylene Brooks Pre-anesthesia Checklist: Patient identified, Timeout performed, Emergency Drugs available, Suction available and Patient being monitored Patient Re-evaluated:Patient Re-evaluated prior to inductionOxygen Delivery Method: Circle system utilized Preoxygenation: Pre-oxygenation with 100% oxygen Intubation Type: IV induction Ventilation: Mask ventilation without difficulty Laryngoscope Size: Mac and 3 Grade View: Grade II Tube type: Oral Tube size: 6.5 mm Number of attempts: 2 Airway Equipment and Method: Stylet Placement Confirmation: breath sounds checked- equal and bilateral,  ETT inserted through vocal cords under direct vision,  positive ETCO2 and CO2 detector Secured at: 22 cm Tube secured with: Tape Dental Injury: Teeth and Oropharynx as per pre-operative assessment

## 2015-10-30 NOTE — Anesthesia Postprocedure Evaluation (Signed)
Anesthesia Post Note  Patient: [name]  Procedure(s) Performed: Procedure(s) (LRB): DIRECT LARYNGOSCOPY AND BIOPSY OF TONGUE MASS (N/A)  Patient location during evaluation: PACU Anesthesia Type: General Level of consciousness: awake and alert, oriented and patient cooperative Pain management: pain level controlled Vital Signs Assessment: post-procedure vital signs reviewed and stable Respiratory status: spontaneous breathing, nonlabored ventilation and respiratory function stable Cardiovascular status: blood pressure returned to baseline and stable Postop Assessment: no signs of nausea or vomiting Anesthetic complications: no    Last Vitals:  Filed Vitals:   10/30/15 1245 10/30/15 1250  BP: 144/61   Pulse: 91 88  Temp:    Resp: 18 13    Last Pain:  Filed Vitals:   10/30/15 1257  PainSc: 0-No pain                 Arthelia Callicott,E. Yul Diana

## 2015-10-30 NOTE — Brief Op Note (Signed)
10/30/2015  12:22 PM  PATIENT:  [name]  82 y.o. female  PRE-OPERATIVE DIAGNOSIS:  TONGUE MASS  POST-OPERATIVE DIAGNOSIS:  TONGUE MASS  PROCEDURE:  Procedure(s): DIRECT LARYNGOSCOPY AND BIOPSY OF TONGUE MASS (N/A)  SURGEON:  Surgeon(s) and Role:    * Jerrell Belfast, MD - Primary  PHYSICIAN ASSISTANT:   ASSISTANTS: none   ANESTHESIA:   general  EBL:   <50cc  BLOOD ADMINISTERED:none  DRAINS: none   LOCAL MEDICATIONS USED:  NONE  SPECIMEN:  Source of Specimen:  Tongue base lesion  DISPOSITION OF SPECIMEN:  PATHOLOGY  COUNTS:  YES  TOURNIQUET:  * No tourniquets in log *  DICTATION: .Other Dictation: Dictation Number [id number]  PLAN OF CARE: Discharge to home after PACU  PATIENT DISPOSITION:  PACU - hemodynamically stable.   Delay start of Pharmacological VTE agent (>24hrs) due to surgical blood loss or risk of bleeding: not applicable

## 2015-10-30 NOTE — Transfer of Care (Signed)
Immediate Anesthesia Transfer of Care Note  Patient: [name]  Procedure(s) Performed: Procedure(s): DIRECT LARYNGOSCOPY AND BIOPSY OF TONGUE MASS (N/A)  Patient Location: PACU  Anesthesia Type:General  Level of Consciousness: awake, oriented and sedated  Airway & Oxygen Therapy: Patient Spontanous Breathing and Patient connected to face mask oxygen  Post-op Assessment: Report given to RN, Post -op Vital signs reviewed and stable and Patient moving all extremities X 4  Post vital signs: Reviewed and stable  Last Vitals:  Filed Vitals:   10/30/15 0953  BP: 170/65  Pulse: 89  Temp: 36 C  Resp: 20    Complications: No apparent anesthesia complications

## 2015-10-31 NOTE — Op Note (Signed)
NAMEANARAH, [name]             ACCOUNT NO.:  [id number]  MEDICAL RECORD NO.:  [id number]  LOCATION:  [facility]                         FACILITY:  [facility]  PHYSICIAN:  Early Chars. Wilburn Cornelia, M.D.DATE OF BIRTH:  Dec 28, 1923  DATE OF PROCEDURE:  10/30/2015 DATE OF DISCHARGE:  10/30/2015                              OPERATIVE REPORT   PREOPERATIVE DIAGNOSIS:  Right exophytic tongue base mass.  POSTOPERATIVE DIAGNOSIS:  Right exophytic tongue base mass.  SURGICAL PROCEDURE:  Direct laryngoscopy and biopsy of tongue base mass.  ANESTHESIA:  General endotracheal.  COMPLICATIONS:  No complications.  ESTIMATED BLOOD LOSS:  Less than 50 mL.  DISPOSITION:  Patient transferred from the operating room to the recovery room in stable condition.  BRIEF HISTORY:  The patient is an 82 year old, otherwise healthy female who is a nonsmoker.  She was referred to our office for evaluation of right otalgia and tongue pain which have been ongoing for several months.  Examination in the office revealed a fullness in the right tongue base as well as adenopathy in the right neck.  A CT scan was obtained, which showed an enhancing mass involving the right tongue base and adding and abnormal adenopathy in the right lateral neck consistent with possible metastatic disease.  Given the patient's history and findings, I recommended laryngoscopy and biopsy of the tongue base mass under general anesthesia as an outpatient.  The risks and benefits of the procedure were discussed in detail with the patient who understood and agreed with our plan for surgery which is scheduled on elective basis on October 30, 2015.  DESCRIPTION OF PROCEDURE:  The patient was brought to the operating room, and placed in supine position on the operating table.  General endotracheal anesthesia was established without difficulty.  When the patient adequately anesthetized, she was positioned and prepped and draped.  A surgical time-out was  then performed with correct identification of the patient and the proposed surgical procedure.  When the patient prepared for surgery, she was draped.  A Dedo laryngoscope was then used to perform direct laryngoscopy.  A tooth guard was gently inserted covering and protecting the upper teeth.  The laryngoscope was then inserted.  The patient's supraglottis and larynx were normal.  Hypopharynx was normal without lesion or mass.  There was a moderate amount of bloody material within the airway which was suctioned as a result of her intubation.  The tongue base was then carefully palpated and inspected.  The right posterior tongue base was quite firm and swollen compared to the left and there was an exophytic bloody mass involving the soft tissue of the tongue base.  Multiple cup forceps biopsies were then obtained using a large cup forceps.  Biopsies were removed and sent to Pathology for gross microscopic evaluation. There was minimal bleeding.  The patient's oral cavity and oropharynx were irrigated and suctioned.  Orogastric tube was passed. Stomach contents were aspirated.  The patient was awakened from anesthetic.  She was extubated and then transferred from the operating room to the recovery room in stable condition.  There were no complications.          ______________________________ Early Chars Wilburn Cornelia, M.D.     DLS/MEDQ  D:  10/30/2015  T:  10/31/2015  Job:  [id number]

## 2015-11-02 ENCOUNTER — Encounter (HOSPITAL_COMMUNITY): Payer: Self-pay | Admitting: Otolaryngology

## 2015-11-11 ENCOUNTER — Other Ambulatory Visit (HOSPITAL_COMMUNITY): Payer: Self-pay | Admitting: Otolaryngology

## 2015-11-11 DIAGNOSIS — C109 Malignant neoplasm of oropharynx, unspecified: Secondary | ICD-10-CM

## 2015-11-13 ENCOUNTER — Encounter (HOSPITAL_COMMUNITY)
Admission: RE | Admit: 2015-11-13 | Discharge: 2015-11-13 | Disposition: A | Discharge status: Home / Self Care | Payer: Medicare Other | Source: Ambulatory Visit | Attending: Otolaryngology | Admitting: Otolaryngology

## 2015-11-13 DIAGNOSIS — C109 Malignant neoplasm of oropharynx, unspecified: Secondary | ICD-10-CM | POA: Insufficient documentation

## 2015-11-13 LAB — GLUCOSE, CAPILLARY: GLUCOSE-CAPILLARY: 83 mg/dL (ref 65–99)

## 2015-11-13 MED ORDER — FLUDEOXYGLUCOSE F - 18 (FDG) INJECTION
7.3000 | Freq: Once | INTRAVENOUS | Status: AC | PRN
  Administered 2015-11-13: 7.3 via INTRAVENOUS

## 2015-11-26 DIAGNOSIS — C801 Malignant (primary) neoplasm, unspecified: Secondary | ICD-10-CM | POA: Insufficient documentation

## 2015-11-26 DIAGNOSIS — C089 Malignant neoplasm of major salivary gland, unspecified: Secondary | ICD-10-CM | POA: Insufficient documentation

## 2015-12-02 ENCOUNTER — Telehealth: Payer: Self-pay | Admitting: *Deleted

## 2015-12-02 NOTE — Telephone Encounter (Signed)
  Oncology Nurse Navigator Documentation  Navigator Location: [facility] (12/02/15 1457) Navigator Encounter Type: Introductory phone call (12/02/15 1457)     Confirmed Diagnosis Date: 10/30/15 (12/02/15 1457)       Placed introductory call to new referral patient.  Introduced myself as the oncology nurse navigator that works with Dr. Isidore Moos to whom she has been referred by Dr. Nicolette Bang and with whom she has an appt this Friday 3:00 NE 3:30 Consult.  She confirmed understanding of referral and appt date/time.  I briefly explained my role as a navigator, indicated that I would be joining her during her appt.  She indicated her dtr will be joining her.  I confirmed understanding of the [facility] location, explained arrival and RadOnc registration process for appt.  I provided my contact information, encouraged her to call with questions/concerns before Friday.  She verbalized understanding of information provided, expressed appreciation for my call.  Gayleen Orem, RN, BSN, [facility] [place] [phone]                                 Time Spent with Patient: 15 (12/02/15 1457)

## 2015-12-02 NOTE — Progress Notes (Signed)
Head and Neck Cancer Location of Tumor / Histology:  10/30/2015 Diagnosis Tongue, biopsy, Right tongue base - MUCOEPIDERMOID CARCINOMA  Patient presented in November with a 1 month history of Right Tongue swelling and pain.   Biopsies of Right tongue base revealed: Mucoepidermoid Carcinoma  Nutrition Status Yes No Comments  Weight changes? [x]  []  She has lost about 9 lbs since late December  Swallowing concerns? [x]  []  She always feels like there is food in her throat. She is chewing on the right side of her mouth. She needs to cut up her food into small pieces to swallow. She is also is eating softer foods. She relates this to tongue pain.   PEG? []  [x]     Referrals Yes No Comments  Social Work? []  [x]    Dentistry? [x]  []  She has seen her regular dentist.   Swallowing therapy? []  [x]    Nutrition? []  [x]    Med/Onc? [x]  []  Dr. Georgeanna Lea at [place] Yes No Comments  Prior radiation? []  [x]    Pacemaker/ICD? []  [x]    Possible current pregnancy? []  [x]    Is the patient on methotrexate? [x]  []  Methotrexate 2.5 mg tablet. 15 mg by mouth every Friday. 3 tablets in the morning and 3 tablets at bedtime.   Tobacco/Marijuana/Snuff/ETOH use: Never a smoker  Past/Anticipated interventions by otolaryngology, if any: 11/11/15 Dr. 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 There seems to be a significant submucosal component in the right oral tongue that was not described on imaging.   We talked about surgical resection which would be quite extensive: most of the right oral tongue, the right tongue base, and some of the left tongue base, with at least a right neck dissection. She'd have certain voice and swallowing challenges after such a surgery. Might palliative therapy have some benefit? We'll discuss in tumor board once the workup is complete. She has spoken with Dr. Nicolette Bang and he does not recommend surgery.    I'll obtain her pathology report, have her slides reviewed here, and obtain a PET scan. I'll call with the results and we'll make therapy decisions after that: [phone]   Past/Anticipated interventions by medical oncology, if any: She has seen a Medical Oncologist at [facility] (Dr. Ready) and he does not recommend chemotherapy.      Current Complaints / other details:  She has pain in the side of her Right tongue, and it radiates to her Right ear which she says is sharp in nature. She takes tylenol for this and it works well.  PET 11/13/15 IMPRESSION: 1. Irregular hypermetabolic 3.9 x 3.3 cm mass in the right oropharynx as described (please note that this is considerably larger than the size reported on the 10/12/2015 neck CT study). 2. Hypermetabolic right level 2 and right level 3 cervical lymph node metastases. 3. No distant hypermetabolic metastatic disease. 4. Coronary atherosclerosis.  BP 156/53 mmHg  Pulse 73  Temp(Src) 97.5 F (36.4 C)  Ht 5\' 2"  (1.575 m)  Wt 143 lb 3.2 oz (64.955 kg)  BMI 26.18 kg/m2   Wt Readings from Last 3 Encounters:  12/04/15 143 lb 3.2 oz (64.955 kg)  10/30/15 147 lb (66.679 kg)  10/26/15 147 lb (66.679 kg)

## 2015-12-04 ENCOUNTER — Ambulatory Visit
Admission: RE | Admit: 2015-12-04 | Discharge: 2015-12-04 | Disposition: A | Discharge status: Home / Self Care | Payer: Medicare Other | Source: Ambulatory Visit | Attending: Radiation Oncology | Admitting: Radiation Oncology

## 2015-12-04 ENCOUNTER — Encounter: Payer: Self-pay | Admitting: *Deleted

## 2015-12-04 ENCOUNTER — Telehealth: Payer: Self-pay

## 2015-12-04 ENCOUNTER — Encounter: Payer: Self-pay | Admitting: Radiation Oncology

## 2015-12-04 VITALS — BP 156/53 | HR 73 | Temp 97.5°F | Ht 62.0 in | Wt 143.2 lb

## 2015-12-04 DIAGNOSIS — L120 Bullous pemphigoid: Secondary | ICD-10-CM | POA: Diagnosis not present

## 2015-12-04 DIAGNOSIS — C01 Malignant neoplasm of base of tongue: Secondary | ICD-10-CM | POA: Insufficient documentation

## 2015-12-04 DIAGNOSIS — Z79899 Other long term (current) drug therapy: Secondary | ICD-10-CM | POA: Diagnosis not present

## 2015-12-04 DIAGNOSIS — E785 Hyperlipidemia, unspecified: Secondary | ICD-10-CM | POA: Insufficient documentation

## 2015-12-04 DIAGNOSIS — E039 Hypothyroidism, unspecified: Secondary | ICD-10-CM | POA: Insufficient documentation

## 2015-12-04 DIAGNOSIS — Z510 Encounter for antineoplastic radiation therapy: Secondary | ICD-10-CM | POA: Diagnosis not present

## 2015-12-04 DIAGNOSIS — I059 Rheumatic mitral valve disease, unspecified: Secondary | ICD-10-CM | POA: Insufficient documentation

## 2015-12-04 DIAGNOSIS — I10 Essential (primary) hypertension: Secondary | ICD-10-CM | POA: Diagnosis not present

## 2015-12-04 DIAGNOSIS — Z8249 Family history of ischemic heart disease and other diseases of the circulatory system: Secondary | ICD-10-CM | POA: Diagnosis not present

## 2015-12-04 DIAGNOSIS — Z803 Family history of malignant neoplasm of breast: Secondary | ICD-10-CM | POA: Diagnosis not present

## 2015-12-04 DIAGNOSIS — M810 Age-related osteoporosis without current pathological fracture: Secondary | ICD-10-CM | POA: Diagnosis not present

## 2015-12-04 DIAGNOSIS — I700 Atherosclerosis of aorta: Secondary | ICD-10-CM | POA: Insufficient documentation

## 2015-12-04 DIAGNOSIS — J310 Chronic rhinitis: Secondary | ICD-10-CM | POA: Diagnosis not present

## 2015-12-04 DIAGNOSIS — R634 Abnormal weight loss: Secondary | ICD-10-CM

## 2015-12-04 DIAGNOSIS — F1010 Alcohol abuse, uncomplicated: Secondary | ICD-10-CM | POA: Diagnosis not present

## 2015-12-04 MED ORDER — LIDOCAINE VISCOUS 2 % MT SOLN: OROMUCOSAL | Status: DC

## 2015-12-04 MED ORDER — OXYCODONE HCL 5 MG PO TABS: 5.0000 mg | ORAL_TABLET | ORAL | Status: DC | PRN

## 2015-12-04 MED FILL — oxyCODONE HCL 5 MG TABS: 5 | 10 days supply | Qty: 60 | Fill #0

## 2015-12-04 MED FILL — LIDOCAINE 2% VISCOUS SOLN: 2 | 5 days supply | Qty: 100 | Fill #0

## 2015-12-04 NOTE — Telephone Encounter (Signed)
Ms. Langwell daughter Gerardine Band called who lives in [place]. She would like to be involved in her mother's appointment to be included in the discussion about her mother's future care. She has many concerns about the side effects of radiation, and quality of life her mother may experience after radiation is completed. I suggested she have her mother or other sister call her and have her on speaker phone during the appointment, that way she can hear Dr. Pearlie Oyster recommendations and opinions. She appreciated the phone call, and plans to be involved in the meeting.

## 2015-12-04 NOTE — Progress Notes (Signed)
Radiation Oncology         [phone] ________________________________  Initial outpatient Consultation  Name: [name] MRN: [id number]  Date: 12/04/2015  DOB: 29-Nov-1923  [id number], [name], DO  Reed, [name] L, DO   REFERRING PHYSICIAN: Gayland Curry, DO  DIAGNOSIS:    ICD-9-CM ICD-10-CM   1. Malignant neoplasm of base of tongue (HCC) 141.0 C01 oxyCODONE (OXY IR/ROXICODONE) 5 MG immediate release tablet     lidocaine (XYLOCAINE) 2 % solution     TSH     Ambulatory referral to Social Work     Ambulatory referral to Physical Therapy     Amb Referral to Nutrition and Diabetic E     Referral to Neuro Rehab     Ambulatory referral to Dentistry  2. 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 female who presented with pain and a sandpaper-like sensation on her tongue as well as right ear pain and pain with swallowing in October 2016. She also had a right neck mass. She was referred by her dentist to Dr. Wilburn Cornelia and he biopsied a right tongue base mass which showed mucoepidermoid carcinoma. The CT in December of the neck showed a 14 mm base of tongue mass and malignant nodes in the right neck. She also underwent a PET scan on 11/23/2015 which is negative for distant metastases but revealing right neck adenopathy and a large base of tongue mass that is 3.3 x 3.9 cm in axial dimension. Dr. Nicolette Bang at [facility] does not recommend surgery and Dr. Barrington Ellison at [facility] does not recommend chemotherapy nor immunotherapy.  She often feels like there is food in her throat. She is chewing on the right side of her mouth. She needs to cut up her food into small pieces to swallow. She is also eating softer foods. She relates this to tongue pain. She has lost 9 pounds since late December. She states that she has not had much of an appetite. She has pain in the side of the right tongue and it  radiates to her right ear which she says is sharp in nature. She takes tylenol for this and it works to some extent.  She is on methotrexate for Bullous Pemphigoid,which is well controlled. Also on Prednisone.  PREVIOUS RADIATION THERAPY: No  PAST MEDICAL HISTORY:  has a past medical history of Nasal polyps; Osteoporosis; Mitral valve disorders; Chronic rhinitis; Unspecified urinary incontinence; Asthma; Hyperlipidemia; Hypertension; Urge incontinence; Unspecified hypothyroidism; Bilateral claudication of lower limb ([place]) (08/29/2013); Aortic atherosclerosis ([place]) (08/29/2013); Bullous pemphigoid (02/2015); and Arthritis.    PAST SURGICAL HISTORY: Past Surgical History  Procedure Laterality Date  . Nasal polyp surgery  03/2002    x2 Dr Wilburn Cornelia  . Tonsillectomy    . Total abdominal hysterectomy w/ bilateral salpingoophorectomy  1970's  . Bunionectomy Left 12/1998  . Milk cyst right breast    . Breast lumpectomy Right 1953  . Bunionectomy Right 02/2000  . Eye surgery Bilateral 02/2000    Cataract removal  . Root canal  02/2014  . Direct laryngoscopy N/A 10/30/2015    Procedure: DIRECT LARYNGOSCOPY AND BIOPSY OF TONGUE MASS;  Surgeon: Jerrell Belfast, MD;  Location: [facility] OR;  Service: ENT;  Laterality: N/A;    FAMILY HISTORY: family history includes Arthritis in her mother; Breast cancer (age of onset: 39) in her mother; Diabetes in her brother, father, and mother;  Fibromyalgia in her daughter; Heart Problems in her father; Heart attack in her father; Heart disease in her father.  SOCIAL HISTORY:  reports that she has never smoked. She has never used smokeless tobacco. She reports that she drinks about 1.2 oz of alcohol per week. She reports that she does not use illicit drugs.  ALLERGIES: Sulfa antibiotics; Ampicillin; Chlorine; Codeine; Macrobid; Statins; and Trimethoprim  MEDICATIONS:  Current Outpatient Prescriptions  Medication Sig Dispense Refill  . acetaminophen (TYLENOL) 500 MG tablet  Take 500 mg by mouth every 6 (six) hours as needed for mild pain, moderate pain or headache.    . cholecalciferol (VITAMIN D) 1000 UNITS tablet Take 2,000 Units by mouth daily.    . folic acid (FOLVITE) 1 MG tablet Take by mouth.    . levothyroxine (SYNTHROID, LEVOTHROID) 75 MCG tablet TAKE 1 TABLET BY MOUTH EVERY MORNING 90 tablet 1  . methotrexate (RHEUMATREX) 2.5 MG tablet Take 15 mg by mouth every Friday. Take 3 tablets in morning and 3 tablets at bedtime on Friday    . mirabegron ER (MYRBETRIQ) 50 MG TB24 tablet Take 50 mg by mouth daily as needed (urinary incontinence).    . predniSONE (DELTASONE) 5 MG tablet Take 2.5 mg by mouth daily with breakfast.   0  . lidocaine (XYLOCAINE) 2 % solution Patient: Mix 1part 2% viscous lidocaine, 1part H20. Swish and/or swallow 36mL of this mixture, 39min before meals and at bedtime, up to QID 100 mL 5  . oxyCODONE (OXY IR/ROXICODONE) 5 MG immediate release tablet Take 1 tablet (5 mg total) by mouth every 4 (four) hours as needed for severe pain. 60 tablet 0   No current facility-administered medications for this encounter.    REVIEW OF SYSTEMS:  Notable for that above.   PHYSICAL EXAM:  height is 5\' 2"  (1.575 m) and weight is 143 lb 3.2 oz (64.955 kg). Her temperature is 97.5 F (36.4 C). Her blood pressure is 156/53 and her pulse is 73.   General: Alert and oriented, in no acute distress HEENT: Head is normocephalic. Extraocular movements are intact. Oropharynx is notable for moist mucus membranes. Teeth are in good repair. No tonsil masses. No masses along the palate. Tongue is midline. Mass arising from the posterior right oral tongue with a slightly exophytic appearance. It crosses midline and appears to extend  from the right base of tongue. Tongue mass is at least 4 cm in greatest dimension. Neck: Neck is notable for posterior to the right angle of the mandible there is a palpable mass. In the right level 3 region there is a palpable mass  underneath the sternocleidomastoid region. Neck mass each are about 2 cm. Heart: Regular in rate and rhythm with no murmurs, rubs, or gallops. Chest: Clear to auscultation bilaterally, with no rhonchi, wheezes, or rales. Abdomen: Soft, nontender, nondistended, with no rigidity or guarding. Lymphatics: see Neck Exam Psychiatric: Judgment and insight are intact. Affect is appropriate. Neuro : no focalities Skin: hyperpigmented, macular, large patch on right check   ECOG = 1  0 - Asymptomatic (Fully active, able to carry on all predisease activities without restriction)  1 - Symptomatic but completely ambulatory (Restricted in physically strenuous activity but ambulatory and able to carry out work of a light or sedentary nature. For example, light housework, office work)  2 - Symptomatic, <50% in bed during the day (Ambulatory and capable of all self care but unable to carry out any work activities. Up and about more than 50%  of waking hours)  3 - Symptomatic, >50% in bed, but not bedbound (Capable of only limited self-care, confined to bed or chair 50% or more of waking hours)  4 - Bedbound (Completely disabled. Cannot carry on any self-care. Totally confined to bed or chair)  5 - Death   Eustace Pen MM, Creech RH, Tormey DC, et al. [phone]). "Toxicity and response criteria of the [facility] Group". [place] Oncol. 5 (6): 649-55   LABORATORY DATA:  Lab Results  Component Value Date   WBC 9.9 10/23/2015   HGB 12.8 10/23/2015   HCT 40.7 10/23/2015   MCV 99.5 10/23/2015   PLT 390 10/23/2015   CMP     Component Value Date/Time   NA 141 10/23/2015 0940   NA 140 06/09/2015   K 4.3 10/23/2015 0940   CL 105 10/23/2015 0940   CO2 27 10/23/2015 0940   GLUCOSE 94 10/23/2015 0940   BUN 17 10/23/2015 0940   BUN 34* 06/09/2015   CREATININE 0.93 10/23/2015 0940   CREATININE 1.1 06/09/2015   CALCIUM 9.5 10/23/2015 0940   AST 16 06/09/2015   ALT 14 06/09/2015   ALKPHOS  54 06/09/2015   GFRNONAA 53* 10/23/2015 0940   GFRAA >60 10/23/2015 0940      Lab Results  Component Value Date   TSH 2.22 02/20/2014       RADIOGRAPHY: Nm Pet Image Initial (pi) Skull Base To Thigh  11/23/2015  ADDENDUM REPORT: 11/23/2015 08:01 ADDENDUM: Please note that there is linear hypermetabolism in the left sacral ala associated with mild sclerosis, in keeping with a nondisplaced subacute to chronic sacral insufficiency fracture. The original report is otherwise unchanged. These addended results will be called to the ordering clinician or representative by the Radiologist Assistant, and communication documented in the PACS or zVision Dashboard. Electronically Signed   By: Ilona Sorrel M.D.   On: 11/23/2015 08:01  11/23/2015  CLINICAL DATA:  Initial treatment strategy for mucoid epidermoid carcinoma of the oropharynx. EXAM: NUCLEAR MEDICINE PET SKULL BASE TO THIGH TECHNIQUE: 7.3 mCi F-18 FDG was injected intravenously. Full-ring PET imaging was performed from the skull base to thigh after the radiotracer. CT data was obtained and used for attenuation correction and anatomic localization. FASTING BLOOD GLUCOSE:  Value: 83 mg/dl COMPARISON:  10/12/2015 neck CT. FINDINGS: NECK There is an irregular hypermetabolic mass centered in the right soft palate, which involves the right palatine tonsil and tongue base, measuring approximately 3.9 x 3.3 cm with max SUV 18.3 (series 4/image 26). There are hypermetabolic right level 2 and right level 3 cervical lymph nodes. For example an enlarged 1.4 cm hypermetabolic right level 2 cervical node (series 4/ image 21) demonstrates max SUV 5.8. A mildly enlarged 1.0 cm hypermetabolic right level 3 cervical node (series 4/ image 36) demonstrates max SUV 3.8. No hypermetabolic level 1 neck lymph nodes. No hypermetabolic left neck lymph nodes. CHEST No hypermetabolic axillary, mediastinal or hilar nodes. Coronary atherosclerosis. No acute consolidative airspace  disease or significant pulmonary nodules. ABDOMEN/PELVIS No abnormal hypermetabolic activity within the liver, pancreas, adrenal glands, or spleen. No hypermetabolic lymph nodes in the abdomen or pelvis. Simple 6.4 cm renal cyst in the lower left kidney. Atherosclerotic nonaneurysmal abdominal aorta. Hysterectomy. SKELETON No focal hypermetabolic activity to suggest skeletal metastasis. IMPRESSION: 1. Irregular hypermetabolic 3.9 x 3.3 cm mass in the right oropharynx as described (please note that this is considerably larger than the size reported on the 10/12/2015 neck CT study). 2. Hypermetabolic right level  2 and right level 3 cervical lymph node metastases. 3. No distant hypermetabolic metastatic disease. 4. Coronary atherosclerosis. Electronically Signed: By: Ilona Sorrel M.D. On: 11/13/2015 16:31      IMPRESSION/PLAN:  This is a delightful patient with head and neck cancer. I recommend palliative radiotherapy for this patient. We talked about the pros and cons of a short course vs 7 week course of treatment.  Weighing the risks and benefits of aggressive treatment, I recommend an intermediate approach in treating her disease - 4 weeks of treatment, approximately 50 Gy in 20 fractions.  She is focussed on quality of life and avoiding severe side effects - I am concerned she would have trouble tolerating a 7 week regimen and a 2 week regimen may not provide a durable amount of tumor control.  She does not want a PEG tube and therefore limiting side effects is important   She is not pursuing surgery or chemotherapy after seeing specialists at [facility] and [facility] - She understands surgery is standard of care for upfront curative therapy.  Therefore, RT alone is palliative.   We discussed the potential risks, benefits, and side effects of radiotherapy. We talked in detail about acute and late effects. We discussed that some of the most bothersome acute effects may be mucositis, dysgeusia, salivary changes, skin  irritation, hair loss, dehydration, weight loss and fatigue. We talked about late effects which include but are not necessarily limited to dysphagia, hypothyroidism, nerve injury, spinal cord injury, xerostomia, trismus, and neck edema. No guarantees of treatment were given. A consent form was signed and placed in the patient's medical record. The patient is enthusiastic about proceeding with treatment. I look forward to participating in the patient's care.  She will talk to her primary care to discuss ceasing the methotrexate during radiation per my strong recommendation. This is a radiosensitizer and would increase side effects.   For pain,  I prescribed her Lidocaine and told her to mix it 50/50 with water to use before meals. I prescribed her Oxycodone 5 mg. She has been constipated for a couple weeks, so I recommended that she starts taking Miralax, especially if she takes the Oxycodone.  Simulation (treatment planning) will take place by 12/11/2015.  We also discussed that the treatment of head and neck cancer is a multidisciplinary process to maximize treatment outcomes and quality of life. For this reasons the following referrals have been or will be made:    Dentistry for dental evaluation,scatter guards, and /or advice on reducing risk of cavities, osteoradionecrosis, or other oral issues.   Nutritionist for nutrition support during and after treatment.   Speech language pathology for swallowing and/or speech therapy.   Social work for social support.    Physical therapy due to risk of lymphedema in neck and deconditioning.   Baseline labs including TSH.  Visit - face to face - lasted >70 minutes, over 50% spent on counseling and care coordination.  __________________________________________   [name], MD    This document serves as a record of services personally performed by [name], MD. It was created on her behalf by Lendon Collar, a trained medical scribe. The  creation of this record is based on the scribe's personal observations and the provider's statements to them. This document has been checked and approved by the attending provider.

## 2015-12-07 ENCOUNTER — Telehealth: Payer: Self-pay | Admitting: *Deleted

## 2015-12-07 DIAGNOSIS — C01 Malignant neoplasm of base of tongue: Secondary | ICD-10-CM | POA: Insufficient documentation

## 2015-12-07 NOTE — Progress Notes (Signed)
  Oncology Nurse Navigator Documentation  Navigator Location: [facility] (12/04/15 1500) Navigator Encounter Type: Initial RadOnc (12/04/15 1500)           Patient Visit Type: RadOnc (12/04/15 1500) Treatment Phase: Pre-Tx/Tx Discussion (12/04/15 1500) Barriers/Navigation Needs: No barriers at this time (12/04/15 1500)   Interventions: None required (12/04/15 1500)            Acuity: Level 1 (12/04/15 1500) Acuity Level 1: Initial guidance, education and coordination as needed;Minimal follow up required (12/04/15 1500)       Met with patient during initial consult with Dr. Isidore Moos.   She was accompanied by her dtr.  1. Further introduced myself as her Navigator, explained my role as a member of the Care Team.   2. Provided New Patient Information packet, discussed contents:  Contact information for physician(s), myself, other members of the Care Team.  Advance Directive information ([place] blue pamphlet with LCSW contact info).  She has ADLs, will bring copy next visit.  Fall Prevention Patient Safety Plan  Appointment Guideline  Financial Assistance Information sheet  [place] with highlight of [place] 3. Provided introductory explanation of radiation treatment including SIM planning and purpose of Aquaplast head and shoulder mask, showed them example.   4. They verbalized understanding of:  Proposed plan for 4 weeks M-F RT.  Probable CT SIM next F s/p visit with Dr. Enrique Sack for fitting of scatter guards.  Discontinuation of methotrexate during RT bco its action as a radiosensitizer.  They understand to communicate this to PCP. 5. I encouraged them to contact me with questions/concerns as treatments/procedures begin.  They verbalized understanding of information provided.    Gayleen Orem, RN, BSN, [place] at [place] [phone]     Time Spent with Patient: > 120 (12/04/15 1500)

## 2015-12-07 NOTE — Addendum Note (Signed)
Encounter addended by: Ernst Spell, RN on: 12/07/2015  8:21 AM<BR>     Documentation filed: Charges VN

## 2015-12-08 ENCOUNTER — Encounter: Payer: Self-pay | Admitting: *Deleted

## 2015-12-08 ENCOUNTER — Ambulatory Visit: Payer: Medicare Other | Admitting: Nutrition

## 2015-12-08 ENCOUNTER — Encounter (HOSPITAL_COMMUNITY): Payer: Self-pay | Admitting: Dentistry

## 2015-12-08 ENCOUNTER — Ambulatory Visit
Admission: RE | Admit: 2015-12-08 | Discharge: 2015-12-08 | Disposition: A | Discharge status: Home / Self Care | Payer: Medicare Other | Source: Ambulatory Visit | Attending: Radiation Oncology | Admitting: Radiation Oncology

## 2015-12-08 ENCOUNTER — Ambulatory Visit: Payer: Medicare Other

## 2015-12-08 ENCOUNTER — Encounter: Payer: Self-pay | Admitting: Radiation Oncology

## 2015-12-08 ENCOUNTER — Ambulatory Visit (HOSPITAL_COMMUNITY): Payer: Self-pay | Admitting: Dentistry

## 2015-12-08 ENCOUNTER — Ambulatory Visit: Payer: Medicare Other | Attending: Radiation Oncology | Admitting: Physical Therapy

## 2015-12-08 VITALS — BP 173/58 | HR 77 | Temp 97.6°F | Ht 62.0 in | Wt 142.5 lb

## 2015-12-08 VITALS — BP 137/47 | HR 81 | Temp 97.8°F

## 2015-12-08 DIAGNOSIS — K0889 Other specified disorders of teeth and supporting structures: Secondary | ICD-10-CM

## 2015-12-08 DIAGNOSIS — K036 Deposits [accretions] on teeth: Secondary | ICD-10-CM

## 2015-12-08 DIAGNOSIS — Z01818 Encounter for other preprocedural examination: Secondary | ICD-10-CM

## 2015-12-08 DIAGNOSIS — K0530 Chronic periodontitis, unspecified: Secondary | ICD-10-CM

## 2015-12-08 DIAGNOSIS — K08409 Partial loss of teeth, unspecified cause, unspecified class: Secondary | ICD-10-CM

## 2015-12-08 DIAGNOSIS — R1310 Dysphagia, unspecified: Secondary | ICD-10-CM | POA: Insufficient documentation

## 2015-12-08 DIAGNOSIS — R2689 Other abnormalities of gait and mobility: Secondary | ICD-10-CM | POA: Insufficient documentation

## 2015-12-08 DIAGNOSIS — C01 Malignant neoplasm of base of tongue: Secondary | ICD-10-CM

## 2015-12-08 DIAGNOSIS — R52 Pain, unspecified: Secondary | ICD-10-CM

## 2015-12-08 DIAGNOSIS — R29898 Other symptoms and signs involving the musculoskeletal system: Secondary | ICD-10-CM | POA: Diagnosis not present

## 2015-12-08 DIAGNOSIS — IMO0002 Reserved for concepts with insufficient information to code with codable children: Secondary | ICD-10-CM

## 2015-12-08 DIAGNOSIS — M270 Developmental disorders of jaws: Secondary | ICD-10-CM

## 2015-12-08 MED ORDER — SODIUM FLUORIDE 1.1 % DT GEL: DENTAL | Status: DC

## 2015-12-08 MED FILL — FLUORISHIELD 1.1% GEL: 1.1 % | 30 days supply | Qty: 114 | Fill #0

## 2015-12-08 NOTE — Patient Instructions (Signed)

## 2015-12-08 NOTE — Therapy (Signed)
[facility] [address] [place], [place], [address] Phone: [phone]   Fax:  [phone]  Speech Language Pathology Evaluation  Patient Details  Name: [name] MRN: [id number] Date of Birth: [birth year] Referring Provider: [name], M.D.  Encounter Date: 12/08/2015      End of Session - 12/08/15 1236    Visit Number 1   Number of Visits 3   Date for SLP Re-Evaluation 02/08/16   SLP Start Time 26   SLP Stop Time  1110   SLP Time Calculation (min) 45 min   Activity Tolerance Patient limited by pain      Past Medical History  Diagnosis Date  . Nasal polyps   . Osteoporosis   . Mitral valve disorders   . Chronic rhinitis   . Unspecified urinary incontinence   . Asthma   . Hyperlipidemia   . Hypertension   . Urge incontinence   . Unspecified hypothyroidism   . Bilateral claudication of lower limb ([place]) 08/29/2013    Bilateral posterior thighs  . Aortic atherosclerosis ([place]) 08/29/2013    Identified on Aortic ultrasound exam 2008   . Bullous pemphigoid 02/2015    Dr. Wilhemina Bonito  . Arthritis     Past Surgical History  Procedure Laterality Date  . Nasal polyp surgery  03/2002    x2 Dr Wilburn Cornelia  . Tonsillectomy    . Total abdominal hysterectomy w/ bilateral salpingoophorectomy  1970's  . Bunionectomy Left 12/1998  . Milk cyst right breast    . Breast lumpectomy Right 1953  . Bunionectomy Right 02/2000  . Eye surgery Bilateral 02/2000    Cataract removal  . Root canal  02/2014  . Direct laryngoscopy N/A 10/30/2015    Procedure: DIRECT LARYNGOSCOPY AND BIOPSY OF TONGUE MASS;  Surgeon: Jerrell Belfast, MD;  Location: [facility];  Service: ENT;  Laterality: N/A;    There were no vitals filed for this visit.  Visit Diagnosis: Dysphagia          SLP Evaluation [facility] - 12/08/15 1036    SLP Visit Information   SLP Received On 12/08/15   Referring Provider [name], M.D.   Medical Diagnosis  Cancer, Base of Tongue   Pain Assessment   Pain Score 3   tongue; worse c movement, better c rest; SLP adjusted tasks   Prior Functional Status   Cognitive/Linguistic Baseline Within functional limits   Cognition   Overall Cognitive Status Within Functional Limits for tasks assessed   Auditory Comprehension   Overall Auditory Comprehension Appears within functional limits for tasks assessed   Verbal Expression   Overall Verbal Expression Appears within functional limits for tasks assessed   Oral Motor/Sensory Function   Overall Oral Motor/Sensory Function Appears within functional limits for tasks assessed  dificult to fully assess oral ROM due to lingual pain   Labial ROM Within Functional Limits   Lingual ROM Other (Comment)  pt refused due to pain   Lingual Symmetry Within Functional Limits   Lingual Strength --  did not attempt due to pain   Lingual Coordination --  difficult to assess due to pain   Velum Within Functional Limits   Motor Speech   Overall Motor Speech Appears within functional limits for tasks assessed       Pt currently tolerates regular diet/thin liquids with small bite size due to pain in throat radiating to rt ear. Oral motor assessment as above. Some parts pt refused or difficult to assess due  to pain. POs: Pt ate applesauce and drank H2O without overt s/s aspiration. Thyroid elevation appeared adequate, and swallows appeared timely. Oral residue was not noted. Pt's swallow deemed WFL at this time (would have been WNL but pt modifying POs due to pain).   Because data states the risk for dysphagia during and after radiation treatment is high due to undergoing radiation tx, SLP taught pt about the possibility of reduced/limited ability for PO intake during rad tx. SLP encouraged pt to continue swallowing POs as far into rad tx as possible, even ingesting POs and/or completing HEP shortly after administration of pain meds.   SLP educated pt re: changes to  swallowing musculature after rad tx, and why adherence to dysphagia HEP provided today and PO consumption was necessary to inhibit muscular disuse atrophy and to reduce muscle fibrosis following rad tx. Pt demonstrated understanding of these things to SLP.    After eval tasks, SLP then developed a HEP for pt and pt was instructed how to perform exercises involving lingual, vocal, and pharyngeal strengthening in the presence of her daughter. SLP performed each exercise and pt return demonstrated each exercise. SLP ensured pt performance was correct prior to moving on to next exercise. Pt was instructed to complete this program once a day until rad begins, then 2-3 times a day, 6-7 days/week until 60 days after their last rad tx, then x2-3 a week after that. Pt appeared very timid about completing exercises to their recommended frequency. "This is a lot to do!" SLP assured pt HEP takes approx 20 minutes to complete at one time.                    SLP Education - 12/08/15 1131    Education provided Yes   Education Details HEP, late effects head/neck radiation on swallowing   Person(s) Educated Patient;Child(ren)   Methods Explanation;Demonstration;Verbal cues;Handout   Comprehension Verbalized understanding;Returned demonstration;Verbal cues required          SLP Short Term Goals - 12/08/15 1239    SLP SHORT TERM GOAL #1   Title pt will demo HEP with rare min A   Time 2   Period --  visits   Status New   SLP SHORT TERM GOAL #2   Title pt will tell SLP why she is completing HEP   Time 2   Period --  visits   Status New          SLP Long Term Goals - 12/08/15 1240    SLP LONG TERM GOAL #1   Title pt will tell SLP 2 s/s aspiration PNa   Time 3   Period --  vistis   Status New   SLP LONG TERM GOAL #2   Title pt will perfrom HEP indpendently   Time 3   Period --  visits   Status New   SLP LONG TERM GOAL #3   Title pt will tell SLP why a food journal may benefit  return to regular PO diet   Time 3   Period --  vistis   Status New          Plan - 12/08/15 1237    Clinical Impression Statement Pt with swalowing essentialy WNL at ths time, however the fact pt is undergoing head/neck radiation increases aspiration risk/risk of dysphagia. Pt would benefit from skilled ST to assess procedure with HEP and assess safety with POs during and after rad tx.   Speech Therapy Frequency --  once every 4 weeks, approx.   Duration --  60 days   Treatment/Interventions Aspiration precaution training;Diet toleration management by SLP;Internal/external aids;Compensatory strategies;Pharyngeal strengthening exercises;Oral motor exercises;SLP instruction and feedback;Patient/family education   Potential to Achieve Goals Good   Potential Considerations Pain level   SLP Home Exercise Plan provided today   Consulted and Agree with Plan of Care Patient          G-Codes - 2015/12/27 1241    Functional Assessment Tool Used noms -7   Functional Limitations Swallowing   Swallow Current Status [id number]) At least 1 percent but less than 20 percent impaired, limited or restricted  due to pain in throat radiating to ear during swallowing   Swallow Goal Status [id number]) At least 1 percent but less than 20 percent impaired, limited or restricted      Problem List Patient Active Problem List   Diagnosis Date Noted  . Malignant neoplasm of base of tongue ([place]) 12/07/2015  . Bullous pemphigoid 07/29/2015  . Aortic atherosclerosis ([place]) 08/29/2013  . Bilateral claudication of lower limb ([place]) 08/29/2013  . Hypothyroidism   . Right hip pain 01/16/2013  . Asthma   . Hyperlipidemia   . Hypertension   . Urge incontinence   . NASAL POLYP 05/24/2010  . RHINITIS 08/04/2007  . Allergic asthma 08/04/2007    [facility] ,MS, CCC-SLP  12-27-15, 12:42 PM  [facility] [address] [place], [place],  [address] Phone: [phone]   Fax:  [phone]  Name: [name] MRN: [id number] Date of Birth: 1924-08-30

## 2015-12-08 NOTE — Telephone Encounter (Addendum)
  Oncology Nurse Navigator Documentation  Navigator Location: [facility] (12/07/15 1401) Navigator Encounter Type: Telephone (12/07/15 1401) Telephone: [facility] Crocker Call;Appt Confirmation/Clarification;Education (12/07/15 1401)             Barriers/Navigation Needs: Education (12/07/15 1401) Education: Pain/ Symptom Management (12/07/15 1401)       Called Ms. Buendia following my conversation with her dtr Beth.  I confirmed her attendance at tomorrow's [facility], she indicated her dtr [name] will be accompanying her.  I asked how her throat pain is being controlled by the oxycodone Rxed by Dr. Isidore Moos last week Friday.  She responded that bco of concern of "how it would make me feel" , she tried 1/2 tablet the other HS, did not experience relief or SEs.  I supported her approach with half tablet, encouraged her to try whole tablet before going to bed.  She reported lack of appetite: "nothing tastes good", "hurts to swallow".  She denied trying Lidocaine, Rxed by Dr. Isidore Moos on Friday, before meals.  I encouraged her to use as directed, to eat and drink as tolerable, emphasizing importance of nutrition and hydration in preparation for tmt.   She voiced understanding of guidance provided.  Gayleen Orem, RN, BSN, [place] at [place] [phone]                      Time Spent with Patient: 30 (12/07/15 1401)

## 2015-12-08 NOTE — Progress Notes (Signed)
Patient was seen in head and neck clinic.  82 year old female diagnosed with squamous cell carcinoma of the base of tongue.  She is a patient of Dr. Isidore Moos.  Past medical history includes osteoporosis, hyperlipidemia, hypertension, hyperthyroidism.  Patient also has major urinary incontinence.  Medications include vitamin D, Synthroid, prednisone.  Labs were reviewed.  Height: 62 inches. Weight: 142.5 pounds February 14. Usual body weight: 153 pounds January 26. BMI: 26.88.  Patient will receive 4 weeks of radiation therapy. She lives at [facility] and typically eats dinner in the dining room. She has been consuming soft foods because it is painful to chew and swallow. She has not tried oral nutrition supplements. Patient is familiar with dietitian at [facility] and has talked to her in the past. Patient reports that she has been using "miracle fruit berries " which has improved taste alterations.  Patient reports constipation.  Nutrition diagnosis:  Food and nutrition related knowledge deficit related to new diagnosis of tongue cancer as evidenced by no prior need for nutrition related information. Unintended weight loss related to inadequate oral intake as evidenced by 7% weight loss.  Intervention:  Educated patient to consume frequent meals and snacks consisting of high-calorie, high-protein foods to promote weight maintenance. Reviewed increasing calories and protein Fact sheet Educated patient on strategies for improving constipation. Encouraged patient to seek out RD at [facility] as needed and that I would also try to see her weekly during treatment. Questions were answered.  Teach back method used and contact information was provided.  Monitoring, evaluation, goals: Patient will tolerate adequate calories and protein to minimize weight loss.  Next visit: To be scheduled weekly with treatment.  **Disclaimer: This note was dictated with voice recognition software. Similar  sounding words can inadvertently be transcribed and this note may contain transcription errors which may not have been corrected upon publication of note.**

## 2015-12-08 NOTE — Progress Notes (Signed)
DENTAL CONSULTATION  Date of Consultation:  12/08/2015 Patient Name:   [name] Date of Birth:   12-10-1923 Medical Record Number: [id number]  VITALS: BP 137/47 mmHg  Pulse 81  Temp(Src) 97.8 F (36.6 C) (Oral)  CHIEF COMPLAINT: Patient referred by Dr. Isidore Moos for a dental consultation.  HPI: [name] is an 82 year old female referred by Dr. Isidore Moos for a dental consultation. 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 Patient with anticipated palliative radiation therapy. Patient is now seen as part of a preradiation therapy dental protocol examination.  Patient currently denies acute toothaches, swellings, or abscesses. However, the patient is complaining of tongue pain that radiates to the ear at this time. The patient was last seen in August 2016 for an exam and cleaning. Patient is usually seen on an every 4 month basis. Patient sees Dr. Laurena Bering as her primary dentist. Patient denies having any partial dentures. Patient denies having any unmet dental needs.  PROBLEM LIST: Patient Active Problem List   Diagnosis Date Noted  . Malignant neoplasm of base of tongue ([place]) 12/07/2015    Priority: Medium  . Bullous pemphigoid 07/29/2015  . Aortic atherosclerosis ([place]) 08/29/2013  . Bilateral claudication of lower limb ([place]) 08/29/2013  . Hypothyroidism   . Right hip pain 01/16/2013  . Asthma   . Hyperlipidemia   . Hypertension   . Urge incontinence   . NASAL POLYP 05/24/2010  . RHINITIS 08/04/2007  . Allergic asthma 08/04/2007    PMH: Past Medical History  Diagnosis Date  . Nasal polyps   . Osteoporosis   . Mitral valve disorders   . Chronic rhinitis   . Unspecified urinary incontinence   . Asthma   . Hyperlipidemia   . Hypertension   . Urge incontinence   . Unspecified hypothyroidism   . Bilateral claudication of lower limb ([place]) 08/29/2013    Bilateral posterior thighs  . Aortic atherosclerosis ([place]) 08/29/2013     Identified on Aortic ultrasound exam 2008   . Bullous pemphigoid 02/2015    Dr. Wilhemina Bonito  . Arthritis     PSH: Past Surgical History  Procedure Laterality Date  . Nasal polyp surgery  03/2002    x2 Dr Wilburn Cornelia  . Tonsillectomy    . Total abdominal hysterectomy w/ bilateral salpingoophorectomy  1970's  . Bunionectomy Left 12/1998  . Milk cyst right breast    . Breast lumpectomy Right 1953  . Bunionectomy Right 02/2000  . Eye surgery Bilateral 02/2000    Cataract removal  . Root canal  02/2014  . Direct laryngoscopy N/A 10/30/2015    Procedure: DIRECT LARYNGOSCOPY AND BIOPSY OF TONGUE MASS;  Surgeon: Jerrell Belfast, MD;  Location: [facility];  Service: ENT;  Laterality: N/A;    ALLERGIES: Allergies  Allergen Reactions  . Sulfa Antibiotics   . Ampicillin Swelling  . Chlorine   . Codeine   . Macrobid [Nitrofurantoin Monohydrate Macrocrystals]   . Statins   . Trimethoprim     MEDICATIONS: Current Outpatient Prescriptions  Medication Sig Dispense Refill  . acetaminophen (TYLENOL) 500 MG tablet Take 500 mg by mouth every 6 (six) hours as needed for mild pain, moderate pain or headache.    . cholecalciferol (VITAMIN D) 1000 UNITS tablet Take 2,000 Units by mouth daily.    . folic acid (FOLVITE) 1 MG tablet Take by mouth.    . levothyroxine (SYNTHROID, LEVOTHROID) 75 MCG tablet TAKE 1 TABLET BY MOUTH EVERY MORNING 90 tablet 1  .  lidocaine (XYLOCAINE) 2 % solution Patient: Mix 1part 2% viscous lidocaine, 1part H20. Swish and/or swallow 79mL of this mixture, 19min before meals and at bedtime, up to QID 100 mL 5  . methotrexate (RHEUMATREX) 2.5 MG tablet Take 15 mg by mouth every Friday. Reported on 12/08/2015    . mirabegron ER (MYRBETRIQ) 50 MG TB24 tablet Take 50 mg by mouth daily as needed (urinary incontinence).    Mar[place] Kitchen oxyCODONE (OXY IR/ROXICODONE) 5 MG immediate release tablet Take 1 tablet (5 mg total) by mouth every 4 (four) hours as needed for severe pain. 60 tablet 0  . predniSONE  (DELTASONE) 5 MG tablet Take 2.5 mg by mouth daily with breakfast.   0   No current facility-administered medications for this visit.    LABS: Lab Results  Component Value Date   WBC 9.9 10/23/2015   HGB 12.8 10/23/2015   HCT 40.7 10/23/2015   MCV 99.5 10/23/2015   PLT 390 10/23/2015      Component Value Date/Time   NA 141 10/23/2015 0940   NA 140 06/09/2015   K 4.3 10/23/2015 0940   CL 105 10/23/2015 0940   CO2 27 10/23/2015 0940   GLUCOSE 94 10/23/2015 0940   BUN 17 10/23/2015 0940   BUN 34* 06/09/2015   CREATININE 0.93 10/23/2015 0940   CREATININE 1.1 06/09/2015   CALCIUM 9.5 10/23/2015 0940   GFRNONAA 53* 10/23/2015 0940   GFRAA >60 10/23/2015 0940   No results found for: INR, PROTIME No results found for: PTT  SOCIAL HISTORY: Social History   Social History  . Marital Status: Widowed    Spouse Name: N/A  . Number of Children: 3  . Years of Education: N/A   Occupational History  . retired Pharmacist, hospital    Social History Main Topics  . Smoking status: Never Smoker   . Smokeless tobacco: Never Used     Comment: Positive hx of passive tobacco smoke exposure  . Alcohol Use: 1.2 oz/week    2 Glasses of wine per week     Comment: 2 wine a week -social  . Drug Use: No  . Sexual Activity: No   Other Topics Concern  . Not on file   Social History Narrative   Widowed, married in [place], spouse died October 15, 1998.    Patient lives in [place] at [place] since 2007.    Patient has a living well.    She is a retired Pharmacist, hospital.    Never smoked   Alcohol: wine twice a week    Exercises 3 times a week    FAMILY HISTORY: Family History  Problem Relation Age of Onset  . Diabetes Mother     Adult onset  . Arthritis Mother   . Breast cancer Mother 64  . Heart attack Father   . Diabetes Father     Adult onset  . Heart disease Father   . Heart Problems Father     Arrhythmia requiring pacemaker  . Diabetes type II      sibling  . Allergies       1/2 sister(father's side)  . Diabetes Brother   . Fibromyalgia Daughter     REVIEW OF SYSTEMS: As per History of Present illness.  DENTAL HISTORY: CHIEF COMPLAINT: Patient referred by Dr. Isidore Moos for a dental consultation.  HPI: [name] is an 82 year old female referred by Dr. Isidore Moos for a dental consultation. 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 Patient with anticipated  palliative radiation therapy. Patient is now seen as part of a preradiation therapy dental protocol examination.  Patient currently denies acute toothaches, swellings, or abscesses. However, the patient is complaining of tongue pain that radiates to the ear at this time. The patient was last seen in August 2016 for an exam and cleaning. Patient is usually seen on an every 4 month basis. Patient sees Dr. Laurena Bering as her primary dentist. Patient denies having any partial dentures. Patient denies having any unmet dental needs.   DENTAL EXAMINATION: GENERAL: The patient is a well-developed, slightly built female in no acute distress. HEAD AND NECK: There is right neck lymphadenopathy. There is no apparent left neck lymphadenopathy. The patient denies acute TMJ symptoms. INTRAORAL EXAM: Patient has normal saliva. Patient has bilateral mandibular lingual tori. There is no evidence of oral abscess formation. DENTITION: Patient is missing tooth numbers 4, 12, 17, and 32. Tooth numbers 4 and 12 have been replaced with implants. PERIODONTAL: Patient has chronic periodontitis with plaque and calculus accumulations, generalized gingival recession, and tooth mobility as per dental charting form. Periodontal charting was deferred secondary to patient request. DENTAL CARIES/SUBOPTIMAL RESTORATIONS: No obvious dental caries are noted. Multiple abfraction lesions are noted.  ENDODONTIC: Patient denies acute pulpitis symptoms. There is no obvious periapical pathology noted on the  [place]: There are multiple crown restorations noted that appear to be acceptable. IMPLANTS: Patient has implants in the area of tooth numbers 4 and 12. PROSTHODONTIC: Patient denies having any partial dentures. OCCLUSION: Patient has a stable occlusion at this time.  RADIOGRAPHIC INTERPRETATION: An orthopantogram was taken today. Patient refused a full series of dental radiographs today secondary to tongue discomfort. There are missing tooth numbers 4, 12, 17, and 32. Tooth numbers 4 and 12 or replace with implants. There is moderate bone loss noted. There are multiple crown and bridge restorations. There are multiple restorations as well. Patient has had previous root canal therapy associated with tooth #31. No other obvious periapical pathology is noted. Bilateral maxillary sinuses appear to be well aerated.  ASSESSMENTS: 1. Squamous cell carcinoma of the base of tongue 2. Preradiation therapy dental protocol 3. Chronic periodontitis with bone loss 4. Generalized gingival recession 5. Accretions 6. Tooth mobility as per dental charting form 7. Missing teeth 8. Implants in the area of tooth numbers 4 and 12 9. Tongue pain/odynophagia secondary to the cancer 10. Bilateral mandibular lingual tori 11. Multiple abfraction lesions   PLAN/RECOMMENDATIONS: not interested in any dental extractions at this time. 1. I discussed the risks, benefits, and complications of various treatment options with the patient in relationship to her medical and dental conditions, anticipated radiation therapy, and radiation therapy side effects to include xerostomia, radiation caries, trismus, mucositis, taste changes, gum and jawbone changes, and risk for infection and osteoradionecrosis.. We discussed various treatment options to include no treatment, multiple extractions with alveoloplasty, pre-prosthetic surgery as indicated, periodontal therapy, dental restorations, root canal  therapy, crown and bridge therapy, implant therapy, and replacement of missing teeth as indicated. The patient currently is not interested in any dental extractions at this time. Patient does agree to proceed with impressions today for the fabrication of upper lower fluoride trays and scatter protection devices. These will be inserted tomorrow at 11 AM. A prescription for FluoriSHIELD was sent to [facility] with refills for one year.    2. Discussion of findings with medical team and coordination of future medical and dental care as needed.  I spent  in excess of  90 minutes during the conduct of this consultation and >50% of this time involved direct face-to-face encounter for counseling and/or coordination of the patient's care.    Lenn Cal, DDS

## 2015-12-08 NOTE — Progress Notes (Signed)
[name] is here for the [place]. Vitals, weight, medications reviewed. The patient knows to call me with any questions.

## 2015-12-08 NOTE — Patient Instructions (Signed)
SWALLOWING EXERCISES Do these 6 of the 7 days per week until 6 months after your last radiation day, then 2 times per week afterwards  1. Effortful Swallows - Squeeze hard with the muscles in your neck while you swallow your  saliva or a sip of water - Repeat 10-15 times, 2-3 times a day, and use whenever you eat or drink  2. Masako Swallow - swallow with your tongue sticking out - Stick tongue out past your teeth and gently bite tongue with your teeth - Swallow, while holding your tongue with your teeth - Repeat 15-20 times, 2-3 times a day *use a wet spoon if your mouth gets dry*  3. Pitch Raise - Repeat "he", once per second in as high of a pitch as you can - Repeat 20 times, 2-3 times a day  4. Shaker Exercise - head lift - Lie flat on your back in your bed or on a couch without pillows - Raise your head and look at your feet - KEEP YOUR SHOULDERS DOWN - HOLD FOR 45-60 SECONDS, then lower your head back down - Repeat 3 times, 2-3 times a day  5. Mendelsohn Maneuver - "half swallow" exercise - Start to swallow, and keep your Adam's apple up by squeezing hard with the muscles of the throat - Hold the squeeze for 5-7 seconds and then relax - Repeat 15-20 times, 2-3 times a day *use a wet spoon if your mouth gets dry*  6. Tongue Press - Press your entire tongue as hard as you can against the roof of your mouth for 3-5 seconds, then swallow hard - Repeat 10-15 times, 2-3 times a day  7. Breath Hold - Say "HUH!" loudly, then hold your breath for 3 seconds at your voice box - Repeat 20 times, 2-3 times a day

## 2015-12-08 NOTE — Progress Notes (Signed)
Paperwork ("living will", "healthcare power of attorney", "patient authorization") set for batch scanning, 12/08/15 Ardeen Fillers)

## 2015-12-08 NOTE — Telephone Encounter (Addendum)
  Oncology Nurse Navigator Documentation  Navigator Location: [facility] (12/07/15 1337) Navigator Encounter Type: Telephone (12/07/15 1337)               Barriers/Navigation Needs: Family concerns (12/07/15 1337)   Interventions: None required (12/07/15 1337)       Received call from patient's dtr [name] who lives in [place], [place]. She called to provide FYI, express concerns for her mother:  Compliance with pain medications recently prescribed by Dr. Isidore Moos,   Lack of nutritional and hydration intake,  Insight into her mother's personality, "she's very stubborn",  bco of concerns for her safety (she has had recent fall events) and medication compliance, her goal for her mother to transfer from independent living to re-hab as she moves forward with tmt.    She noted her mother has been experiencing urinary incontinence for the past year for which she is seeing a urologist but she refuses to follow his guidance.  The condition is currently "out of control", i.e. she has accidents throughout the day and night, refuses to wear protective undergarments, has to get up at HS to change sheets.  She minimizes intake of fluids to avoid accidents. She stated she and another out-of-state sister plan to come to [place] to share caregiving while their mother is receiving tmt. I thanked her for her call, indicated I would call her mother following our converstion.  Gayleen Orem, RN, BSN, [place] at [place] [phone]                    Time Spent with Patient: 45 (12/07/15 1337)

## 2015-12-08 NOTE — Therapy (Signed)
[place], [place], [address] Phone: [phone]   Fax:  [phone]  Physical Therapy Evaluation  Patient Details  Name: [name] MRN: [id number] Date of Birth: 1924-05-02 Referring Provider: Dr. [name]  Encounter Date: 12/08/2015      PT End of Session - 12/08/15 1329    Visit Number 1   Number of Visits 1   PT Start Time 0940   PT Stop Time 1025   PT Time Calculation (min) 45 min   Activity Tolerance Patient tolerated treatment well   Behavior During Therapy [facility] for tasks assessed/performed      Past Medical History  Diagnosis Date  . Nasal polyps   . Osteoporosis   . Mitral valve disorders   . Chronic rhinitis   . Unspecified urinary incontinence   . Asthma   . Hyperlipidemia   . Hypertension   . Urge incontinence   . Unspecified hypothyroidism   . Bilateral claudication of lower limb ([place]) 08/29/2013    Bilateral posterior thighs  . Aortic atherosclerosis ([place]) 08/29/2013    Identified on Aortic ultrasound exam 2008   . Bullous pemphigoid 02/2015    Dr. Wilhemina Bonito  . Arthritis     Past Surgical History  Procedure Laterality Date  . Nasal polyp surgery  03/2002    x2 Dr Wilburn Cornelia  . Tonsillectomy    . Total abdominal hysterectomy w/ bilateral salpingoophorectomy  1970's  . Bunionectomy Left 12/1998  . Milk cyst right breast    . Breast lumpectomy Right 1953  . Bunionectomy Right 02/2000  . Eye surgery Bilateral 02/2000    Cataract removal  . Root canal  02/2014  . Direct laryngoscopy N/A 10/30/2015    Procedure: DIRECT LARYNGOSCOPY AND BIOPSY OF TONGUE MASS;  Surgeon: Jerrell Belfast, MD;  Location: [place];  Service: ENT;  Laterality: N/A;    There were no vitals filed for this visit.  Visit Diagnosis:  Decreased ROM of neck - Plan: PT plan of care cert/re-cert  Pain aggravated by eating or drinking - Plan: PT plan of care cert/re-cert  Decreased mobility - Plan: PT  plan of care cert/re-cert      Subjective Assessment - 12/08/15 1310    Subjective Had been very active until a month ago when pain increased in right tongue and ear.   Patient is accompained by: Family member  daughter [name]   Pertinent History Pt. with right neck mass and tongue and ear pain.  CT scan in December showed mass at base of tonge and indicated malignant nodes.  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  There is neck adenopathy but no evidence of distant metastases.  Urinary incontinence.  h/o osteoporosis, leg claudication.   Patient Stated Goals get info from all head & neck clinic providers   Currently in Pain? Yes   Pain Score 8    Pain Location Other (Comment)  tongue to ear   Pain Orientation Right   Pain Descriptors / Indicators Other (Comment)  not consistent   Aggravating Factors  right of tongue hitting teeth, talking, and eating   Pain Relieving Factors pain meds   Effect of Pain on Daily Activities has awakened her at night            [facility] PT Assessment - 12/08/15 1318    Assessment   Medical Diagnosis right base of tongue carcinoma with neck adenopathy   Referring Provider Dr. [name]  Onset Date/Surgical Date 11/11/15   Precautions   Precautions None   Restrictions   Weight Bearing Restrictions No   Balance Screen   Has the patient fallen in the past 6 months No   Has the patient had a decrease in activity level because of a fear of falling?  No   Is the patient reluctant to leave their home because of a fear of falling?  No   Home Environment   Living Environment Other (Comment)   Additional Comments [facility] independent living   Prior Function   Level of Independence Independent with basic ADLs   Leisure until a month ago, did class at [facility] 30 minutes, 3 days a week "advanced chair exercises" which included walking, marching, etc.   Cognition   Overall Cognitive Status Within Functional Limits for  tasks assessed   Functional Tests   Functional tests Sit to Stand   Sit to Stand   Comments 9 times in 30 seconds (above average for age)   Posture/Postural Control   Posture/Postural Control Postural limitations   Postural Limitations Forward head;Rounded Shoulders  slight   ROM / Strength   AROM / PROM / Strength AROM   AROM   Overall AROM Comments Neck AROM WFL except 25% limitation in rotation bilaterally; shoulders WFL   Ambulation/Gait   Ambulation/Gait Yes   Ambulation/Gait Assistance 6: Modified independent (Device/Increase time)   Assistive device Straight cane  uses it a little   Stairs --  says she gets winded on stairs   Gait Comments daughter reports she "shuffles" a little; not assessed today.           LYMPHEDEMA/ONCOLOGY QUESTIONNAIRE - 12/08/15 1327    Type   Cancer Type base of tongue carcinoma   Lymphedema Assessments   Lymphedema Assessments Head and Neck   Head and Neck   4 cm superior to sternal notch around neck 34.2 cm   6 cm superior to sternal notch around neck 33.5 cm   8 cm superior to sternal notch around neck 34 cm                        PT Education - 12/08/15 1328    Education provided Yes   Education Details posture, neck ROM, walking or other exercise, staying active through treatment, lymphedema and PT info   Person(s) Educated Patient;Child(ren)   Methods Explanation;Handout   Comprehension Verbalized understanding                 Head and Neck Clinic Goals - 12/08/15 1335    Patient will be able to verbalize understanding of a home exercise program for cervical range of motion, posture, and walking.    Status Achieved   Patient will be able to verbalize understanding of proper sitting and standing posture.    Status Achieved   Patient will be able to verbalize understanding of lymphedema risk and availability of treatment for this condition.    Status Achieved           Plan -  12/08/15 1329    Clinical Impression Statement Very pleasant woman looking younger than her age accompanied by her daughter today.  Patient is having significant mouth and ear pain on the right side.  She used to exercise but hasn't in the last month since this has been bothering her.  She does have some neck ROM limitations and slight postural impairment.    She does have a  fear of falling; she has a cane and uses it a little.   Pt will benefit from skilled therapeutic intervention in order to improve on the following deficits Decreased activity tolerance;Decreased range of motion;Pain   Rehab Potential Good   Clinical Impairments Affecting Rehab Potential Will begin radiation treatment soon   PT Frequency One time visit   PT Treatment/Interventions Patient/family education   PT Next Visit Plan None at this time.   PT Home Exercise Plan neck ROM, walking and/or exericse class 5-6 days a week x 30 minutes   Recommended Other Services occupational therapy to give her a hand strengthening program and assist with difficulty opening medicine bottles and soup cans   Consulted and Agree with Plan of Care Patient          G-Codes - Dec 22, 2015 1335    Functional Assessment Tool Used clinical judgement   Functional Limitation Mobility: Walking and moving around   Mobility: Walking and Moving Around Current Status [phone]) At least 1 percent but less than 20 percent impaired, limited or restricted   Mobility: Walking and Moving Around Goal Status [phone]) At least 1 percent but less than 20 percent impaired, limited or restricted   Mobility: Walking and Moving Around Discharge Status [phone]) At least 1 percent but less than 20 percent impaired, limited or restricted       Problem List Patient Active Problem List   Diagnosis Date Noted  . Malignant neoplasm of base of tongue ([place]) 12/07/2015  . Bullous pemphigoid 07/29/2015  . Aortic atherosclerosis ([place]) 08/29/2013  . Bilateral claudication of  lower limb ([place]) 08/29/2013  . Hypothyroidism   . Right hip pain 01/16/2013  . Asthma   . Hyperlipidemia   . Hypertension   . Urge incontinence   . NASAL POLYP 05/24/2010  . RHINITIS 08/04/2007  . Allergic asthma 08/04/2007    Stamatia Masri 22-Dec-2015, 1:38 PM  [place], [place], [address] Phone: [phone]   Fax:  [phone]  Name: [name] MRN: [id number] Date of Birth: [birth year]   Serafina Royals, PT December 22, 2015 1:38 PM

## 2015-12-09 ENCOUNTER — Ambulatory Visit (HOSPITAL_COMMUNITY): Payer: Self-pay | Admitting: Dentistry

## 2015-12-09 ENCOUNTER — Telehealth: Payer: Self-pay | Admitting: *Deleted

## 2015-12-09 ENCOUNTER — Encounter: Payer: Self-pay | Admitting: *Deleted

## 2015-12-09 ENCOUNTER — Ambulatory Visit
Admission: RE | Admit: 2015-12-09 | Discharge: 2015-12-09 | Disposition: A | Discharge status: Home / Self Care | Payer: Medicare Other | Source: Ambulatory Visit | Attending: Radiation Oncology | Admitting: Radiation Oncology

## 2015-12-09 ENCOUNTER — Encounter (HOSPITAL_COMMUNITY): Payer: Self-pay | Admitting: Dentistry

## 2015-12-09 ENCOUNTER — Other Ambulatory Visit: Payer: Self-pay

## 2015-12-09 VITALS — BP 147/69 | HR 76 | Temp 97.6°F

## 2015-12-09 DIAGNOSIS — C01 Malignant neoplasm of base of tongue: Secondary | ICD-10-CM | POA: Diagnosis present

## 2015-12-09 DIAGNOSIS — Z463 Encounter for fitting and adjustment of dental prosthetic device: Secondary | ICD-10-CM | POA: Diagnosis not present

## 2015-12-09 DIAGNOSIS — Z01818 Encounter for other preprocedural examination: Secondary | ICD-10-CM

## 2015-12-09 DIAGNOSIS — Z510 Encounter for antineoplastic radiation therapy: Secondary | ICD-10-CM | POA: Diagnosis not present

## 2015-12-09 LAB — BUN AND CREATININE (CC13)
BUN: 21.5 mg/dL (ref 7.0–26.0)
Creatinine: 1 mg/dL (ref 0.6–1.1)
EGFR: 51 mL/min/{1.73_m2} — ABNORMAL LOW (ref >90)

## 2015-12-09 NOTE — Progress Notes (Addendum)
Confirmed no prior reactions/sensitivity to contrast dye.  BUN 21.5 and Creatinine 1.5 as of 10:27 am today. Unable to establish IV complete access with a 22 angiocath x 1 in right ventral forearm despite brisk blood return. Solicited assistance from Joaquim Lai, RN

## 2015-12-09 NOTE — Progress Notes (Signed)
Attempted IV start in left ac.  Blood return noted.  IV would not advance or flush.  Removed IV intact.  Pressure and bandaid applied.

## 2015-12-09 NOTE — Patient Instructions (Signed)

## 2015-12-09 NOTE — Progress Notes (Unsigned)
Head & Neck Multidisciplinary Clinic Clinical Social Work  Clinical Social Work met with patient/family at head & neck multidisciplinary clinic to offer support and assess for psychosocial needs.  Ms. Wragg was accompanied by his daughter, [name].  [name] resides in [place] and visits her mother often.  The patient has two additional daughters that live in [place] and [place], they plan to be present during patient's treatment to provide support.  Ms. Biondo resides at the [place] community in a house and has lived there over 10 years.  The patient shared she is very active and social among her community, but has recently become less active due to fatigue.  She reported feeling somewhat anxious, but plans to "take things day by day" and "not look too far ahead".  The patient had questions regarding side effects and expressed her desire to have the best quality of life possible moving forward.  Patient has three daughters, many friends in her community, and on site home care support available to her. The patient has met with LCSW at [facility].  CSW discussed high risk of anxiety or depressive symptoms while in cancer treatment and encouraged patient to reach out to CSW or [facility] CSW if she begins to feel anxious, overwhelmed, or sad. Ms. Debellis provided CSW with contact information for Katy Fitch, LCSW.  After [place] visit, CSW left voicemail for [employer], LCSW.  ONCBCN DISTRESS SCREENING 12/04/2015  Screening Type Initial Screening  Distress experienced in past week (1-10) 1  Emotional problem type Adjusting to illness  Information Concerns Type (No Data)  Physical Problem type Pain;Mouth sores/swallowing;Loss of appetitie;Talking;Constipation/diarrhea  Physician notified of physical symptoms Yes    Clinical Social Work briefly discussed Clinical Social Work role and [employer] support programs/services.  Clinical Social Work  encouraged patient to call with any additional questions or concerns.   Polo Riley, MSW, LCSW, OSW-C Clinical Social Worker [facility] [phone]

## 2015-12-09 NOTE — Progress Notes (Signed)
Head and Neck Cancer Simulation, IMRT treatment planning note   Outpatient  Diagnosis:    ICD-9-CM ICD-10-CM   1. Malignant neoplasm of base of tongue (HCC) 141.0 C01     The patient was taken to the CT simulator and laid in the supine position on the table. An Aquaplast head and shoulder mask was custom fitted to the patient's anatomy. High-resolution CT axial imaging was obtained of the head and neck without contrast (nursing could not place a viable IV). I verified that the quality of the imaging is good for treatment planning. Immobilization device was fabricated and supervised by me: Aquaplast mask.   Treatment planning note I plan to treat the patient with IMRT. I plan to treat the patient's tumor and upper neck nodes. I plan to treat to a total dose of 46 Gray in 20 fractions to the primary tumor and 40 Gy in 20 fractions to her right neck nodes. Concomitant boost technique will be used. I will use 6 fields with MLCs to block esophagus, parotid tissue, mandible, brain stem, spinal cord, oral cavity, mandible. I am limiting mandible/ tooth root dose per advice by dentistry (some teeth are loose).  A total of 7 Medically Necessary Treatment Devices were fabricated and supervised by me: Aquaplast mask and 6 fields with MLCs  3D planning Note  3D conformal  is an important modality to deliver adequate dose to the patient's at risk tissues while sparing the patient's normal structures, including the: esophagus, parotid tissue, mandible, brain stem, spinal cord, oral cavity, brachial plexus.  This justifies the use of 3D conformal planning  in the patient's treatment.    -----------------------------------  [name], MD

## 2015-12-09 NOTE — Telephone Encounter (Signed)
  Oncology Nurse Navigator Documentation  Navigator Location: [facility] (12/09/15 [id number]) Navigator Encounter Type: Telephone (12/09/15 0902) Telephone: Appt Confirmation/Clarification (12/09/15 0902)       Spoke with Ms. Barrington, informed her of 10:30 lab at [facility] prior to 11:00 [place] appt, 1:15 IV Start and 2:00 SIM at [facility].  She verbalized understanding.  Gayleen Orem, RN, BSN, [place] at [place] [phone]                                     Time Spent with Patient: 15 (12/09/15 0902)

## 2015-12-09 NOTE — Progress Notes (Signed)
12/09/2015  Patient Name:   [name] Date of Birth:   1923-12-27 Medical Record Number: [id number]  BP 147/69 mmHg  Pulse 76  Temp(Src) 97.6 F (36.4 C) (Oral)  [name] now presents for insertion of upper and lower fluoride trays and scatter protection devices.  PROCEDURE: Appliances were tried in and adjusted as needed. [place] Trismus device was previously  fabricated at 42 mm using 24 sticks. Postop instructions were provided and a written and verbal format concerning the use and care of appliances. All questions were answered. Patient to call for appointment if needed during radiation therapy, otherwise will see patient one month after radiation therapy is complete. Patient to call if questions or problems arise before then.  Lenn Cal, DDS

## 2015-12-10 NOTE — Progress Notes (Signed)
  Oncology Nurse Navigator Documentation  Navigator Location: [facility] (12/09/15 1325) Navigator Encounter Type: Treatment (12/09/15 1325)           Patient Visit Type: ZTAEWY (12/09/15 1325) Treatment Phase: CT SIM (12/09/15 1325)       To provide support and encouragement, care continuity and to assess for needs, met with Ms. Floren during her CT SIM.  She was accompanied by her dtr [name].  She completed procedure without difficulty despite delay d/t problem with IV and inability to obtain new access.  She reflected being overwhelmed with information.  I recognized the wealth of information being presented, she understands I will be with her throughout her tmts to answer questions, provide support.    I provided her samples of nutritional supplements and discount coupons per Dory Peru, RD.  She expressed appreciation. She understands she can call me at any time.  Gayleen Orem, RN, BSN, [place] at [place] [phone]                         Time Spent with Patient: 45 (12/09/15 1325)

## 2015-12-11 ENCOUNTER — Telehealth: Payer: Self-pay | Admitting: *Deleted

## 2015-12-11 DIAGNOSIS — Z510 Encounter for antineoplastic radiation therapy: Secondary | ICD-10-CM | POA: Diagnosis not present

## 2015-12-11 NOTE — Progress Notes (Addendum)
  Oncology Nurse Navigator Documentation  Navigator Location: [facility] (12/08/15 0825) Navigator Encounter Type: Clinic/MDC (12/08/15 0825)             Treatment Phase: Pre-Tx/Tx Discussion (12/08/15 0825) Barriers/Navigation Needs: Education (12/08/15 0825)       To provide support and care continuity, met with patient during [facility] [place].  She was accompanied by her dtr [name].  Provided her verbal and written overview of [place] and the clinicians who will be seeing her, encouraged her to ask questions during her time with them.  She reported taking whole tablet of oxycodone before bed last HS, acknowledged sleeping comfortably, did not feel "woozy" when she awakened.  I encouraged her to continue with medication as prescribed.  Spoke with her at end of [place], she expressed being overwhelmed with information.  I assured her her Care Team will provide ongoing support and education as she moves forward with treatment.  She voiced appreciation.   We reviewed today's 12:30 appt with Dr. Tommie Raymond for fitting of scatter guards, tomorrow's 11:00 follow-up with Dr. Enrique Sack, 1:15 IV Start and 2:00 CT SIM.  She understands I will join her during CT SIM.   Gayleen Orem, RN, BSN, [place] at [place] [phone]                       Time Spent with Patient: 2 (12/08/15 0825)

## 2015-12-11 NOTE — Telephone Encounter (Addendum)
  Oncology Nurse Navigator Documentation  Navigator Location: [facility] (12/11/15 1625) Navigator Encounter Type: Telephone (12/11/15 1625) Telephone: Symptom Mgt;Education;Outgoing Call (12/11/15 1625)             Barriers/Navigation Needs: Education (12/11/15 1625) Education: Pain/ Symptom Management (12/11/15 1625) Interventions: Education Method (12/11/15 1625)     Education Method: Verbal (12/11/15 1625)      Acuity: Level 2 (12/11/15 1625)   Acuity Level 2: Educational needs;Ongoing guidance and education throughout treatment as needed (12/11/15 1625)     Received call from [name]'s dtr [name] who lives in [place], [place].  She talks with her mother daily. She informed me that her mother:  Is having ongoing pain throat pain but is refusing to take Oxycodone for pain control because it makes her feel "woozy".  This is contrary to what she told me earlier this week when she had taken a full tablet before bed and was able to sleep comfortably, woke up without SE.  Is not eating, except for ice cream, not drinking, has not tried the nutritional supplements provided by [name] bc of pain with swallowing.  She refuses to use Lidocaine to make swallowing more comfortable.    She is not going to her favorite exercise class bc she is in pain.   I asked if it can be arranged for [facility] nursing to check in on her on a regular basis (she lives in a single home dwelling).  She replied that she will see if that can be arranged.   I offered to call [name] following our conversation.  She voiced appreciation, "she listens to you".  I called [name]:  She confirmed information provided by her dtr (I did not tell her of my conversation with her dtr.)  I expressed concern that she is not taking the Oxycodone or Lidocaine as prescribed to facilitate her comfort and ability to eat/drink.    I noted that her sense of "wooziness" when getting up in the morning may  be a sign dehydration.  She acknowledged this possibility.  I encouraged her try Lidocaine before dinner (she goes to the main building dining room) eat/drink as tolerable, take Oxycodone before bedtime and afterwards over the weekend as prescribed.   I stressed the importance of her managing pain and optimizing nutrition/hydration as she moves forward with the beginning of XRT next Wednesday.  She verbalized understanding.  I asked if she could arrange for a nurse to visit periodically to check on her well-being.  She indicated she would need to make a request for that to happen,  I encouraged her to do so.   I told her I would call her Monday morning to check on her well-being.  She expressed appreciation for my call.  This note was routed to Dr. Isidore Moos.  Gayleen Orem, RN, BSN, [place] at [place] [phone]       Time Spent with Patient: 30 (12/11/15 1625)

## 2015-12-14 ENCOUNTER — Telehealth: Payer: Self-pay | Admitting: *Deleted

## 2015-12-14 ENCOUNTER — Encounter: Payer: Self-pay | Admitting: Adult Health

## 2015-12-14 ENCOUNTER — Ambulatory Visit (HOSPITAL_BASED_OUTPATIENT_CLINIC_OR_DEPARTMENT_OTHER): Payer: Self-pay | Admitting: Adult Health

## 2015-12-14 ENCOUNTER — Encounter: Payer: Self-pay | Admitting: *Deleted

## 2015-12-14 VITALS — BP 154/67 | HR 95 | Temp 98.9°F | Resp 14 | Wt 140.2 lb

## 2015-12-14 DIAGNOSIS — C01 Malignant neoplasm of base of tongue: Secondary | ICD-10-CM

## 2015-12-14 DIAGNOSIS — Z510 Encounter for antineoplastic radiation therapy: Secondary | ICD-10-CM | POA: Diagnosis not present

## 2015-12-14 MED ORDER — TRAMADOL-ACETAMINOPHEN 37.5-325 MG PO TABS: 1.0000 | ORAL_TABLET | ORAL | Status: DC | PRN

## 2015-12-14 MED FILL — TRAMADOL-ACETAMINOPHN 37.5-: 37.5-325 | 10 days supply | Qty: 60 | Fill #0

## 2015-12-14 NOTE — Progress Notes (Signed)
  Oncology Nurse Navigator Documentation  Navigator Location: [facility] (12/14/15 1050) Navigator Encounter Type: Clinic/MDC (12/14/15 1050)           Patient Visit Type: RadOnc (12/14/15 1050) Treatment Phase: Pre-Tx/Tx Discussion (12/14/15 1050)   Education: Preparing for Upcoming Surgery/ Treatment (12/14/15 1050)       Education Method: Verbal (12/14/15 1050)       To provide support and encouragement, care continuity and to assess for needs, met with Ms. Daye during her appt with Mike Craze, NP. She verbalized understanding of the analgesic options offered, chose to try low dose tramadol-acetaminophen since she has a hx of success with acetaminophen.  I encouraged her to try a dose this afternoon after returning from her afternoon book club and then again before bedtime, noting the importance of determining the efficacy of this new pain medication. She voiced understanding of Gretchen's encouragement to use Lidocaine 30 min prior to meals and nightly fluoride tmts to mitigate tongue pain. She verbalized understanding of the constipating effect of pain medication, stated she uses glycerine suppositories and Dulcolax to maintain regular bowel movements.  She understands to call me or Elzie Rings with further needs/concerns.  Gayleen Orem, RN, BSN, [place] at [place] [phone]                 Time Spent with Patient: 75 (12/14/15 1050)

## 2015-12-14 NOTE — Progress Notes (Signed)
REASON FOR VISIT:  Routine follow-up for head & neck cancer   BRIEF ONCOLOGIC HISTORY:   No history exists.     INTERVAL HISTORY:  Ms. Ladage called Gayleen Orem, RN-H&N Nurse Navigator to report that she was experiencing severe pain to her tongue. She reports that she does not like the way the Oxycodone 5 mg tab makes her feel; she states that it is effective, but she feels "too out of it and loopy."  She tried taking 1/2 tab (0.25 mg) of the oxycodone, but this was not effective for her pain control.  She has taken Extra Strength Tylenol (500 mg) as well, which she states "worked quicker and didn't make me feel out of it, but it didn't decrease my pain all the way either."  She reports that pain is worse when she tries to eat and when she uses her fluoride trays at night.  Ms. Merino also reports having problems "off and on" with constipation that has been a chronic issue for her.  She takes stool softeners, Dulcolax pills, and glycerin suppositories with effectiveness.  Her LBM was 1-2 days ago per her report.  She states that she has no appetite and is likely not drinking enough water, although she carries a water bottle with her and is trying to increase her fluid consumption.  Overall, her mouth pain that radiates up to her right ear is her biggest concern at this time.      ADDITIONAL REVIEW OF SYSTEMS:  Review of Systems  Constitutional: Positive for weight loss. Negative for fever.  HENT:       Right tongue/mouth pain that radiates up to right ear  Gastrointestinal: Positive for nausea and constipation. Negative for blood in stool.       -"Woke up this morning with a little nausea, but I did not vomit."  -Chronic issues with constipation   Genitourinary: Positive for frequency. Negative for dysuria and hematuria.       -chronic urinary incontinence; reports having to get up many times at night to urinate   Neurological: Positive for weakness.     CURRENT MEDICATIONS:    Current Outpatient Prescriptions on File Prior to Visit  Medication Sig Dispense Refill  . acetaminophen (TYLENOL) 500 MG tablet Take 500 mg by mouth every 6 (six) hours as needed for mild pain, moderate pain or headache.    . cholecalciferol (VITAMIN D) 1000 UNITS tablet Take 2,000 Units by mouth daily.    . folic acid (FOLVITE) 1 MG tablet Take by mouth.    . levothyroxine (SYNTHROID, LEVOTHROID) 75 MCG tablet TAKE 1 TABLET BY MOUTH EVERY MORNING 90 tablet 1  . lidocaine (XYLOCAINE) 2 % solution Patient: Mix 1part 2% viscous lidocaine, 1part H20. Swish and/or swallow 20mL of this mixture, 34min before meals and at bedtime, up to QID 100 mL 5  . methotrexate (RHEUMATREX) 2.5 MG tablet Take 15 mg by mouth every Friday. Reported on 12/09/2015    . mirabegron ER (MYRBETRIQ) 50 MG TB24 tablet Take 50 mg by mouth daily as needed (urinary incontinence). Reported on 12/09/2015    . predniSONE (DELTASONE) 5 MG tablet Take 2.5 mg by mouth daily with breakfast.   0  . sodium fluoride (FLUORISHIELD) 1.1 % GEL dental gel Instill one drop of gel per tooth space of fluoride tray. Place over teeth for 5 minutes. Remove. Spit out excess. Repeat nightly. 120 mL 11   No current facility-administered medications on file prior to visit.  ALLERGIES:  Allergies  Allergen Reactions  . Sulfa Antibiotics   . Ampicillin Swelling  . Chlorine   . Codeine   . Macrobid [Nitrofurantoin Monohydrate Macrocrystals]   . Statins   . Trimethoprim      PHYSICAL EXAM:  Filed Vitals:   12/14/15 1100  BP: 154/67  Pulse: 95  Temp: 98.9 F (37.2 C)  Resp: 14   Filed Weights   12/14/15 1100  Weight: 140 lb 3.2 oz (63.594 kg)   Weight Date  140 lb 3.2 oz (63.594 kg) 12/14/15  142 lb 8 oz (64.638 kg) 12/08/15  143 lb 3.2 oz (64.955 kg) Pre-treatment: 12/04/15   General: Elderly female in no acute distress.  Accompanied in clinic today by Gayleen Orem, RN.  HEENT: Head is atraumatic and normocephalic.  Pupils equal  and reactive to light. Conjunctivae clear without exudate.  Sclerae anicteric. Oral mucosa is pink and moist.  Tongue pink and midline; palpable posterior tongue mass approximately 3-4 cm in diameter. Oropharynx is pink and moist, without lesions. Lymph: Right submandibular region with fullness/palpable mass.  No other cervical lymphadenopathy. No supraclavicular or infraclavicular lymphadenopathy.  Neck: Fullness and tenderness to palpation along right sternocleidomastoid.  Skin on neck is dry and intact.  Cardiovascular: Normal rate and rhythm. Respiratory: Clear to auscultation bilaterally. Chest expansion symmetric without accessory muscle use.  GI: Abdomen soft and round. Mild tenderness to palpation in suprapubic region.  Bowel sounds normoactive in 4 quadrants. No hepatosplenomegaly.  GU: Deferred.   Neuro: No focal deficits. Steady gait, although requires some assistance with transferring from chair to exam table ? due to muscle weakness/deconditioning.  Psych: Normal mood and affect for situation. Extremities: No edema, cyanosis, or clubbing.   Skin: Warm and dry.    LABORATORY DATA:  None at this visit.  DIAGNOSTIC IMAGING:  None at this visit.    ASSESSMENT & PLAN:  Ms. Blueford is a very pleasant 82 y.o. 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  She will begin treatment with palliative radiation to her head & neck on Wednesday, 12/16/15. Patient presents to clinic today to optimize her pain control pre-treatment.   1. Cancer of the right base of tongue: Ms. Reihl has elected not to pursue treatment with surgery or chemotherapy after consultations at both [facility] and [facility].  She will therefore proceed with palliative radiation therapy, under the care of Dr. Isidore Moos. She will begin treatment, as scheduled, on Wednesday, 12/16/15.    2. Methotrexate use: Ms. Pigott has been under the care of her dermatologist for bullous  pemphigoid, being treated with methotrexate.  Based on Dr. Pearlie Oyster recommendations, Ms. Nydegger has stopped the use of methotrexate and her dermatologist is aware per patient report.  I further reinforced the importance of her not taking the methotrexate (radiosensitizer) during treatment, as it would likely increase side effects of treatment (as previously discussed with the patient by Dr. Isidore Moos).  The patient is with verbal understanding of this plan and agrees with it.    3. Neoplasm-related pain: Optimizing pain control before treatment is critical for Ms. Lacomb. Her pain is impacting her ability to engage in a her normal daily activities; "last night I was hurting so bad I couldn't even watch my favorite show on [employer] because I couldn't concentrate on anything but my pain."  Given her age, sensitivity to low-dose oxycodone, and reported effectiveness to Tylenol, I have prescribed Tramadol/APAP 37.5/325 mg; take 1 tab every 4 hours as  needed for pain, #60, no refills.  My hope is that combination opiate/tylenol pain medication will offer her adequate relief without the "loopy" side effects that she does not like.  I did let her know that when starting a new pain medication, she may experience some of the similar side effects to the oxycodone, but my hope is they would be less severe with the Tramadol.  I explained the purpose and potential side effects of this medication. I gave her instructions on how to take the Tramadol, including "pre-medicating" before meals and at bedtime before fluoride tray use.  I explained that we may not be able to get her pain level to 0000000, but we can certainly decrease the pain to allow her to function more normally.  She is very agreeable to this.  Rick and I also reviewed the proper use and instructions for the Lidocaine solution.  We encouraged her to pre-medicate with both the Tramadol and Lidocaine before any potentially painful event (i.e. eating, fluoride trays,  etc.).  She is going to try this regimen for the next few days and I will follow-up with her to check on her pain regimen's efficacy.  I let her know that this is a relatively low-dose of the "stronger" pain medication, Tramadol, and we certainly have room to increase the dose, if needed.   I instructed her not to take the oxycodone and Tramadol at the same time for her safety. She expressed verbal understanding of this plan and agrees with it.     4. Constipation prevention/Adequate bowel regimen:  Constipation is reportedly a chronic issue for Ms. Fanguy.  I reinforced the importance of adequate bowel regimen, particularly when taking pain medications.  My goal for her would be to have a bowel movement daily or every other day.  We discussed different over-the-counter bowel regimens. Previously, the glycerin suppository has been very successful for her and I encouraged her to continue to use the suppositories, stool softeners, and laxatives as needed.  She is currently having a hard time with drinking Miralax, given it has to be mixed with water, and she admittedly is not doing a great job with drinking fluids.    5. At risk for dehydration: I encouraged her to consume adequate hydration daily and commended her efforts on carrying a water bottle with her to try to increase her fluid intake.  We discussed the importance of maintaining adequate fluids, particularly as she begins treatment, and her risk for dehydration becomes greater with potential side effects of treatment.  We will continue to monitor.    6. At risk for protein-calorie malnutrition: Ms. Milward already is experiencing dysgeusia and decreased appetite.  She has lost ~3 lbs in the past 10 days.  I encouraged her to think of her nutrition as part of her treatment regimen and that increased calories and protein are going to be critical to her healing as she begins and completes treatment.  She will continue her efforts and we will  continue to include Dory Peru, our dietitian, as appropriate.  We will continue to monitor.    Dispo:  -Of note: Code Status-DNR -I will follow-up with the patient in a few days to reassess her pain control with the Tramadol.  I gave her my business card and encouraged her to call myself or Gayleen Orem, RN if she has any needs or questions before her next visit here.   -I am happy to see her at any time in the future to  further optimize her pain and symptom control.    A total of 30 minutes was spent with this patient in face-to-face care, with greater than 50% of that time in counseling and care-coordination.    Mike Craze, NP [place] [phone]     (Coding/Billing notation: This patient is a no charge per Dr. Pablo Ledger, Medical Director of Survivorship; pt has never been seen by medical oncology)

## 2015-12-14 NOTE — Telephone Encounter (Signed)
  Oncology Nurse Navigator Documentation  Navigator Location: [facility] Onc (12/14/15 0930) Navigator Encounter Type: Telephone (12/14/15 0930) Telephone: Incoming Call;Symptom Mgt (12/14/15 0930)             Barriers/Navigation Needs: Coordination of Care (12/14/15 0930)   Interventions: Coordination of Care (12/14/15 0930)       [name] called to provide update on her well-being over the weekend. She reported:  Took full tablet of oxycodone 5 mg last HS before bed for pain mgt.  Slept well through the HS, "it must have worked", woke periodically to use the bathroom per her usual.  Woke up this morning with nausea (no emesis), felt "out of it, like I'm on another planet".  Current pain level 2/10.  No appetite, minimal oral intake - food or drink.  Did eat cottage cheese with fruit this morning, drink some coffee.  Tried to use fluoride trays per Dr. Ritta Slot guidance but experienced too much tongue pain.  She did not try the viscous Lidocaine per our discussion on Friday.  I encouraged her once again to try using before mealtime to facilitate swallowing.  I noted it may make the use of fluoride trays more comfortable as well.  "I'll maybe give it a try." I emphasized the importance of identifying the necessary medication that will provide pain control without the SEs she is experiencing.   I suggested she see Mike Craze, NP, for an acute care visit, she accepted offer.   I discussed with Elzie Rings who agreed to see her, coordinated a 11:00 AM appt this morning for Ms. Radermacher, indicated I would join her for the appt.   She verbalized appreciation for my assistance.  Gayleen Orem, RN, BSN, [place] at [place] [phone]                   Time Spent with Patient: 30 (12/14/15 0930)

## 2015-12-15 ENCOUNTER — Ambulatory Visit: Payer: Medicare Other | Admitting: Radiation Oncology

## 2015-12-16 ENCOUNTER — Telehealth: Payer: Self-pay | Admitting: *Deleted

## 2015-12-16 ENCOUNTER — Ambulatory Visit
Admission: RE | Admit: 2015-12-16 | Discharge: 2015-12-16 | Disposition: A | Discharge status: Home / Self Care | Payer: Medicare Other | Source: Ambulatory Visit | Attending: Radiation Oncology | Admitting: Radiation Oncology

## 2015-12-16 ENCOUNTER — Encounter: Payer: Medicare Other | Admitting: Internal Medicine

## 2015-12-16 ENCOUNTER — Ambulatory Visit: Payer: Medicare Other | Admitting: Radiation Oncology

## 2015-12-16 ENCOUNTER — Ambulatory Visit: Payer: Medicare Other

## 2015-12-16 ENCOUNTER — Encounter: Payer: Self-pay | Admitting: Adult Health

## 2015-12-16 ENCOUNTER — Encounter: Payer: Self-pay | Admitting: *Deleted

## 2015-12-16 DIAGNOSIS — C01 Malignant neoplasm of base of tongue: Secondary | ICD-10-CM | POA: Insufficient documentation

## 2015-12-16 DIAGNOSIS — Z510 Encounter for antineoplastic radiation therapy: Secondary | ICD-10-CM | POA: Diagnosis not present

## 2015-12-16 MED ORDER — SONAFINE EX EMUL
1.0000 "application " | Freq: Once | CUTANEOUS | Status: AC
  Administered 2015-12-16: 1 via TOPICAL
  Filled 2015-12-16: qty 45

## 2015-12-16 NOTE — Progress Notes (Signed)
Pt here for patient teaching.  Pt given Radiation and You booklet, Managing Acute Radiation Side Effects for Head and Neck Cancer handout, skin care instructions and Sonafine. Pt reports they have not watched the Radiation Therapy Education video, but were given the link to watch at home.  Reviewed areas of pertinence such as fatigue, mouth changes, skin changes, throat changes, breast swelling, cough, shortness of breath, earaches and taste changes . Pt able to give teach back of to pat skin, use unscented/gentle soap and drink plenty of water,apply Sonafine bid, avoid applying anything to skin within 4 hours of treatment and to use an electric razor if they must shave. Pt verbalizes understanding of information given and will contact nursing with any questions or concerns.     [email or web address]/whattoexpect/index

## 2015-12-16 NOTE — Progress Notes (Signed)
I briefly met with [name] today prior to her 1st radiation treatment along with Jordan Orem, RN-H&N Navigator.  [name] tells me that she has zero pain after starting the Tramadol/APAP, which is very reassuring. She is very pleased with her current pain regimen and was also very happy that the prescription only cost $4.30.  I met one of her daughters, [name], who was present for today's treatment as well.  I briefly updated her on our symptom management approach and the goals for palliation/local control of her cancer.  Her daughter expressed appreciation for the support of her mom.  I gave [name] several of my business cards to share with her family and encouraged her to call me with any questions or concerns.  [name] also knows to call me with any additional concerns.  I look forward to continuing to participate in her care, as needed.   Mike Craze, NP [place] [phone]

## 2015-12-16 NOTE — Telephone Encounter (Signed)
  Oncology Nurse Navigator Documentation  Navigator Location: [facility] (12/16/15 1510)   Telephone: Jerilee Hoh Confirmation/Clarification (12/16/15 1510)       Confirmed with patient rescheduled Port and Treat for 4:00 this afternoon.  She confirmed understanding, indicated dtr [name] will be joining her.  Gayleen Orem, RN, BSN, [place] at [place] [phone]                                     Time Spent with Patient: 15 (12/16/15 1510)

## 2015-12-17 ENCOUNTER — Telehealth: Payer: Self-pay | Admitting: Adult Health

## 2015-12-17 ENCOUNTER — Ambulatory Visit
Admission: RE | Admit: 2015-12-17 | Discharge: 2015-12-17 | Disposition: A | Discharge status: Home / Self Care | Payer: Medicare Other | Source: Ambulatory Visit | Attending: Radiation Oncology | Admitting: Radiation Oncology

## 2015-12-17 ENCOUNTER — Encounter: Payer: Self-pay | Admitting: Adult Health

## 2015-12-17 ENCOUNTER — Ambulatory Visit: Payer: Medicare Other

## 2015-12-17 DIAGNOSIS — Z510 Encounter for antineoplastic radiation therapy: Secondary | ICD-10-CM | POA: Diagnosis not present

## 2015-12-17 DIAGNOSIS — C01 Malignant neoplasm of base of tongue: Secondary | ICD-10-CM

## 2015-12-17 DIAGNOSIS — K5903 Drug induced constipation: Secondary | ICD-10-CM

## 2015-12-17 DIAGNOSIS — T402X5A Adverse effect of other opioids, initial encounter: Secondary | ICD-10-CM

## 2015-12-17 MED ORDER — SENNA 8.6 MG PO TABS: 2.0000 | ORAL_TABLET | Freq: Every day | ORAL | Status: DC

## 2015-12-17 NOTE — Telephone Encounter (Signed)
I received a call from Ms. Gallant's daughter, [name], telling me that her mom has been in terrible pain this morning due to constipation. The staff at [facility] were helping her with an enema at the time her daughter called me.  [name] is requesting that we change her treatment time to 3 pm or after today given the rough morning the patient has had.  [name] will be bringing her to treatment this afternoon. I let her know that the therapists would do their best to accommodate their requests and call them with a new treatment time for today.   I spoke to [place], RT on [place] #2 know that the patient wishes to reschedule her treatment for today.  Threasa Beards will be in touch with the patient's daughter regarding a new time.    I encouraged the patient's daughter to call me back with any other concerns.  I will prescribe Senokot for the patient to start taking on a scheduled basis to prevent further constipation, particularly in the setting of opiate use.  Senokot 2 tabs by mouth QHS, #120 prescription provided for her (even though this is available OTC).  There is notation on the prescription that she can titrate to every other day if she has loose stools.  We will continue to monitor.   Mike Craze, NP [place] [phone]

## 2015-12-17 NOTE — Progress Notes (Signed)
  Oncology Nurse Navigator Documentation  Navigator Location: [facility] (12/16/15 1550) Navigator Encounter Type: Treatment (12/16/15 1550)             Treatment Phase: First Radiation Tx (12/16/15 1550)     To provide support and encouragement, care continuity and to assess for needs, met with [name] during her first [place] 2 XRT.  She was accompanied by her dtr [name]. She managed tmt without difficulty. She reported:  Successfull pain mgt with recently prescribed tramadol-acetominophen.  Increased oral intake with improved pain control. She denied any needs at this time, understands I can be contacted as needed.  Jordan Orem, RN, BSN, [place] at [place] [phone]                            Time Spent with Patient: 45 (12/16/15 1550)

## 2015-12-17 NOTE — Progress Notes (Signed)
I briefly met with [name] today prior to her radiation treatment to follow-up on her instructions to begin taking Senokot to prevent further constipation.  Prescription given to pt's daughter, [name], but I made them both aware that this medication is OTC and does not require a prescription; I only gave a prescription so the directions would be available for the pt and if it is cheaper for her insurance to fill it rather than buy it OTC.  [name] and her daughter expressed verbal understanding of order to take Senokot.  We will continue to follow and add further bowel regimen medications, as needed.  I brought the patient around to L2 via wheelchair and left the patient  in the dressing room with her daughter to prepare for treatment.  L2 radiation therapists aware of patient's presence in dressing room.    Mike Craze, NP [place] [phone]

## 2015-12-18 ENCOUNTER — Ambulatory Visit
Admission: RE | Admit: 2015-12-18 | Discharge: 2015-12-18 | Disposition: A | Discharge status: Home / Self Care | Payer: Medicare Other | Source: Ambulatory Visit | Attending: Radiation Oncology | Admitting: Radiation Oncology

## 2015-12-18 ENCOUNTER — Ambulatory Visit: Payer: Medicare Other

## 2015-12-18 DIAGNOSIS — Z510 Encounter for antineoplastic radiation therapy: Secondary | ICD-10-CM | POA: Diagnosis not present

## 2015-12-21 ENCOUNTER — Ambulatory Visit
Admission: RE | Admit: 2015-12-21 | Discharge: 2015-12-21 | Disposition: A | Discharge status: Home / Self Care | Payer: Medicare Other | Source: Ambulatory Visit | Attending: Radiation Oncology | Admitting: Radiation Oncology

## 2015-12-21 VITALS — BP 145/55 | HR 75 | Ht 62.0 in | Wt 138.7 lb

## 2015-12-21 DIAGNOSIS — Z510 Encounter for antineoplastic radiation therapy: Secondary | ICD-10-CM | POA: Diagnosis not present

## 2015-12-21 DIAGNOSIS — C01 Malignant neoplasm of base of tongue: Secondary | ICD-10-CM

## 2015-12-21 NOTE — Progress Notes (Signed)
[name] is here for her 4th fraction of radiation to her Base of Tongue. She denies pain at this time, but reports she woke up at 5 am this morning with pain on the right side of her tongue. She took her ultram at that time, which does hlep She is unsure of when she should take her pain medicine, before she has pain or after she starts hurting. She reports she is not eating well. She has no appetite, and she is not able to taste food. She reports she has no desire to eat. She is eating fruit, and some other foods. She is eating ENU which has 450 calories per container. She does reports only eating small spoonfuls of this supplement. She does have ensure at [facility] but is not drinking it. I have encouraged her to drink these supplements. She reports a dry mouth which makes it difficult to swallow also. She is drinking 2-3 eight ounce water bottles daily. I have encouraged her to drink more to keep her hydrated.  The skin to her neck is normal in appearance, and she will begin using the sonafine cream today. She reports daily bowel movements, and is using 2 sennakot at night to help her have regular daily bowel movments.   BP 145/55 mmHg  Pulse 75  Ht 5\' 2"  (1.575 m)  Wt 138 lb 11.2 oz (62.914 kg)  BMI 25.36 kg/m2    Wt Readings from Last 3 Encounters:  12/21/15 138 lb 11.2 oz (62.914 kg)  12/14/15 140 lb 3.2 oz (63.594 kg)  12/09/15 141 lb 12.8 oz (64.32 kg)

## 2015-12-21 NOTE — Progress Notes (Signed)
Weekly Management Note:  Outpatient    ICD-9-CM ICD-10-CM   1. Malignant neoplasm of base of tongue (HCC) 141.0 C01     Current Dose:  9.2 Gy  Projected Dose: 46 Gy   Narrative:  The patient presents for routine under treatment assessment.  CBCT/MVCT images/Port film x-rays were reviewed.  The chart was checked.  Ms. Krider is here for her 4th fraction of radiation to her Base of Tongue. She denies pain at this time, but reports she woke up at 5 am this morning with pain on the right side of her tongue. She took her ultram at that time, which does help   She reports she is not eating well. She has no appetite, and she is not able to taste food. She reports she has no desire to eat. She is eating fruit, and some other foods. She is eating ENU which has 450 calories per container. She does reports only eating small spoonfuls of this supplement. She does have ensure at [facility] but is not drinking it.   She feels her tongue tumor is responding.  She is drinking 2-3 eight ounce water bottles daily.   I explained she needs 6 cups daily. Decaf coffee ok.  BP 145/55 mmHg  Pulse 75  Ht 5\' 2"  (1.575 m)  Wt 138 lb 11.2 oz (62.914 kg)  BMI 25.36 kg/m2    Physical Findings:  Wt Readings from Last 3 Encounters:  12/21/15 138 lb 11.2 oz (62.914 kg)  12/14/15 140 lb 3.2 oz (63.594 kg)  12/09/15 141 lb 12.8 oz (64.32 kg)    height is 5\' 2"  (1.575 m) and weight is 138 lb 11.2 oz (62.914 kg). Her blood pressure is 145/55 and her pulse is 75.  NAD, no mucositis or thrush. Tumor on tongue visible  CBC    Component Value Date/Time   WBC 9.9 10/23/2015 0940   WBC 8.1 06/09/2015   RBC 4.09 10/23/2015 0940   HGB 12.8 10/23/2015 0940   HCT 40.7 10/23/2015 0940   PLT 390 10/23/2015 0940   MCV 99.5 10/23/2015 0940   MCH 31.3 10/23/2015 0940   MCHC 31.4 10/23/2015 0940   RDW 14.6 10/23/2015 0940   MONOABS 0.6 01/22/2008 1356   EOSABS 0.5 01/22/2008 1356   BASOSABS 0.1 01/22/2008 1356       CMP     Component Value Date/Time   NA 141 10/23/2015 0940   NA 140 06/09/2015   K 4.3 10/23/2015 0940   CL 105 10/23/2015 0940   CO2 27 10/23/2015 0940   GLUCOSE 94 10/23/2015 0940   BUN 21.5 12/09/2015 1027   BUN 17 10/23/2015 0940   BUN 34* 06/09/2015   CREATININE 1.0 12/09/2015 1027   CREATININE 0.93 10/23/2015 0940   CREATININE 1.1 06/09/2015   CALCIUM 9.5 10/23/2015 0940   AST 16 06/09/2015   ALT 14 06/09/2015   ALKPHOS 54 06/09/2015   GFRNONAA 53* 10/23/2015 0940   GFRAA >60 10/23/2015 0940     Impression:  The patient is tolerating radiotherapy.   Plan:  Continue radiotherapy as planned. Urged patient to eat and drink more.  Explained alternatives of PEG tube and or IV fluids.  Explained that hydration and nutrition will minimize risk of hospitalization.  Suggested trying Breeze drinks by [employer].  Asked her to let us know if she feels lightheaded or unable to increase intake. Sees [employer] on 3-1.  Take pain meds PRN.  Use lidocaine (swish and spit or  swallow PRN depending on where she is sore)  -----------------------------------  [name], MD

## 2015-12-22 ENCOUNTER — Telehealth: Payer: Self-pay | Admitting: Nutrition

## 2015-12-22 ENCOUNTER — Encounter: Payer: Self-pay | Admitting: Adult Health

## 2015-12-22 ENCOUNTER — Ambulatory Visit
Admission: RE | Admit: 2015-12-22 | Discharge: 2015-12-22 | Disposition: A | Discharge status: Home / Self Care | Payer: Medicare Other | Source: Ambulatory Visit | Attending: Radiation Oncology | Admitting: Radiation Oncology

## 2015-12-22 ENCOUNTER — Encounter: Payer: Self-pay | Admitting: *Deleted

## 2015-12-22 DIAGNOSIS — Z510 Encounter for antineoplastic radiation therapy: Secondary | ICD-10-CM | POA: Diagnosis not present

## 2015-12-22 NOTE — Telephone Encounter (Signed)
Contacted patient by telephone to offer more convenient time for nutrition follow-up on Wednesday, March 1.  Patient was unable to come in early or stay after radiation treatment tomorrow. States she did not know about appointment. She would like to put off nutrition follow-up until next week. Patient is scheduled for nutrition follow-up on Tuesday, March 7. Patient reports she does not feel good today.  She is having early morning nausea. It is very difficult for her to force herself to eat. She has not enjoy oral nutrition supplements. Weight has decreased was documented as 138 pounds February 27, down from 142.5 pounds February 14.  Nutrition diagnosis: Unintended weight loss continues.  Food and nutrition related knowledge deficit continues.  Intervention:  Educated patient on the importance of consuming high-calorie high-protein foods to minimize further weight loss. Encouraged patient to try a variety of different oral nutrition supplements. Spoke about the importance of patient considering feeding tube to help her obtain optimal nutrition throughout treatment. Patient reports she will consider. Patient agreeable to coming in next Tuesday for nutrition follow-up.  Monitoring, evaluation, goals: Patient will tolerate increased oral intake and consider feeding tube placement.  Next visit: Tuesday, March 7, after radiation treatment.  **Disclaimer: This note was dictated with voice recognition software. Similar sounding words can inadvertently be transcribed and this note may contain transcription errors which may not have been corrected upon publication of note.**

## 2015-12-22 NOTE — Progress Notes (Signed)
I received a call from the patient's daughter, [name], after the patient completed radiation treatment today with questions regarding potential feeding tube placement.  The patient tells me that Dory Peru, RD is recommending that the patient consider feeding tube placement in the setting of continued weight loss.  I met with the patient and her daughter.   I briefly discussed the role and purpose of a feeding in tube in the setting of weight loss in patients with H&N cancer. I reviewed what could potentially be expected during the procedure to have a feeding tube placed and potential side effects. Currently, she does not want a feeding tube, but she will think about it in the coming days.  Gayleen Orem, RN-H&N Navigator also spent some time teaching and showing the patient his G-tube teaching device.    I will see the patient for evaluation on Thursday, 12/24/15 after her radiation treatment, at the request of Dr. Isidore Moos.  We will revisit the idea of having a feeding tube placed and will also check orthostatic VS and assess the need for IV fluids.  Ms. Deihl is aware of this evaluation and knows to call me with any questions before that time.  I have updated the [facility]rap[facility] that I will need to see Ms. Feeley after treatment on Thursday.   Mike Craze, NP [place] [phone]

## 2015-12-23 ENCOUNTER — Ambulatory Visit
Admission: RE | Admit: 2015-12-23 | Discharge: 2015-12-23 | Disposition: A | Discharge status: Home / Self Care | Payer: Medicare Other | Source: Ambulatory Visit | Attending: Radiation Oncology | Admitting: Radiation Oncology

## 2015-12-23 ENCOUNTER — Encounter: Payer: Self-pay | Admitting: Nutrition

## 2015-12-23 DIAGNOSIS — Z510 Encounter for antineoplastic radiation therapy: Secondary | ICD-10-CM | POA: Diagnosis not present

## 2015-12-24 ENCOUNTER — Ambulatory Visit
Admission: RE | Admit: 2015-12-24 | Discharge: 2015-12-24 | Disposition: A | Discharge status: Home / Self Care | Payer: Medicare Other | Source: Ambulatory Visit | Attending: Radiation Oncology | Admitting: Radiation Oncology

## 2015-12-24 ENCOUNTER — Ambulatory Visit (HOSPITAL_BASED_OUTPATIENT_CLINIC_OR_DEPARTMENT_OTHER): Payer: Self-pay | Admitting: Adult Health

## 2015-12-24 ENCOUNTER — Encounter: Payer: Self-pay | Admitting: Adult Health

## 2015-12-24 VITALS — BP 154/54 | HR 86 | Resp 16 | Wt 138.8 lb

## 2015-12-24 DIAGNOSIS — C01 Malignant neoplasm of base of tongue: Secondary | ICD-10-CM

## 2015-12-24 DIAGNOSIS — E46 Unspecified protein-calorie malnutrition: Secondary | ICD-10-CM

## 2015-12-24 DIAGNOSIS — Z510 Encounter for antineoplastic radiation therapy: Secondary | ICD-10-CM | POA: Diagnosis not present

## 2015-12-24 NOTE — Progress Notes (Signed)
REASON FOR VISIT:  Routine follow-up for head & neck cancer   BRIEF ONCOLOGIC HISTORY:    Malignant neoplasm of base of tongue ([place])   10/12/2015 Imaging CT neck: Base of tongue mass measuring approximately 14 mm. There may be additional tumor extending to the left of this mass.  Malignant adenopathy throughout right neck. Image quality degraded by moderate motion.    10/30/2015 Initial Biopsy Right base of tongue biopsy.  Path revealed mucoepidermoid carcinoma.     10/30/2015 Initial Diagnosis Malignant neoplasm of base of tongue ([place])   11/11/2015 Procedure Flexible fiberoptic laryngoscopy: Large submucosal mass noted at the base of tongue and vallecula on the right, with extension back to the vallecula with a more exophytic component directly in midline, which stopped short of the sulcus of the vallecula wi   11/13/2015 PET scan Irregular hypermetabolic mass in right oropharynx, measuring 3.9 x 3.3 cm, (considerably larger than reported on 10/12/15 CT neck). Hypermetabolic right level 2 and right level 3 LN mets. No distant hypermetabolic metastatic disease.     INTERVAL HISTORY:  Ms. Ewan has completed 7/20 fractions of scheduled radiation therapy treatments today.  I was asked to see the patient today by Dr. Isidore Moos to follow-up on her nutrition/fluid intake status and assess any concerns she may be having.    Ms. Hause tells me that her pain has been very well-controlled with the Tramadol/APAP.  She continues to be able to consume fluids, but is having a hard time with food as she has dysgeusia and no appetite.  She is concerned about her continued weight loss.  She tells me that when she tries to drink Boost Breeze or eat soft solid foods, she becomes nauseas.      ADDITIONAL REVIEW OF SYSTEMS:  Review of Systems  Constitutional: Positive for weight loss. Negative for fever.  HENT:       Right tongue/mouth pain that radiates up to right ear  Gastrointestinal: Positive for  nausea. Negative for blood in stool.       -"Woke up this morning with a little nausea, but I did not vomit."  -Chronic issues with constipation   Genitourinary: Negative for dysuria and hematuria.       -chronic urinary incontinence; reports having to get up many times at night to urinate   Neurological: Positive for weakness.     CURRENT MEDICATIONS:  Current Outpatient Prescriptions on File Prior to Visit  Medication Sig Dispense Refill  . cholecalciferol (VITAMIN D) 1000 UNITS tablet Take 2,000 Units by mouth daily.    Marland Kitchen levothyroxine (SYNTHROID, LEVOTHROID) 75 MCG tablet TAKE 1 TABLET BY MOUTH EVERY MORNING 90 tablet 1  . lidocaine (XYLOCAINE) 2 % solution Patient: Mix 1part 2% viscous lidocaine, 1part H20. Swish and/or swallow 63mL of this mixture, 58min before meals and at bedtime, up to QID 100 mL 5  . predniSONE (DELTASONE) 5 MG tablet Take 2.5 mg by mouth daily with breakfast.   0  . senna (SENOKOT) 8.6 MG TABS tablet Take 2 tablets (17.2 mg total) by mouth at bedtime. May take every other day if stools become loose. 120 each 0  . sodium fluoride (FLUORISHIELD) 1.1 % GEL dental gel Instill one drop of gel per tooth space of fluoride tray. Place over teeth for 5 minutes. Remove. Spit out excess. Repeat nightly. 120 mL 11  . traMADol-acetaminophen (ULTRACET) 37.5-325 MG tablet Take 1 tablet by mouth every 4 (four) hours as needed. 60 tablet 0   No current  facility-administered medications on file prior to visit.    ALLERGIES:  Allergies  Allergen Reactions  . Sulfa Antibiotics   . Ampicillin Swelling  . Chlorine   . Codeine   . Macrobid [Nitrofurantoin Monohydrate Macrocrystals]   . Statins   . Trimethoprim      PHYSICAL EXAM:  Filed Vitals:   12/24/15 1345  BP: 154/54  Pulse: 86  Resp: 16    Weight Date  138 lb 12.8 oz (62.959 kg) 12/24/15  138 lb 11.2 oz (62.914 kg) 12/21/15  140 lb 3.2 oz (63.594 kg) 12/14/15  142 lb 8 oz (64.638 kg) 12/08/15  143 lb 3.2 oz  (64.955 kg) Pre-treatment: 12/04/15   General: Elderly female in no acute distress.  Accompanied in clinic today by her friend, Angelita Ingles. HEENT: Head is atraumatic and normocephalic.  Pupils equal and reactive to light. Conjunctivae clear without exudate.  Sclerae anicteric. Oral mucosa is pink and slightly dry.  Tongue pink; visible posterior right tongue mass. Oropharynx is pink without lesions. No evidence of mucositis or thrush. Cardiovascular: Normal rate and rhythm. Respiratory: Clear to auscultation bilaterally.  GI: Abdomen soft and round. Non-tender.  Bowel sounds normoactive.  GU: Deferred.   Neuro: No focal deficits. Steady gait.  Psych: Normal mood and affect for situation. Extremities: No edema, cyanosis, or clubbing.   Skin: Warm and dry.    LABORATORY DATA:  None at this visit.  DIAGNOSTIC IMAGING:  None at this visit.    ASSESSMENT & PLAN:  Ms. Broadwell is a very pleasant 82 y.o. 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  She is currently undergoing palliative radiation to her head & neck; she completed fraction #7/20 today.   1. Cancer of the right base of tongue: Ms. Marc has elected not to pursue treatment with surgery or chemotherapy after consultations at both [facility] and [facility].  She will continue with her scheduled palliative radiation therapy, under the care of Dr. Isidore Moos.   2. Decreased oral intake/Protein-calorie malnutrition: While Ms. Wrinkle's weight is stable today from her last visit 3 days ago, the trend of her weight loss has continued.  Given that our approach to her cancer treatments is palliative with emphasis on local cancer control and symptom management, she has elected to undergo PEG tube placement.  The discussion for a feeding tube has been multi-disciplinary since she began treatment and has included her nurse navigator, her dietitian, Dr. Isidore Moos, and myself.  She understands the importance of  adequate nutrition and fluid intake when undergoing treatments to the head and neck.  When patients are unable to maintain their weight and are at high risk for decompensation, an intervention with gastrostomy tube placement is appropriate.  We discussed the procedure of having the tube placed and what to expect.  I made no guarantees on the timeframe she could expect to need the feeding tube, as this is variable from patient to patient and the time it takes each individual to recover from cancer treatments.  I reinforced that often having a PEG placed is a temporary measure to ensure she has the strength and calories necessary to complete her prescribed radiation treatments.  Once she has completed treatment and has demonstrated that she can maintain adequate oral intake by mouth and her weight has stabilized, then the tube can be removed.  She was reassured that this will likely not be a permanent intervention for her. Encouraged her to continue to eat and drink as she  is able, both before and after the feeding tube is placed.   3. Neoplasm-related pain: Currently well-controlled with Tramadol/APAP.  She reports that she currently does not need a refill on this medication.  She is using it appropriately and her pain is managed.   4. Constipation prevention/Adequate bowel regimen:  She reports that she has been using the Senna (prescribed at a previous encounter), which has helped maintain the goal of having a bowel movement every day or every other day, particularly while taking opiates.  I again reinforced the importance of titrating these bowel regimen medications to ensure she does not have constipation.     I encouraged Ms. Kerman to ask questions regarding her above plan of care. All questions were answered to her stated satisfaction.    Dispo:  -Of note: Code Status-DNR -Orders placed today for IR Gastrostomy tube placement at [place], RD aware of orders placed today and will  make recommendations/orders for the patient's tube feedings and water flushes.   -Dr. Isidore Moos is aware of the patient's wishes to proceed with PEG placement. I will have Ms. Labuda see Dr. Isidore Moos for a PUT visit to answer any potential last minute questions regarding the above plan of care for this patient.  -I can see Ms. Lynam as needed to continue to support her during and after her radiation therapy is complete.    A total of 30 minutes was spent with this patient in face-to-face care, with greater than 50% of that time in counseling and care-coordination.    Mike Craze, NP [place] [phone]     (Coding/Billing notation: This patient is a no charge per Dr. Pablo Ledger, Medical Director of Survivorship; pt has never been seen by medical oncology)

## 2015-12-24 NOTE — Progress Notes (Signed)
  Oncology Nurse Navigator Documentation  Navigator Location: [facility]-Med Onc (12/22/15 1420) Navigator Encounter Type: Education (12/22/15 1420)           Patient Visit Type: [id number] (12/22/15 1420) Treatment Phase: Active Tx (12/22/15 1420) Barriers/Navigation Needs: Education (12/22/15 1420) Education: Coping with Diagnosis/ Prognosis (12/22/15 1420) Interventions: Education Method (12/22/15 1420)     Education Method: Teach-back;Demonstration;Verbal (12/22/15 1420)      Acuity: Level 3 (12/22/15 1420)     Acuity Level 3: Nutritional support;Emotional needs;Ongoing guidance and education provided throughout treatment (12/22/15 1420)      In response to patient dtr [name]'s VMM, I joined her and her mother with Mike Craze, NP, in their discussion of PEG placement.  Following Gretchen's time with them, I further reinforced benefits of PEG placement per that discussion.  Additionally,  Using teach back, I provided further education using my PEG teaching device.    Ms. Fohl successfully provided return demonstration of gravity bolus instillation of water.  She had some difficulty manipulating the clamping device but after repeated attempts became more adept.    I assured her that with additional practice she will be able to manage the device successfully.  I assured her that she will receive additional support by her [facility] Team.  At the conclusion of my instruction and support, Ms. Hollern expressed the desire to move forward with PEG placement indicating that the education and instruction provided by myself and Elzie Rings was very helpful in her coming to this decision.  I provided verbal notification to Dr. Isidore Moos and Elzie Rings.  Ms. Nipple understands she can call Elzie Rings or me with additional questions or concerns.  Gayleen Orem, RN, BSN, [place] [facility] [place] [phone]      Time Spent with  Patient: 60 (12/22/15 1420)

## 2015-12-24 NOTE — Patient Instructions (Signed)
-  You will be contacted with your appointments to have your feeding tube placed. -You may be asked to pick up barium from the radiology department prior to your feeding tube placement. This is at the discretion of the radiology department at [facility].  -I will have [place] follow-up with you if you should keep your appt with her next week or just wait until after your feeding tube is placed. -Your radiation treatment times may change a little next week depending on the time and day of your feeding tube placement.  -Maintain your bowel regimen medications and titrate to have a bowel movement to every day or every other day.   Call me with any questions!  Mike Craze, NP [phone]

## 2015-12-25 ENCOUNTER — Ambulatory Visit
Admission: RE | Admit: 2015-12-25 | Discharge: 2015-12-25 | Disposition: A | Discharge status: Home / Self Care | Payer: Medicare Other | Source: Ambulatory Visit | Attending: Radiation Oncology | Admitting: Radiation Oncology

## 2015-12-25 ENCOUNTER — Encounter: Payer: Self-pay | Admitting: Radiation Oncology

## 2015-12-25 VITALS — BP 171/52 | HR 81 | Wt 136.9 lb

## 2015-12-25 DIAGNOSIS — C01 Malignant neoplasm of base of tongue: Secondary | ICD-10-CM

## 2015-12-25 DIAGNOSIS — Z510 Encounter for antineoplastic radiation therapy: Secondary | ICD-10-CM | POA: Diagnosis not present

## 2015-12-25 NOTE — Progress Notes (Signed)
[name] is here to see Dr. Isidore Moos to discuss her Feeding Tube placement and any questions she might have.   BP 171/52 mmHg  Pulse 81  Wt 136 lb 14.4 oz (62.097 kg)

## 2015-12-25 NOTE — Progress Notes (Signed)
   Weekly Management Note:  Outpatient    ICD-9-CM ICD-10-CM   1. Malignant neoplasm of base of tongue (HCC) 141.0 C01      Current Dose:  18.4 Gy  Projected Dose: 46 Gy   Narrative: [name] is here to see Dr. Isidore Moos to discuss her Feeding Tube placement and any questions she might have. She has some morning nausea, but this isn't constant.  She struggles to take in food and nutritional supplements and reports poor nutritional intake. Her main issue is poor taste in her mouth which started after radiotherapy. She missed her appointment with nutrition but will see Ernestene Kiel on March 7th. Yesterday, an order for a PEG tube is to be placed, following her visit with Mike Craze. She is using Lidocaine and baking soda/salt to alleviate symptoms.   States she was able to eat a hard-boiled egg today, ate only ~800 calories yesterday.  BP 171/52 mmHg  Pulse 81  Wt 136 lb 14.4 oz (62.097 kg)    Physical Findings:  Wt Readings from Last 3 Encounters:  12/25/15 136 lb 14.4 oz (62.097 kg)  12/24/15 138 lb 12.8 oz (62.959 kg)  12/21/15 138 lb 11.2 oz (62.914 kg)    weight is 136 lb 14.4 oz (62.097 kg). Her blood pressure is 171/52 and her pulse is 81.   - Tumor on tongue visible in posterior right tongue but no obvious mucositis or thrush. Skin intact, without irritation  CBC    Component Value Date/Time   WBC 9.9 10/23/2015 0940   WBC 8.1 06/09/2015   RBC 4.09 10/23/2015 0940   HGB 12.8 10/23/2015 0940   HCT 40.7 10/23/2015 0940   PLT 390 10/23/2015 0940   MCV 99.5 10/23/2015 0940   MCH 31.3 10/23/2015 0940   MCHC 31.4 10/23/2015 0940   RDW 14.6 10/23/2015 0940   MONOABS 0.6 01/22/2008 1356   EOSABS 0.5 01/22/2008 1356   BASOSABS 0.1 01/22/2008 1356     CMP     Component Value Date/Time   NA 141 10/23/2015 0940   NA 140 06/09/2015   K 4.3 10/23/2015 0940   CL 105 10/23/2015 0940   CO2 27 10/23/2015 0940   GLUCOSE 94 10/23/2015 0940   BUN 21.5 12/09/2015  1027   BUN 17 10/23/2015 0940   BUN 34* 06/09/2015   CREATININE 1.0 12/09/2015 1027   CREATININE 0.93 10/23/2015 0940   CREATININE 1.1 06/09/2015   CALCIUM 9.5 10/23/2015 0940   AST 16 06/09/2015   ALT 14 06/09/2015   ALKPHOS 54 06/09/2015   GFRNONAA 53* 10/23/2015 0940   GFRAA >60 10/23/2015 0940     Impression:  The patient is tolerating radiotherapy, however she has poor nutritional intake and will need a PEG tube.   Plan: A PEG tube order has been placed and will soon be scheduled for placement. I will see her again on March 6th.   -----------------------------------  [name], MD  This document serves as a record of services personally performed by [name], MD. It was created on her behalf by Derek Mound, a trained medical scribe. The creation of this record is based on the scribe's personal observations and the provider's statements to them. This document has been checked and approved by the attending provider.

## 2015-12-28 ENCOUNTER — Ambulatory Visit: Payer: Medicare Other

## 2015-12-28 ENCOUNTER — Other Ambulatory Visit (HOSPITAL_COMMUNITY): Payer: Self-pay

## 2015-12-28 ENCOUNTER — Encounter: Payer: Self-pay | Admitting: Radiation Oncology

## 2015-12-28 ENCOUNTER — Ambulatory Visit (HOSPITAL_COMMUNITY): Payer: Self-pay

## 2015-12-28 ENCOUNTER — Ambulatory Visit: Payer: Self-pay

## 2015-12-28 ENCOUNTER — Ambulatory Visit
Admission: RE | Admit: 2015-12-28 | Discharge: 2015-12-28 | Disposition: A | Discharge status: Home / Self Care | Payer: Medicare Other | Source: Ambulatory Visit | Attending: Radiation Oncology | Admitting: Radiation Oncology

## 2015-12-28 VITALS — BP 146/69 | HR 83 | Ht 62.0 in | Wt 133.6 lb

## 2015-12-28 DIAGNOSIS — Z510 Encounter for antineoplastic radiation therapy: Secondary | ICD-10-CM | POA: Diagnosis not present

## 2015-12-28 DIAGNOSIS — C01 Malignant neoplasm of base of tongue: Secondary | ICD-10-CM

## 2015-12-28 MED ORDER — TRAMADOL HCL 50 MG PO TABS: 50.0000 mg | ORAL_TABLET | ORAL | Status: DC | PRN

## 2015-12-28 MED FILL — traMADol HCL 50 MG TABS: 50 | 15 days supply | Qty: 90 | Fill #0

## 2015-12-28 NOTE — Patient Instructions (Signed)
Take Tramadol 50 mg every 4 hours as needed for pain.  If you need more pain medication between the Tramadol, take Tylenol as needed.  Do not exceed 4000 mg daily of Tylenol.

## 2015-12-28 NOTE — Progress Notes (Signed)
[name] is here for her 9th fraction of radiation to her Tongue. She complains of pain when swallowing and on the right side of her tongue. Her mouth is sore and tender on the right side of her mouth and tongue. She is taking one ultram every 4 hours to relieve her pain. She is not eating or drinking much at all. She has her tube feeding placement this Wednesday for nutrition. She is using the sonafine cream to her neck and face as directed. She does have a rash like appearance to her Right neck.   BP 146/69 mmHg  Pulse 83  Ht 5\' 2"  (1.575 m)  Wt 133 lb 9.6 oz (60.601 kg)  BMI 24.43 kg/m2  SpO2 100%   Wt Readings from Last 3 Encounters:  12/28/15 133 lb 9.6 oz (60.601 kg)  12/25/15 136 lb 14.4 oz (62.097 kg)  12/24/15 138 lb 12.8 oz (62.959 kg)

## 2015-12-28 NOTE — Progress Notes (Addendum)
   Weekly Management Note:  Outpatient    ICD-9-CM ICD-10-CM   1. Malignant neoplasm of base of tongue (HCC) 141.0 C01 traMADol (ULTRAM) 50 MG tablet     Current Dose:  20.7 Gy  Projected Dose: 46 Gy   Narrative: [name] is here w/ her 2  daughters. They have many good questions.  [name] is here for her 9th fraction of radiation to her Tongue. She complains of pain when swallowing and on the right side of her tongue. Her mouth is sore and tender on the right side of her mouth and tongue. She is taking one ultram every 4 hours to relieve her pain. She is not eating or drinking much at all. She has her tube feeding placement this Wednesday for nutrition. She is using the sonafine cream to her neck and face as directed.      Physical Findings:  Wt Readings from Last 3 Encounters:  12/28/15 133 lb 9.6 oz (60.601 kg)  12/25/15 136 lb 14.4 oz (62.097 kg)  12/24/15 138 lb 12.8 oz (62.959 kg)    height is 5\' 2"  (1.575 m) and weight is 133 lb 9.6 oz (60.601 kg). Her blood pressure is 146/69 and her pulse is 83. Her oxygen saturation is 100%.   - Tumor on tongue visible in posterior right tongue with lateral ulceration over right post tongue.  Tumor appears smaller.  Skin intact   CBC    Component Value Date/Time   WBC 9.9 10/23/2015 0940   WBC 8.1 06/09/2015   RBC 4.09 10/23/2015 0940   HGB 12.8 10/23/2015 0940   HCT 40.7 10/23/2015 0940   PLT 390 10/23/2015 0940   MCV 99.5 10/23/2015 0940   MCH 31.3 10/23/2015 0940   MCHC 31.4 10/23/2015 0940   RDW 14.6 10/23/2015 0940   MONOABS 0.6 01/22/2008 1356   EOSABS 0.5 01/22/2008 1356   BASOSABS 0.1 01/22/2008 1356     CMP     Component Value Date/Time   NA 141 10/23/2015 0940   NA 140 06/09/2015   K 4.3 10/23/2015 0940   CL 105 10/23/2015 0940   CO2 27 10/23/2015 0940   GLUCOSE 94 10/23/2015 0940   BUN 21.5 12/09/2015 1027   BUN 17 10/23/2015 0940   BUN 34* 06/09/2015   CREATININE 1.0 12/09/2015 1027   CREATININE 0.93 10/23/2015 0940   CREATININE 1.1 06/09/2015   CALCIUM 9.5 10/23/2015 0940   AST 16 06/09/2015   ALT 14 06/09/2015   ALKPHOS 54 06/09/2015   GFRNONAA 53* 10/23/2015 0940   GFRAA >60 10/23/2015 0940     Impression:  The patient is tolerating radiotherapy, however she has poor nutritional intake and will need a PEG tube - to be placed in 48 hours.   Plan:  Take Tramadol 50 mg every 4 hours as needed for pain.  If you need more pain medication between the Tramadol, take Tylenol as needed.  Do not exceed 4000 mg daily of Tylenol.  Use lidocaine PRN as well.  Referring to [name] for palliative care consult.  Discussed option of adding a week of RT for more aggressive treatment, as patient has concerns about tumor progression. But, more dose would increase risk of side effects. She'll let me know in a week what she wants to do.  She cancelled SLP visit. Urged to schedule this again.  Risk of dysphagia.  -----------------------------------  [name], MD

## 2015-12-29 ENCOUNTER — Ambulatory Visit: Payer: Medicare Other | Attending: Radiation Oncology

## 2015-12-29 ENCOUNTER — Other Ambulatory Visit: Payer: Self-pay | Admitting: Radiology

## 2015-12-29 ENCOUNTER — Ambulatory Visit: Payer: Medicare Other | Admitting: Nutrition

## 2015-12-29 ENCOUNTER — Other Ambulatory Visit: Payer: Self-pay | Admitting: General Surgery

## 2015-12-29 ENCOUNTER — Ambulatory Visit
Admission: RE | Admit: 2015-12-29 | Discharge: 2015-12-29 | Disposition: A | Discharge status: Home / Self Care | Payer: Medicare Other | Source: Ambulatory Visit | Attending: Radiation Oncology | Admitting: Radiation Oncology

## 2015-12-29 DIAGNOSIS — R1310 Dysphagia, unspecified: Secondary | ICD-10-CM | POA: Insufficient documentation

## 2015-12-29 DIAGNOSIS — Z510 Encounter for antineoplastic radiation therapy: Secondary | ICD-10-CM | POA: Diagnosis not present

## 2015-12-29 NOTE — Patient Instructions (Signed)
Add chin tuck against resistance: Put a pool noodle or 3-4" rolled towel under your chin and press down hard in order to hold the item there for 60 seconds. Do 3 times total, 2 times a day.  Do the exercises, it will keep you from having muscle hardening. The more you do , the less chance of hardening you have. Eat and/or drink even when you have the feeding tube so that the swallowing muscles don't shrink.  Have a pain pill and then eat/drink, and do the exercises.

## 2015-12-29 NOTE — Therapy (Signed)
[facility] [address] [place], [place], [address] Phone: [phone]   Fax:  [phone]  Speech Language Pathology Treatment  Patient Details  Name: [name] MRN: [id number] Date of Birth: 1924/09/13 Referring Provider: [name], M.D.  Encounter Date: 12/29/2015      End of Session - 12/29/15 1630    Visit Number 2   Number of Visits 3   Date for SLP Re-Evaluation 02/08/16   SLP Start Time 65   SLP Stop Time  67   SLP Time Calculation (min) 38 min   Activity Tolerance Patient limited by pain      Past Medical History  Diagnosis Date  . Nasal polyps   . Osteoporosis   . Mitral valve disorders   . Chronic rhinitis   . Unspecified urinary incontinence   . Asthma   . Hyperlipidemia   . Hypertension   . Urge incontinence   . Unspecified hypothyroidism   . Bilateral claudication of lower limb ([place]) 08/29/2013    Bilateral posterior thighs  . Aortic atherosclerosis ([place]) 08/29/2013    Identified on Aortic ultrasound exam 2008   . Bullous pemphigoid 02/2015    Dr. Wilhemina Bonito  . Arthritis     Past Surgical History  Procedure Laterality Date  . Nasal polyp surgery  03/2002    x2 Dr Wilburn Cornelia  . Tonsillectomy    . Total abdominal hysterectomy w/ bilateral salpingoophorectomy  1970's  . Bunionectomy Left 12/1998  . Milk cyst right breast    . Breast lumpectomy Right 1953  . Bunionectomy Right 02/2000  . Eye surgery Bilateral 02/2000    Cataract removal  . Root canal  02/2014  . Direct laryngoscopy N/A 10/30/2015    Procedure: DIRECT LARYNGOSCOPY AND BIOPSY OF TONGUE MASS;  Surgeon: Jerrell Belfast, MD;  Location: [facility];  Service: ENT;  Laterality: N/A;    There were no vitals filed for this visit.  Visit Diagnosis: Dysphagia      Subjective Assessment - 12/29/15 1558    Subjective Pt arrives with [name], daughter "I can't swallow." Pt tells SLP that she can't swallow due to pain, and cannot  swallow when pain meds are working.                ADULT SLP TREATMENT - 12/29/15 1528    General Information   Behavior/Cognition Alert;Cooperative;Pleasant mood   Treatment Provided   Treatment provided Dysphagia   Dysphagia Treatment   Temperature Spikes Noted No   Treatment Methods Therapeutic exercise;Skilled observation;Patient/caregiver education   Patient observed directly with PO's Yes   Type of PO's observed Thin liquids   Liquids provided via Cup   Oral Phase Signs & Symptoms --  none   Pharyngeal Phase Signs & Symptoms --  none   Other treatment/comments Pt/daughter agree pt does not cough/clear throat drinking liquids/soups. Pt complains of ageusia and xerostomia. Pt DID NOT show overt signs of aspiration during trials with H2O today x6. She was unable to complete [place] x3 attempts and repeated after each one, "I can't swallow with my tongue out." SLP told pt to touch posterior of upper front teeth and this wa successful, and told pt to cont to try and this may become easier as rad progresses. SLP educated pt/daughter on every exercise except pitch raise, which pt stated she was doing. Shaker, due to room logistics, was only explained and not demonstrated. SLP added chin tuck against resistance (CTAR) exercise to pt's regimen. SLP also  re-educated pt re: muscle fibrosis and muscle atrophy and encouraged POs and completion of total HEP. Pt with PEG tube placement tomorrow.    Pain Assessment   Pain Assessment 0-10   Pain Score 5    Pain Location tongue   Pain Descriptors / Indicators Sharp   Pain Intervention(s) Monitored during session;Limited activity within patient's tolerance  pain meds wearing off   Assessment / Recommendations / Plan   Plan Continue with current plan of care   Dysphagia Recommendations   Diet recommendations --  as tolerated   Progression Toward Goals   Progression toward goals Not progressing toward goals (comment)          SLP  Education - 12/29/15 1629    Education provided Yes   Education Details HEP (CTAR added), late effects rad on swallowing   Person(s) Educated Patient;Child(ren)   Methods Explanation;Demonstration;Handout;Verbal cues   Comprehension Verbalized understanding;Returned demonstration;Verbal cues required;Need further instruction          SLP Short Term Goals - 12/29/15 1632    SLP SHORT TERM GOAL #1   Title pt will demo HEP with rare min A   Time 1   Period --  visits   Status On-going   SLP SHORT TERM GOAL #2   Title pt will tell SLP why she is completing HEP   Time 1   Period --  visits   Status On-going          SLP Long Term Goals - 12/29/15 1632    SLP LONG TERM GOAL #1   Title pt will tell SLP 2 s/s aspiration PNa   Time 2   Period --  vistis   Status On-going   SLP LONG TERM GOAL #2   Title pt will perfrom HEP indpendently   Time 2   Period --  visits   Status On-going   SLP LONG TERM GOAL #3   Title pt will tell SLP why a food journal may benefit return to regular PO diet   Time 2   Period --  vistis   Status On-going          Plan - 12/29/15 1631    Clinical Impression Statement Pt with swalowing which appears WNL at this time but pt reports is limited by level of pain when swallowing. Pt would cont to benefit from skilled ST to assess procedure with HEP and assess safety with POs during and after rad tx.   Speech Therapy Frequency --  once every 4 weeks, approx.   Duration --  60 days   Treatment/Interventions Aspiration precaution training;Diet toleration management by SLP;Internal/external aids;Compensatory strategies;Pharyngeal strengthening exercises;Oral motor exercises;SLP instruction and feedback;Patient/family education   Potential to Achieve Goals Good   Potential Considerations Pain level   SLP Home Exercise Plan provided today   Consulted and Agree with Plan of Care Patient        Problem List Patient Active Problem List    Diagnosis Date Noted  . Malignant neoplasm of base of tongue ([place]) 12/07/2015  . Bullous pemphigoid 07/29/2015  . Aortic atherosclerosis ([facility]) 08/29/2013  . Bilateral claudication of lower limb ([facility]) 08/29/2013  . Hypothyroidism   . Right hip pain 01/16/2013  . Asthma   . Hyperlipidemia   . Hypertension   . Urge incontinence   . NASAL POLYP 05/24/2010  . RHINITIS 08/04/2007  . Allergic asthma 08/04/2007    [place] ,[place], CCC-SLP  12/29/2015, 4:32 PM  [facility]  [address] [place], [place], [address] Phone: [phone]   Fax:  [phone]   Name: Reylynn Campopiano MRN: [id number] Date of Birth: 1924/04/05

## 2015-12-29 NOTE — Progress Notes (Signed)
Nutrition follow-up completed with patient and daughter. Patient is scheduled for feeding tube placement on Wednesday in IR.Marland Kitchen She is very anxious about procedure and has some questions on drinking barium.  Patient is concerned she will not be able to swallow the barium. She is consuming approximately 1 boost plus a day and one clear liquid protein drink for approximately 600 cal, at the most. Weight has decreased and was documented as 133.6 pounds March 6, down from 138.7 pounds February 27. She is status post 10 fractions of radiation therapy.  Nutrition diagnosis: Unintended weight loss and nutrition related knowledge deficit both continue.  Estimated nutrition needs: 1750-2000 calories, 85-95 grams protein, 2 L fluid.  Intervention: Patient educated on strategies for increasing oral intake recommending 4 ounces of oral nutrition supplement every 3 hours. Provided samples of "compact" nutrition. Provided purchasing information and samples. Reviewed details regarding enteral feeding through feeding tube. Contacted interventional radiology for clarification about barium.  Per [name], Dr. advised patient not to drink barium at all.  He stated inpatients are not drinking barium and they would evaluate patient when she gets to IR for tube placement.  Concern is for oral barium to be left in patient's stomach and then procedure being unable to be performed. Patient was notified and repeated instructions back to RD while I was on the phone with [name].  Recommend 120 cc Osmolite 1.5 - 4 times a day with 60 cc free water before and after each bolus feeding.   If tolerated, patient to increase slowly to goal of 240 cc of Osmolite 1.5 five times a day with 60 cc free water before and after.  Patient will need an additional 480 water by mouth or through tube as tolerated. Patient is at risk for refeeding syndrome.  Monitoring, evaluation, goals: Patient will tolerate oral intake plus enteral nutrition  through feeding tube to meet minimum estimated nutrition needs.  Next visit: Thursday, March 9 after radiation therapy.  **Disclaimer: This note was dictated with voice recognition software. Similar sounding words can inadvertently be transcribed and this note may contain transcription errors which may not have been corrected upon publication of note.**

## 2015-12-30 ENCOUNTER — Ambulatory Visit
Admission: RE | Admit: 2015-12-30 | Discharge: 2015-12-30 | Disposition: A | Discharge status: Home / Self Care | Payer: Medicare Other | Source: Ambulatory Visit | Attending: Radiation Oncology | Admitting: Radiation Oncology

## 2015-12-30 ENCOUNTER — Ambulatory Visit (HOSPITAL_COMMUNITY)
Admission: RE | Admit: 2015-12-30 | Discharge: 2015-12-30 | Disposition: A | Discharge status: Home / Self Care | Payer: Medicare Other | Source: Ambulatory Visit | Attending: Radiation Oncology | Admitting: Radiation Oncology

## 2015-12-30 ENCOUNTER — Encounter (HOSPITAL_COMMUNITY): Payer: Self-pay

## 2015-12-30 ENCOUNTER — Telehealth: Payer: Self-pay | Admitting: *Deleted

## 2015-12-30 ENCOUNTER — Ambulatory Visit (HOSPITAL_COMMUNITY)
Admission: RE | Admit: 2015-12-30 | Discharge: 2015-12-30 | Disposition: A | Discharge status: Home / Self Care | Payer: Medicare Other | Source: Ambulatory Visit | Attending: Adult Health | Admitting: Adult Health

## 2015-12-30 ENCOUNTER — Ambulatory Visit: Payer: PRIVATE HEALTH INSURANCE | Admitting: Internal Medicine

## 2015-12-30 ENCOUNTER — Encounter: Payer: Self-pay | Admitting: Nutrition

## 2015-12-30 ENCOUNTER — Other Ambulatory Visit: Payer: Self-pay | Admitting: *Deleted

## 2015-12-30 ENCOUNTER — Encounter: Payer: Self-pay | Admitting: *Deleted

## 2015-12-30 DIAGNOSIS — Z882 Allergy status to sulfonamides status: Secondary | ICD-10-CM | POA: Diagnosis not present

## 2015-12-30 DIAGNOSIS — C01 Malignant neoplasm of base of tongue: Secondary | ICD-10-CM | POA: Insufficient documentation

## 2015-12-30 DIAGNOSIS — Z803 Family history of malignant neoplasm of breast: Secondary | ICD-10-CM | POA: Insufficient documentation

## 2015-12-30 DIAGNOSIS — I10 Essential (primary) hypertension: Secondary | ICD-10-CM | POA: Insufficient documentation

## 2015-12-30 DIAGNOSIS — Z888 Allergy status to other drugs, medicaments and biological substances status: Secondary | ICD-10-CM | POA: Diagnosis not present

## 2015-12-30 DIAGNOSIS — Z885 Allergy status to narcotic agent status: Secondary | ICD-10-CM | POA: Insufficient documentation

## 2015-12-30 DIAGNOSIS — Z833 Family history of diabetes mellitus: Secondary | ICD-10-CM | POA: Insufficient documentation

## 2015-12-30 DIAGNOSIS — Z8249 Family history of ischemic heart disease and other diseases of the circulatory system: Secondary | ICD-10-CM | POA: Insufficient documentation

## 2015-12-30 DIAGNOSIS — Z880 Allergy status to penicillin: Secondary | ICD-10-CM | POA: Diagnosis not present

## 2015-12-30 DIAGNOSIS — E785 Hyperlipidemia, unspecified: Secondary | ICD-10-CM | POA: Diagnosis not present

## 2015-12-30 DIAGNOSIS — E46 Unspecified protein-calorie malnutrition: Secondary | ICD-10-CM

## 2015-12-30 DIAGNOSIS — E039 Hypothyroidism, unspecified: Secondary | ICD-10-CM | POA: Diagnosis not present

## 2015-12-30 DIAGNOSIS — Z881 Allergy status to other antibiotic agents status: Secondary | ICD-10-CM | POA: Diagnosis not present

## 2015-12-30 DIAGNOSIS — M1990 Unspecified osteoarthritis, unspecified site: Secondary | ICD-10-CM | POA: Diagnosis not present

## 2015-12-30 DIAGNOSIS — Z510 Encounter for antineoplastic radiation therapy: Secondary | ICD-10-CM | POA: Diagnosis not present

## 2015-12-30 DIAGNOSIS — Z79899 Other long term (current) drug therapy: Secondary | ICD-10-CM | POA: Insufficient documentation

## 2015-12-30 DIAGNOSIS — R1310 Dysphagia, unspecified: Secondary | ICD-10-CM | POA: Insufficient documentation

## 2015-12-30 LAB — BASIC METABOLIC PANEL
ANION GAP: 8 (ref 5–15)
BUN: 20 mg/dL (ref 6–20)
CO2: 26 mmol/L (ref 22–32)
Calcium: 9.3 mg/dL (ref 8.9–10.3)
Chloride: 105 mmol/L (ref 101–111)
Creatinine, Ser: 0.91 mg/dL (ref 0.44–1.00)
GFR calc Af Amer: >60 mL/min (ref >60)
GFR, EST NON AFRICAN AMERICAN: 54 mL/min — AB (ref >60)
GLUCOSE: 90 mg/dL (ref 65–99)
POTASSIUM: 4.1 mmol/L (ref 3.5–5.1)
Sodium: 139 mmol/L (ref 135–145)

## 2015-12-30 LAB — CBC WITH DIFFERENTIAL/PLATELET
BASOS ABS: 0 10*3/uL (ref 0.0–0.1)
BASOS PCT: 0 %
Eosinophils Absolute: 0 10*3/uL (ref 0.0–0.7)
Eosinophils Relative: 0 %
HCT: 42.2 % (ref 36.0–46.0)
Hemoglobin: 13.4 g/dL (ref 12.0–15.0)
LYMPHS PCT: 10 %
Lymphs Abs: 0.9 10*3/uL (ref 0.7–4.0)
MCH: 31.5 pg (ref 26.0–34.0)
MCHC: 31.8 g/dL (ref 30.0–36.0)
MCV: 99.3 fL (ref 78.0–100.0)
MONO ABS: 0.8 10*3/uL (ref 0.1–1.0)
Monocytes Relative: 8 %
NEUTROS ABS: 7.4 10*3/uL (ref 1.7–7.7)
Neutrophils Relative %: 82 %
PLATELETS: 382 10*3/uL (ref 150–400)
RBC: 4.25 MIL/uL (ref 3.87–5.11)
RDW: 13.8 % (ref 11.5–15.5)
WBC: 9.1 10*3/uL (ref 4.0–10.5)

## 2015-12-30 LAB — PROTIME-INR
INR: 1.13 (ref 0.00–1.49)
PROTHROMBIN TIME: 14.2 s (ref 11.6–15.2)

## 2015-12-30 MED ORDER — MIDAZOLAM HCL 2 MG/2ML IJ SOLN
INTRAMUSCULAR | Status: AC
  Filled 2015-12-30: qty 4

## 2015-12-30 MED ORDER — GLUCAGON HCL RDNA (DIAGNOSTIC) 1 MG IJ SOLR
INTRAMUSCULAR | Status: AC | PRN
  Administered 2015-12-30: 1 mg via INTRAVENOUS

## 2015-12-30 MED ORDER — SODIUM CHLORIDE 0.9 % IV SOLN
INTRAVENOUS | Status: DC
  Administered 2015-12-30: 12:00:00 via INTRAVENOUS

## 2015-12-30 MED ORDER — OSMOLITE 1.5 CAL PO LIQD: ORAL | Status: DC

## 2015-12-30 MED ORDER — VANCOMYCIN HCL IN DEXTROSE 1-5 GM/200ML-% IV SOLN
1000.0000 mg | INTRAVENOUS | Status: AC
  Administered 2015-12-30: 1000 mg via INTRAVENOUS
  Filled 2015-12-30: qty 200

## 2015-12-30 MED ORDER — LIDOCAINE-EPINEPHRINE 2 %-1:100000 IJ SOLN
INTRAMUSCULAR | Status: AC | PRN
  Administered 2015-12-30: 10 mL

## 2015-12-30 MED ORDER — IOHEXOL 300 MG/ML  SOLN
10.0000 mL | Freq: Once | INTRAMUSCULAR | Status: AC | PRN
  Administered 2015-12-30: 10 mL

## 2015-12-30 MED ORDER — GLUCAGON HCL RDNA (DIAGNOSTIC) 1 MG IJ SOLR
INTRAMUSCULAR | Status: AC
  Filled 2015-12-30: qty 1

## 2015-12-30 MED ORDER — TRAMADOL HCL 50 MG PO TABS
50.0000 mg | ORAL_TABLET | ORAL | Status: AC
  Administered 2015-12-30: 50 mg via ORAL
  Filled 2015-12-30: qty 1

## 2015-12-30 MED ORDER — MIDAZOLAM HCL 2 MG/2ML IJ SOLN
INTRAMUSCULAR | Status: AC | PRN
  Administered 2015-12-30 (×4): 0.5 mg via INTRAVENOUS

## 2015-12-30 MED ORDER — FENTANYL CITRATE (PF) 100 MCG/2ML IJ SOLN
INTRAMUSCULAR | Status: AC | PRN
  Administered 2015-12-30 (×2): 25 ug via INTRAVENOUS
  Administered 2015-12-30: 50 ug via INTRAVENOUS

## 2015-12-30 MED ORDER — LIDOCAINE-EPINEPHRINE 2 %-1:100000 IJ SOLN
INTRAMUSCULAR | Status: AC
  Filled 2015-12-30: qty 1

## 2015-12-30 MED ORDER — FENTANYL CITRATE (PF) 100 MCG/2ML IJ SOLN
INTRAMUSCULAR | Status: DC
Start: 2015-12-30 — End: 2015-12-31
  Filled 2015-12-30: qty 2

## 2015-12-30 NOTE — Sedation Documentation (Signed)
Patient denies pain and is resting comfortably.  

## 2015-12-30 NOTE — Progress Notes (Signed)
  Oncology Nurse Navigator Documentation  Navigator Location: [facility] (12/30/15 1155) Navigator Encounter Type: Other (12/30/15 1155)   Abnormal Finding Date: 10/12/15 (12/30/15 1155) Confirmed Diagnosis Date: 10/30/15 (12/30/15 1155)   Treatment Initiated Date: 12/17/15 (12/30/15 1155)     Barriers/Navigation Needs: Education (12/30/15 1155) Education: Preparing for Upcoming Surgery/ Treatment (12/30/15 1155)       Education Method: Demonstration;Verbal (12/30/15 1155)    Specialty Items/DME: PEG care supplies (12/30/15 1155)       Met with Ms. Gangwer  In [place] prior to her PEG placement.  Dtrs Beth and [name] were at the bedside. I conducted a review of PEG education I previously provided, answered her and Beth's questions. I provided her PEG care supplies sufficient for first couple of days following placement and until additional supplies provided by [facility]. I provided encouragement and emotional support, assured her that her Care Team at [facility] will provide ongoing support.  Gayleen Orem, RN, BSN, [facility] [phone]        Time Spent with Patient: 30 (12/30/15 1155)

## 2015-12-30 NOTE — Telephone Encounter (Signed)
  Oncology Nurse Navigator Documentation  Navigator Location: [facility] (12/30/15 1135) Navigator Encounter Type: Telephone (12/30/15 1135) Telephone: [place] Call (12/30/15 1135)           Treatment Phase: Active Tx (12/30/15 1135) Barriers/Navigation Needs: Coordination of Care (12/30/15 1135)   Interventions: Referrals (12/30/15 1135)       Following my placement of referral for ambulatory [facility] nursing, called [facility] spoke with Corene Cornea to make aware of referral and patient's PEG placement later this morning/eqarly afternoon.   Per my conversation with Dory [place], RD, she has notified [place] with [facility], placed order for nutritional supplement and PEG supplies.  Gayleen Orem, RN, BSN, [facility] [place] [phone]                   Time Spent with Patient: 15 (12/30/15 1135)

## 2015-12-30 NOTE — Procedures (Signed)
Successful fluoroscopic guided insertion of gastrostomy tube.   The gastrostomy tube may be used immediately for medications.   Tube feeds may be initiated in 24 hours as per the primary team.   EBL: Minimal No immediate post procedural complications.   Jay Demara Lover, MD Pager #: [phone]    

## 2015-12-30 NOTE — H&P (Signed)
Chief Complaint: Patient was seen in consultation today for percutaneous gastrostomy tube placement  Referring Physician(s): Dawson,Gretchen W  Supervising Physician: Sandi Mariscal  History of Present Illness: [name] is a 82 y.o. 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 Patient has had decreased oral intake, dysphagia and protein calorie malnutrition and presents today for percutaneous gastrostomy tube placement to assist with nutritional needs.   Past Medical History  Diagnosis Date  . Nasal polyps   . Osteoporosis   . Mitral valve disorders   . Chronic rhinitis   . Unspecified urinary incontinence   . Asthma   . Hyperlipidemia   . Hypertension   . Urge incontinence   . Unspecified hypothyroidism   . Bilateral claudication of lower limb ([facility]) 08/29/2013    Bilateral posterior thighs  . Aortic atherosclerosis ([place]) 08/29/2013    Identified on Aortic ultrasound exam 2008   . Bullous pemphigoid 02/2015    Dr. Wilhemina Bonito  . Arthritis     Past Surgical History  Procedure Laterality Date  . Nasal polyp surgery  03/2002    x2 Dr Wilburn Cornelia  . Tonsillectomy    . Total abdominal hysterectomy w/ bilateral salpingoophorectomy  1970's  . Bunionectomy Left 12/1998  . Milk cyst right breast    . Breast lumpectomy Right 1953  . Bunionectomy Right 02/2000  . Eye surgery Bilateral 02/2000    Cataract removal  . Root canal  02/2014  . Direct laryngoscopy N/A 10/30/2015    Procedure: DIRECT LARYNGOSCOPY AND BIOPSY OF TONGUE MASS;  Surgeon: Jerrell Belfast, MD;  Location: [facility];  Service: ENT;  Laterality: N/A;    Allergies: Sulfa antibiotics; Ampicillin; Chlorine; Codeine; Macrobid; Statins; and Trimethoprim  Medications: Prior to Admission medications   Medication Sig Start Date End Date Taking? Authorizing Provider  levothyroxine (SYNTHROID, LEVOTHROID) 75 MCG tablet TAKE 1 TABLET  BY MOUTH EVERY MORNING 08/07/15  Yes [name] L Reed, DO  lidocaine (XYLOCAINE) 2 % solution Patient: Mix 1part 2% viscous lidocaine, 1part H20. Swish and/or swallow 60mL of this mixture, 22min before meals and at bedtime, up to QID 12/04/15  Yes [name], MD  predniSONE (DELTASONE) 5 MG tablet Take 2.5 mg by mouth daily with breakfast.  04/09/15  Yes Historical Provider, MD  senna (SENOKOT) 8.6 MG TABS tablet Take 2 tablets (17.2 mg total) by mouth at bedtime. May take every other day if stools become loose. 12/17/15  Yes Holley Bouche, NP  sodium fluoride (FLUORISHIELD) 1.1 % GEL dental gel Instill one drop of gel per tooth space of fluoride tray. Place over teeth for 5 minutes. Remove. Spit out excess. Repeat nightly. 12/08/15  Yes Lenn Cal, DDS  traMADol (ULTRAM) 50 MG tablet Take 1 tablet (50 mg total) by mouth every 4 (four) hours as needed. 12/28/15  Yes [name], MD  Ascorbic Acid (VITAMIN C) 1000 MG tablet Take 1,000 mg by mouth daily.    Historical Provider, MD  cholecalciferol (VITAMIN D) 1000 UNITS tablet Take 2,000 Units by mouth daily.    Historical Provider, MD  Nutritional Supplements (FEEDING SUPPLEMENT, OSMOLITE 1.5 CAL,) LIQD Begin 120 cc Osmolite 1.5 QID with 60 cc free water before and after each feeding. Day 3, increase to 180 cc Osmolite 1.5 as tolerated. Day 5 increase to 240 cc Osmolite 1.5 as tolerated. Day 7, increase to 240 cc Osmolite 1.5 5 times daily. Flush or drink an additional 240 cc  free water daily. Please send feedings and supplies. 12/30/15   [name], MD  traMADol-acetaminophen (ULTRACET) 37.5-325 MG tablet Take 1 tablet by mouth every 4 (four) hours as needed. 12/14/15   Holley Bouche, NP     Family History  Problem Relation Age of Onset  . Diabetes Mother     Adult onset  . Arthritis Mother   . Breast cancer Mother 81  . Heart attack Father   . Diabetes Father     Adult onset  . Heart disease Father   . Heart Problems Father      Arrhythmia requiring pacemaker  . Diabetes type II      sibling  . Allergies      1/2 sister(father's side)  . Diabetes Brother   . Fibromyalgia Daughter     Social History   Social History  . Marital Status: Widowed    Spouse Name: N/A  . Number of Children: 3  . Years of Education: N/A   Occupational History  . retired Pharmacist, hospital    Social History Main Topics  . Smoking status: Never Smoker   . Smokeless tobacco: Never Used     Comment: Positive hx of passive tobacco smoke exposure  . Alcohol Use: 1.2 oz/week    2 Glasses of wine per week     Comment: 2 wine a week -social  . Drug Use: No  . Sexual Activity: No   Other Topics Concern  . None   Social History Narrative   Widowed, married in [place], spouse died 10/25/1998.    Patient lives in [place] [facility] [place] since 2007.    Patient has a living well.    She is a retired Pharmacist, hospital.    Never smoked   Alcohol: wine twice a week    Exercises 3 times a week      Review of Systems currently denies fever, headache, chest pain, dyspnea, cough, abdominal/back pain, vomiting or abnormal bleeding. She has had weight loss, dysphagia, occasional nausea.  Vital Signs: BP 155/71 mmHg  Pulse 87  Temp(Src) 98.4 F (36.9 C) (Oral)  Resp 16  Ht 5\' 2"  (1.575 m)  Wt 133 lb 9.6 oz (60.601 kg)  BMI 24.43 kg/m2  SpO2 97%  Physical Exam patient awake, alert. Mouth dry, posterior right tongue mass ; Chest clear to auscultation bilaterally. Heart with regular rate and rhythm. Abdomen soft, positive bowel sounds, nontender. Lower extremities with no edema  Mallampati Score:     Imaging: No results found.  Labs:  CBC:  Recent Labs  06/09/15 10/23/15 0940 12/30/15 1150  WBC 8.1 9.9 9.1  HGB 13.4 12.8 13.4  HCT 40 40.7 42.2  PLT 378 390 382    COAGS:  Recent Labs  12/30/15 1150  INR 1.13    BMP:  Recent Labs  06/09/15 10/23/15 0940 12/09/15 1027 12/30/15 1150  NA 140 141  --  139    K 3.7 4.3  --  4.1  CL  --  105  --  105  CO2  --  27  --  26  GLUCOSE  --  94  --  90  BUN 34* 17 21.5 20  CALCIUM  --  9.5  --  9.3  CREATININE 1.1 0.93 1.0 0.91  GFRNONAA  --  53*  --  54*  GFRAA  --  >60  --  >60    LIVER FUNCTION TESTS:  Recent Labs  06/09/15  AST 16  ALT  14  ALKPHOS 54    TUMOR MARKERS: No results for input(s): AFPTM, CEA, CA199, CHROMGRNA in the last 8760 hours.  Assessment and Plan: 82 y.o. 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 Patient has had decreased oral intake, dysphagia and protein calorie malnutrition and presents today for percutaneous gastrostomy tube placement to assist with nutritional needs.Risks and benefits discussed with the patient/daughters including, but not limited to the need for a barium enema during the procedure, bleeding, infection, peritonitis, or damage to adjacent structures.All of the patient's questions were answered, patient is agreeable to proceed.Consent signed and in chart.     Thank you for this interesting consult.  I greatly enjoyed meeting [name] and look forward to participating in their care.  A copy of this report was sent to the requesting provider on this date.  Electronically Signed: D. Rowe Robert 12/30/2015, 12:46 PM   I spent a total of  15 minutes in face to face in clinical consultation, greater than 50% of which was counseling/coordinating care for percutaneous gastrostomy tube placement

## 2015-12-30 NOTE — Discharge Instructions (Signed)
Care of a Feeding Tube Feeding tubes are often given to those who have trouble swallowing or cannot take food or medicine. A feeding tube can:   Go into the nose and down to the stomach.  Go through the skin in the belly (abdomen) and into the stomach or small bowel. SUPPLIES NEEDED TO CARE FOR THE TUBE SITE  Clean gloves.  Clean wash cloth, gauze pads, or soft paper towel.  Cotton swabs.  Skin barrier ointment or cream.  Soap and water.  Precut foam pads or gauze (that go around the tube).  Tube tape. TUBE SITE CARE 1. Have all supplies ready. 2. Wash hands well. 3.  Put on clean gloves. 4. Remove dirty foam pads or gauze near the tube site, if present. 5. Check the skin around the tube site for redness, rash, puffiness (swelling), leaking fluid, or extra tissue growth. Call your doctor if you see any of these. 6. Wet the gauze and cotton swabs with water and soap. 7. Wipe the area closest to the tube with cotton swabs. Wipe the surrounding skin with moistened gauze. Rinse with water. 8. Dry the skin and tube site with a dry gauze pad or soft paper towel. Do not use antibiotic ointments at the tube site. 9. If the skin is red, apply petroleum jelly in a circular motion, using a cotton swab. Your doctor may suggest a different cream or ointment. Use what the doctor suggests. 10. Apply a new pre-cut foam pad or gauze around the tube. Tape the edges down. Foam pads or gauze may be left off if there is no fluid at the tube site. 11. Use tape or a device that will attach your feeding tube to your skin or do as directed. Rotate where you tape the tube. 12. Sit the person up. 13. Throw away used supplies. 14. Remove gloves. 15. Wash hands. SUPPLIES NEEDED TO FLUSH A FEEDING TUBE  Clean gloves.  60 mL syringe (that connects to feeding tube).  Towel.  Water. FLUSHING A FEEDING TUBE  1. Have all supplies ready. 2. Wash hands well. 3. Put on clean gloves. 4. Pull 30 mL of  water into the syringe. 5. Bend (kink) the feeding tube while disconnecting it from the feeding-bag tubing or while removing the plug at the end of the tube. 6. Insert the tip of the syringe into the end of the feeding tube. Stop bending the tube. Slowly inject the water. 7. If you cannot inject the water, the person with the feeding tube should lay on their left side. 8. After injecting the water, remove the syringe. 9. Always flush the tube before giving the first medicine, between medicines, and after the final medicine before starting a feeding. 10. Throw away used supplies. 11. Remove gloves. 12. Wash hands.   This information is not intended to replace advice given to you by your health care provider. Make sure you discuss any questions you have with your health care provider.   Document Released: 07/04/2012 Document Reviewed: 07/04/2012 Elsevier Interactive Patient Education 2016 [employer]. Gastrostomy Tube Home Guide, Adult A gastrostomy tube is a tube that is surgically placed into the stomach. It is also called a "G-tube." G-tubes are used when a person is unable to eat and drink enough on their own to stay healthy. The tube is inserted into the stomach through a small cut (incision) in the skin. This tube is used for:  Feeding.  Giving medication. GASTROSTOMY TUBE CARE  Wash your hands  with soap and water.  Remove the old dressing (if any). Some styles of G-tubes may need a dressing inserted between the skin and the G-tube. Other types of G-tubes do not require a dressing. Ask your health care provider if a dressing is needed.  Check the area where the tube enters the skin (insertion site) for redness, swelling, or pus-like (purulent) drainage. A small amount of clear or tan liquid drainage is normal. Check to make sure scar tissue (skin) is not growing around the insertion site. This could have a raised, bumpy appearance.  A cotton swab can be used to clean the skin around  the tube:  When the G-tube is first put in, a normal saline solution or water can be used to clean the skin.  Mild soap and warm water can be used when the skin around the G-tube site has healed.  Roll the cotton swab around the G-tube insertion site to remove any drainage or crusting at the insertion site. STOMACH RESIDUALS Feeding tube residuals are the amount of liquids that are in the stomach at any given time. Residuals may be checked before giving feedings, medications, or as instructed by your health care provider. 18. Ask your health care provider if there are instances when you would not start tube feedings depending on the amount or type of contents withdrawn from the stomach. 17. Check residuals by attaching a syringe to the G-tube and pulling back on the syringe plunger. Note the amount, and return the residual back into the stomach. FLUSHING THE G-TUBE  The G-tube should be periodically flushed with clean warm water to keep it from clogging.  Flush the G-tube after feedings or medications. Draw up 30 mL of warm water in a syringe. Connect the syringe to the G-tube and slowly push the water into the tube.  Do not push feedings, medications, or flushes rapidly. Flush the G-tube gently and slowly.  Only use syringes made for G-tubes to flush medications or feedings.  Your health care provider may want the G-tube flushed more often or with more water. If this is the case, follow your health care provider's instructions. FEEDINGS Your health care provider will determine whether feedings are given as a bolus (a certain amount given at one time and at scheduled times) or whether feedings will be given continuously on a feeding pump.  13. Formulas should be given at room temperature. 14. If feedings are continuous, no more than 4 hours worth of feedings should be placed in the feeding bag. This helps prevent spoilage or accidental excess infusion. 15. Cover and place unused formula in  the refrigerator. 16. If feedings are continuous, stop the feedings when medications or flushes are given. Be sure to restart the feedings. 17. Feeding bags and syringes should be replaced as instructed by your health care provider. GIVING MEDICATION   In general, it is best if all medications are in a liquid form for G-tube administration. Liquid medications are less likely to clog the G-tube.  Mix the liquid medication with 30 mL (or amount recommended by your health care provider) of warm water.  Draw up the medication into the syringe.  Attach the syringe to the G-tube and slowly push the mixture into the G-tube.  After giving the medication, draw up 30 mL of warm water in the syringe and slowly flush the G-tube.  For pills or capsules, check with your health care provider first before crushing medications. Some pills are not effective if they are crushed.  Some capsules are sustained-release medications.  If appropriate, crush the pill or capsule and mix with 30 mL of warm water. Using the syringe, slowly push the medication through the tube, then flush the tube with another 30 mL of tap water. G-TUBE PROBLEMS G-tube was pulled out.  Cause: May have been pulled out accidentally.  Solutions: Cover the opening with clean dressing and tape. Call your health care provider right away. The G-tube should be put in as soon as possible (within 4 hours) so the G-tube opening (tract) does not close. The G-tube needs to be put in at a health care setting. An X-ray needs to be done to confirm placement before the G-tube can be used again. Redness, irritation, soreness, or foul odor around the gastrostomy site.  Cause: May be caused by leakage or infection.  Solutions: Call your health care provider right away. Large amount of leakage of fluid or mucus-like liquid present (a large amount means it soaks clothing).  Cause: Many reasons could cause the G-tube to leak.  Solutions: Call your health  care provider to discuss the amount of leakage. Skin or scar tissue appears to be growing where tube enters skin.   Cause: Tissue growth may develop around the insertion site if the G-tube is moved or pulled on excessively.  Solutions: Secure tube with tape so that excess movement does not occur. Call your health care provider. G-tube is clogged.  Cause: Thick formula or medication.  Solutions: Try to slowly push warm water into the tube with a large syringe. Never try to push any object into the tube to unclog it. Do not force fluid into the G-tube. If you are unable to unclog the tube, call your health care provider right away. TIPS  Head of bed (HOB) position refers to the upright position of a person's upper body.  When giving medications or a feeding bolus, keep the [facility] up as told by your health care provider. Do this during the feeding and for 1 hour after the feeding or medication administration.  If continuous feedings are being given, it is best to keep the [facility] up as told by your health care provider. When ADLs (activities of daily living) are performed and the [facility] needs to be flat, be sure to turn the feeding pump off. Restart the feeding pump when the [facility] is returned to the recommended height.  Do not pull or put tension on the tube.  To prevent fluid backflow, kink the G-tube before removing the cap or disconnecting a syringe.  Check the G-tube length every day. Measure from the insertion site to the end of the G-tube. If the length is longer than previous measurements, the tube may be coming out. Call your health care provider if you notice increasing G-tube length.  Oral care, such as brushing teeth, must be continued.  You may need to remove excess air (vent) from the G-tube. Your health care provider will tell you if this is needed.  Always call your health care provider if you have questions or problems with the G-tube. SEEK IMMEDIATE MEDICAL CARE IF:   You have  severe abdominal pain, tenderness, or abdominal bloating (distension).  You have nausea or vomiting.  You are constipated or have problems moving your bowels.  The G-tube insertion site is red, swollen, has a foul smell, or has yellow or brown drainage.  You have difficulty breathing or shortness of breath.  You have a fever.  You have a large amount of feeding  tube residuals.  The G-tube is clogged and cannot be flushed. MAKE SURE YOU:   Understand these instructions.  Will watch your condition.  Will get help right away if you are not doing well or get worse.   This information is not intended to replace advice given to you by your health care provider. Make sure you discuss any questions you have with your health care provider.   Document Released: 12/19/2001 Document Revised: 02/24/2015 Document Reviewed: 06/17/2013 Elsevier Interactive Patient Education 2016 Elsevier Inc. Moderate Conscious Sedation, Adult, Care After Refer to this sheet in the next few weeks. These instructions provide you with information on caring for yourself after your procedure. Your health care provider may also give you more specific instructions. Your treatment has been planned according to current medical practices, but problems sometimes occur. Call your health care provider if you have any problems or questions after your procedure. WHAT TO EXPECT AFTER THE PROCEDURE  After your procedure:  You may feel sleepy, clumsy, and have poor balance for several hours.  Vomiting may occur if you eat too soon after the procedure. HOME CARE INSTRUCTIONS  Do not participate in any activities where you could become injured for at least 24 hours. Do not:  Drive.  Swim.  Ride a bicycle.  Operate heavy machinery.  Cook.  Use power tools.  Climb ladders.  Work from a high place.  Do not make important decisions or sign legal documents until you are improved.  If you vomit, drink water, juice,  or soup when you can drink without vomiting. Make sure you have little or no nausea before eating solid foods.  Only take over-the-counter or prescription medicines for pain, discomfort, or fever as directed by your health care provider.  Make sure you and your family fully understand everything about the medicines given to you, including what side effects may occur.  You should not drink alcohol, take sleeping pills, or take medicines that cause drowsiness for at least 24 hours.  If you smoke, do not smoke without supervision.  If you are feeling better, you may resume normal activities 24 hours after you were sedated.  Keep all appointments with your health care provider. SEEK MEDICAL CARE IF: 18. Your skin is pale or bluish in color. 19. You continue to feel nauseous or vomit. 20. Your pain is getting worse and is not helped by medicine. 21. You have bleeding or swelling. 22. You are still sleepy or feeling clumsy after 24 hours. SEEK IMMEDIATE MEDICAL CARE IF:  You develop a rash.  You have difficulty breathing.  You develop any type of allergic problem.  You have a fever. MAKE SURE YOU: 18. Understand these instructions. 19. Will watch your condition. 20. Will get help right away if you are not doing well or get worse.   This information is not intended to replace advice given to you by your health care provider. Make sure you discuss any questions you have with your health care provider.   Document Released: 07/31/2013 Document Revised: 10/31/2014 Document Reviewed: 07/31/2013 Elsevier Interactive Patient Education [employer].

## 2015-12-30 NOTE — Progress Notes (Signed)
Orders written for patient's gastric tube feeding to begin on Thursday, March 9. Patient will begin with 120 cc of Osmolite 1.5 - 4 times a day with 60 cc free water before and after each bolus feeding. Day 3.  Patient will increase to 180 cc Osmolite 1.5 - 4 times a day as tolerated. Day 5.  Patient will increase to 240 cc Osmolite 1.5 - 4 times a day as tolerated. Day 7.  Patient will increase Osmolite 1.5-240 cc 5 times a day as tolerated. In addition patient will consume an additional 480 cc of water by mouth or through tube as tolerated.   Patient is at risk for refeeding syndrome. Recommend CMET, magnesium, phosphorus on Monday and Thursday next week. Will notify advanced homecare and RN/M.D. to place orders for lab work.

## 2015-12-31 ENCOUNTER — Other Ambulatory Visit (HOSPITAL_COMMUNITY): Payer: Self-pay

## 2015-12-31 ENCOUNTER — Ambulatory Visit: Payer: Medicare Other | Admitting: Nutrition

## 2015-12-31 ENCOUNTER — Telehealth: Payer: Self-pay | Admitting: *Deleted

## 2015-12-31 ENCOUNTER — Ambulatory Visit
Admission: RE | Admit: 2015-12-31 | Discharge: 2015-12-31 | Disposition: A | Discharge status: Home / Self Care | Payer: Medicare Other | Source: Ambulatory Visit | Attending: Radiation Oncology | Admitting: Radiation Oncology

## 2015-12-31 ENCOUNTER — Other Ambulatory Visit (HOSPITAL_BASED_OUTPATIENT_CLINIC_OR_DEPARTMENT_OTHER): Payer: Medicare Other

## 2015-12-31 ENCOUNTER — Encounter: Payer: Self-pay | Admitting: *Deleted

## 2015-12-31 ENCOUNTER — Other Ambulatory Visit: Payer: Self-pay | Admitting: Adult Health

## 2015-12-31 DIAGNOSIS — C01 Malignant neoplasm of base of tongue: Secondary | ICD-10-CM

## 2015-12-31 DIAGNOSIS — Z789 Other specified health status: Secondary | ICD-10-CM

## 2015-12-31 DIAGNOSIS — Z931 Gastrostomy status: Secondary | ICD-10-CM

## 2015-12-31 DIAGNOSIS — Z510 Encounter for antineoplastic radiation therapy: Secondary | ICD-10-CM | POA: Diagnosis not present

## 2015-12-31 LAB — COMPREHENSIVE METABOLIC PANEL
ALBUMIN: 3.7 g/dL (ref 3.5–5.0)
ALK PHOS: 66 U/L (ref 40–150)
ALT: 10 U/L (ref 0–55)
AST: 16 U/L (ref 5–34)
Anion Gap: 12 mEq/L — ABNORMAL HIGH (ref 3–11)
BILIRUBIN TOTAL: 0.86 mg/dL (ref 0.20–1.20)
BUN: 30.1 mg/dL — AB (ref 7.0–26.0)
CO2: 25 mEq/L (ref 22–29)
CREATININE: 1.3 mg/dL — AB (ref 0.6–1.1)
Calcium: 10 mg/dL (ref 8.4–10.4)
Chloride: 99 mEq/L (ref 98–109)
EGFR: 37 mL/min/{1.73_m2} — AB (ref >90)
GLUCOSE: 146 mg/dL — AB (ref 70–140)
Potassium: 4.6 mEq/L (ref 3.5–5.1)
SODIUM: 135 meq/L — AB (ref 136–145)
TOTAL PROTEIN: 7.4 g/dL (ref 6.4–8.3)

## 2015-12-31 LAB — MAGNESIUM: Magnesium: 1.8 mg/dl (ref 1.5–2.5)

## 2015-12-31 NOTE — Telephone Encounter (Addendum)
  Oncology Nurse Navigator Documentation  Navigator Location: [facility] (12/31/15 1002)   Telephone: Incoming Call;Symptom Mgt (12/31/15 1002)           Treatment Phase: Active Tx (12/31/15 1002)   Education: Pain/ Symptom Management (12/31/15 1002)       Received call from Ms. Mehrer's dtr [employer]. She reported:  Her mother had a "very tough time" last HS r/t post-PEG placement pain.     She was able to resolve pain after taking Ultracet and 2 tablets Tylenol, was then able to sleep through the night until morning.    She is comfortable this morning having taken only Ultracet at 9:00.  Her mother has stated she is coming in for her 1:00 XRT. Beth further expressed concern her mother doesn't understand the importance of proactive pain mgt, "she needs something stronger".  I indicated I would confer with Mike Craze, NP, re suggestions, arrange for Elzie Rings to see her if possible.  I spoke with Ms. Trice, she confirmed information relayed by her dtr. I educated her on the importance of taking medication as available to control pain, encouraged her to take additional Tylenol this morning and then Ultracet again before leaving home to come for tmt.  She verbalized understanding.  I had also shared this recommendation with dtr Beth.  I will follow-up with Ms. Dominic when she arrives for tmt today.  Gayleen Orem, RN, BSN, [place] at [place] [phone]                     Time Spent with Patient: 30 (12/31/15 1002)

## 2015-12-31 NOTE — Progress Notes (Signed)
  Oncology Nurse Navigator Documentation  Navigator Location: [facility] (12/31/15 1310) Navigator Encounter Type: Treatment (12/31/15 1310)           Patient Visit Type: [id number] (12/31/15 1310)   Barriers/Navigation Needs: Coordination of Care (12/31/15 1310)   Interventions: Coordination of Care (12/31/15 1310)     Met with Ms. Mencer following her XRT.  She was accompanied by her dtrs Beth and [name].  I discussed with her [place] guidance to take Oxy IR as prescribed for improved pain control s/p yesterday's PEG placement.  She voiced understanding, reluctantly indicated she would comply.  Dtr [name] indicated the [facility] nursing director, Adline Peals, visited this morning to discuss interim care in the [facility] unit during her recovery from PEG placement and to insure improved safety r/t to her increasing weakness.  Ms. Otterson expressed agreement.  Per [name], I called RN Donalda Ewings [phone]) to determine orders needed.  She indicated:  1.  Admit to [facility] d/t weakness following PEG placement and radiation fatigue.  2.  PT evaluation and treatment as needed.  3.  Continue current medications.  (She requested current list of medications.)  4.  Tube feeding per dietician guidelines.  I provided order request to Dr. Sondra Come who wrote order.  I took order and list of medications to Ms [place] while she was meeting with Dory Peru, RD.  Dtr [name] indicated she would provide to [employer], along with copy of tube feeding guidelines.  I later confirmed with RN Donalda Ewings that order dtr would deliver requested information; I provided her my phone number for follow-up contact as needed.  Gayleen Orem, RN, BSN, [place] at [place] [phone]                     Time Spent with Patient: 30 (12/31/15 1310)

## 2015-12-31 NOTE — Progress Notes (Signed)
Nutrition follow-up completed with patient and 2 daughters after radiation therapy today. Patient is status post feeding tube placement  Pain is now controlled and patient was in good spirits  Patient will be transferring to [facility] for some additional care  Last weight documented as 133.6 pounds on March 6  Labs drawn today include magnesium 1.8, sodium 135, glucose 146, BUN 30.1, creatinine 1.3, and albumin 3.7.  Phosphorus is pending.  Nutrition diagnosis: Unintended weight loss and nutrition related knowledge deficit both continue   Estimated nutrition needs: 1750-2000 calories, 85-95 grams protein, 2 L fluid.  Intervention:  I educated patient and daughters on beginning bolus tube feedings using Osmolite 1.5  Patient will start with 120 cc Osmolite 1.5, 4 times a day at 8 AM, 12 PM 4 PM and 8 PM  Patient will flush tube with 60 cc free water before and after bolus feeding  If tolerated, patient will increase bolus feeding to 180 cc of Osmolite 1.5, 4 times a day on day 3  If tolerated patient will increase Osmolite 1.5 to 240 cc, 4 times a day on day 5  If patient continues to tolerate she will increase Osmolite 1.5-240 cc 5 times a day on day 7 at 8 AM, 11 AM 2 PM 5 PM and 8 PM  Patient was educated to consume an additional 480 cc of water by mouth or through to this tolerated. Patient able to demonstrate bolus tube feeding without difficulty  Written instructions were provided for patient.  Questions were answered.  Teach back method was used. Patient will have CMET, magnesium and phosphorus ordered to monitor for refeeding    Monitoring, evaluation, goals: Patient will tolerate tube feeding advancement without showing signs of refeeding syndrome   Next visit:Tuesday, January 05, 2016 after radiation treatment.  **Disclaimer: This note was dictated with voice recognition software. Similar sounding words can inadvertently be transcribed and this note may contain  transcription errors which may not have been corrected upon publication of note.**

## 2016-01-01 ENCOUNTER — Ambulatory Visit (HOSPITAL_BASED_OUTPATIENT_CLINIC_OR_DEPARTMENT_OTHER): Payer: Self-pay | Admitting: Adult Health

## 2016-01-01 ENCOUNTER — Ambulatory Visit
Admission: RE | Admit: 2016-01-01 | Discharge: 2016-01-01 | Disposition: A | Discharge status: Home / Self Care | Payer: Medicare Other | Source: Ambulatory Visit | Attending: Radiation Oncology | Admitting: Radiation Oncology

## 2016-01-01 ENCOUNTER — Encounter: Payer: Self-pay | Admitting: Adult Health

## 2016-01-01 VITALS — BP 110/52 | HR 104 | Resp 16

## 2016-01-01 DIAGNOSIS — Z510 Encounter for antineoplastic radiation therapy: Secondary | ICD-10-CM | POA: Diagnosis not present

## 2016-01-01 DIAGNOSIS — T402X5A Adverse effect of other opioids, initial encounter: Secondary | ICD-10-CM

## 2016-01-01 DIAGNOSIS — K5903 Drug induced constipation: Secondary | ICD-10-CM

## 2016-01-01 DIAGNOSIS — C01 Malignant neoplasm of base of tongue: Secondary | ICD-10-CM

## 2016-01-01 LAB — PHOSPHORUS: PHOSPHORUS: 4 mg/dL (ref 2.5–4.5)

## 2016-01-01 MED ORDER — SODIUM CHLORIDE 0.9 % IV SOLN
INTRAVENOUS | Status: AC
  Administered 2016-01-01: 15:00:00 via INTRAVENOUS

## 2016-01-01 MED ORDER — TRAMADOL HCL 50 MG PO TABS: 75.0000 mg | ORAL_TABLET | Freq: Every day | ORAL | Status: DC

## 2016-01-01 MED ORDER — ACETAMINOPHEN 500 MG PO TABS: 500.0000 mg | ORAL_TABLET | Freq: Four times a day (QID) | ORAL | Status: DC | PRN

## 2016-01-01 MED ORDER — POLYETHYLENE GLYCOL 3350 17 G PO PACK
17.0000 g | PACK | Freq: Every day | ORAL | Status: DC
Start: 2016-01-01 — End: 2016-01-12

## 2016-01-01 MED ORDER — TRAMADOL HCL 50 MG PO TABS: 75.0000 mg | ORAL_TABLET | ORAL | Status: DC | PRN

## 2016-01-01 MED ORDER — SENNA 8.6 MG PO TABS: 2.0000 | ORAL_TABLET | Freq: Every day | ORAL | Status: DC

## 2016-01-01 MED FILL — traMADol HCL 50 MG TABS: 50 | 17 days supply | Qty: 150 | Fill #0

## 2016-01-01 MED FILL — POLYETHYLENE GLYCOL 3350 PO: 31 days supply | Qty: 527 | Fill #0

## 2016-01-01 NOTE — Patient Instructions (Addendum)
Below are the recommendations for the changes we have made to your pain and bowel regimens.     For your pain:  -STOP taking the oxycodone. We have increased your Tramadol instead.  -Take Tramadol 75 mg by mouth/crushed via tube daily every morning (scheduled pain medication).  -You can then space out future doses of necessary Tramadol by mouth/crushed via tube every 4 hours as needed for pain.  -You can also take [phone] mg Tylenol every 6 hours as needed for pain; you can take Tylenol either with or in alternating doses with the Tramadol, depending on your pain level.  Do not exceed 4000 mg of Tylenol per day.    For your constipation:  -Take Senna 2 tabs by mouth/crushed via tube every night at bedtime.  You can titrate this to 1 pill or skip a dose if your stools become loose.  -Take Miralax 17 gm mixed with water via tube every morning.  This will help prevent constipation related to your pain medicine. You can hold a dose if you have loose stools.    For feeding tube schedule/water flushes: -I will have Barb contact the Dietitian at [facility] about potentially increasing the feeding tube water flushes since you are not able to drink very much by mouth right now.   -I will also ask her to help reinforce that nursing staff should not check for residuals before a scheduled tube feeding; you should receive your tube feedings as scheduled, unless you feel too full or have nausea or vomiting.      If you have any questions, please feel free to call me!  Mike Craze, NP [place] [phone]

## 2016-01-01 NOTE — Progress Notes (Signed)
REASON FOR VISIT:  Routine follow-up for head & neck cancer   BRIEF ONCOLOGIC HISTORY:    Malignant neoplasm of base of tongue ([place])   10/12/2015 Imaging CT neck: Base of tongue mass measuring approximately 14 mm. There may be additional tumor extending to the left of this mass.  Malignant adenopathy throughout right neck. Image quality degraded by moderate motion.    10/30/2015 Initial Biopsy Right base of tongue biopsy.  Path revealed mucoepidermoid carcinoma.     10/30/2015 Initial Diagnosis Malignant neoplasm of base of tongue ([place])   11/11/2015 Procedure Flexible fiberoptic laryngoscopy: Large submucosal mass noted at the base of tongue and vallecula on the right, with extension back to the vallecula with a more exophytic component directly in midline, which stopped short of the sulcus of the vallecula wi   11/13/2015 PET scan Irregular hypermetabolic mass in right oropharynx, measuring 3.9 x 3.3 cm, (considerably larger than reported on 10/12/15 CT neck). Hypermetabolic right level 2 and right level 3 LN mets. No distant hypermetabolic metastatic disease.     INTERVAL HISTORY:  Ms. Hooser has completed 13/20 fractions of radiation therapy as of today.  I am seeing her at the request of Dr. Isidore Moos, Gayleen Orem, RN, and the patient's daughter, [name]. [name] is present for today's visit.  Since our last visit, Ms. Hedman had successful placement of a G-tube by IR on Wednesday, 12/30/15.  On Wednesday evening, she reportedly had severe pain requiring Tramadol 50 mg along with Tylenol 1000 mg and was able to sleep for about 8 hours.  The following morning (Thursday, 12/31/15), she awoke with severe pain and again received Tramadol 50 mg along with Tylenol 1000 mg.  Later in the day, she received 2 separate doses of Oxycodone 5 mg, which "just makes me sleepy and I don't remember anything that happens after that."  Today (Friday, 01/01/16), she has had Oxycodone 5 mg at about 12 pm for pain before  leaving [facility] to come to the cancer center for her radiation treatment. Her biggest complaints today are pain and "just not feeling good."    Her pain is worse with movement, particularly at her G-tube insertion site. The pain is as high as 7/10 when she has to move; otherwise there is minimal pain there.  She also reports a sore mouth and sore throat, which makes it difficult to drink anything but sips of water every once in awhile.  She endorses very dry mouth. She reports feeling dizzy when she has to stand/change positions.  Her daughters are in the process of having her transitioned to the rehab portion of her current facility ([facility]), which the patient is not looking forward to, but understands why this is happening.  She has not had a BM in 3 days.  She just overall doesn't feel well today.    ADDITIONAL REVIEW OF SYSTEMS:  Review of Systems  Constitutional: Positive for malaise/fatigue. Negative for fever.  HENT: Positive for sore throat.        Right tongue/mouth pain that radiates up to right ear (+) xerostomia  Gastrointestinal: Positive for nausea and constipation. Negative for vomiting.       -Some nausea, no vomiting  -Chronic issues with constipation; last BM was 3 days ago   Genitourinary: Negative for dysuria and hematuria.       -chronic urinary incontinence; reports having to get up many times at night to urinate   Neurological: Positive for dizziness and weakness.       (+)  dizziness with standing      CURRENT MEDICATIONS:  Current Outpatient Prescriptions on File Prior to Visit  Medication Sig Dispense Refill  . Ascorbic Acid (VITAMIN C) 1000 MG tablet Take 1,000 mg by mouth daily.    . cholecalciferol (VITAMIN D) 1000 UNITS tablet Take 2,000 Units by mouth daily.    Marland Kitchen levothyroxine (SYNTHROID, LEVOTHROID) 75 MCG tablet TAKE 1 TABLET BY MOUTH EVERY MORNING 90 tablet 1  . lidocaine (XYLOCAINE) 2 % solution Patient: Mix 1part 2% viscous lidocaine, 1part H20.  Swish and/or swallow 47mL of this mixture, 37min before meals and at bedtime, up to QID 100 mL 5  . Nutritional Supplements (FEEDING SUPPLEMENT, OSMOLITE 1.5 CAL,) LIQD Begin 120 cc Osmolite 1.5 QID with 60 cc free water before and after each feeding. Day 3, increase to 180 cc Osmolite 1.5 as tolerated. Day 5 increase to 240 cc Osmolite 1.5 as tolerated. Day 7, increase to 240 cc Osmolite 1.5 5 times daily. Flush or drink an additional 240 cc free water daily. Please send feedings and supplies. 5 Bottle 0  . oxycodone (OXY-IR) 5 MG capsule Take 5 mg by mouth every 4 (four) hours as needed.    . predniSONE (DELTASONE) 5 MG tablet Take 2.5 mg by mouth daily with breakfast.   0  . sodium fluoride (FLUORISHIELD) 1.1 % GEL dental gel Instill one drop of gel per tooth space of fluoride tray. Place over teeth for 5 minutes. Remove. Spit out excess. Repeat nightly. 120 mL 11   No current facility-administered medications on file prior to visit.    ALLERGIES:  Allergies  Allergen Reactions  . Sulfa Antibiotics   . Ampicillin Swelling  . Chlorine   . Codeine   . Macrobid [Nitrofurantoin Monohydrate Macrocrystals]   . Statins   . Trimethoprim      PHYSICAL EXAM:  Filed Vitals:   01/01/16 1400 01/01/16 1405  BP: 128/46 110/52  Pulse: 89 104  Resp: 16    *Orthostatic VS as above *No weight obtained today (01/01/16); pt having difficulty standing related to abdominal pain.   Weight Date  133 lb 9.6 oz (60.601 kg) 12/28/15  138 lb 12.8 oz (62.959 kg) 12/24/15  138 lb 11.2 oz (62.914 kg) 12/21/15  140 lb 3.2 oz (63.594 kg) 12/14/15  142 lb 8 oz (64.638 kg) 12/08/15  143 lb 3.2 oz (64.955 kg) Pre-treatment: 12/04/15   General: Elderly female in no acute distress.  Accompanied in clinic today by her daughter, [name].  HEENT: Head is atraumatic and normocephalic.  Pupils equal and reactive to light. Conjunctivae clear without exudate.  Sclerae anicteric. Oral mucosa is dry; saliva thick/ropey. Tongue with  black coating; visible posterior right tongue mass. Oropharynx difficult to visualize. Mild mucositis noted to buccal mucosa.  Cardiovascular: Mild tachycardia, but normal rhythm. Respiratory: Clear to auscultation bilaterally.  GI: Abdomen soft and round. Tender to palpation at G-tube site. G-tube dressing clean, dry, and intact. Bowel sounds mildly hypoactive.  GU: Deferred.   Neuro: No focal deficits. Steady gait.  Psych: Normal mood and affect for situation. Extremities: No edema, cyanosis, or clubbing.   Skin: Warm and dry.    LABORATORY DATA:  None at this visit.  DIAGNOSTIC IMAGING:  None at this visit.    ASSESSMENT & PLAN:  Ms. Gazzola is a very pleasant 82 y.o. 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  She is currently undergoing palliative radiation to her head & neck; she completed fraction #  13/20 today.   1. Cancer of the right base of tongue: Ms. Behnen has elected not to pursue treatment with surgery or chemotherapy after consultations at both [facility] and [facility].  She will continue with her scheduled palliative radiation therapy, under the care of Dr. Isidore Moos.   2. Pain control: I spent the majority of our lengthy visit today discussing effective pain management and optimizing her current pain regimen.  We discussed different options available to her to treat her pain.  I am trying to better understand her goals of care, and whether having less pain and being "out of it" is more important (i.e. With oxycodone) to her than having more pain, but being more functional (i.e. With Tramadol).  Ultimately, Ms. Kaczynski expressed how much she values her independence and she really does not like the way oxycodone makes her feel.  Therefore, I have discontinued the oxycodone today. Below is the pain regimen we have decided upon together: -Tramadol 75 mg (increased from 50 mg) once daily. She should take this every morning as a scheduled  pain medication, in order to help manage worsening pain with movement, as well as her mouth pain.  -Tramadol 75 mg Q4hprn. She can also use Tramadol as needed, either in conjunction or alternation with Tylenol.  -Tylenol [phone] mg Q6hprn to supplement pain management; total daily dose not to exceed 4000 mg.    All of these instructions were provided in writing to the patient and her daughter today.  Prescriptions were given.  I also asked that they share a copy of these recommendations with the [facility] staff as well.    3. Dehydration/Mild orthostatic hypotension: Clinically, Ms. Henthorn appears dehydrated today. She reports that she is not drinking much at all (only sips) due to sore throat.  She is receiving scheduled water flushes via G-tube with her tube feedings, but she states that she feels dehydrated.  She had mild orthostatic hypotension today with reported dizziness, slight decrease in BP, but increase of HR in 100s with standing.  I discussed with her the importance of adequate fluid intake, along with her tube feedings.  I will ask Dory Peru, RD to consider increasing her free water flush volumes while the patient is unable to tolerate oral intake. I talked to the patient about receiving IV fluids today, in the setting of her dizziness and decreased fluid intake. She agreed.  A #22 gauge peripheral IV was placed by myself in the left arm without difficulty. Patient received 1 L NS IV over 1.5 hours in clinic today.  She tolerated infusion without complaints. PIV was removed after completing infusion and clinically, she was much improved.   4. Transition to rehab/Patient safety: The family is working on getting the patient transitioned to rehab.  I offered her support today through active listening and normalization of her described loss of independence.  I encouraged her to think of her transition to rehab as an opportunity for her to gain strength, but also increase the team of  professionals all with the goal of improving her healing and giving her support.  I also stressed the importance of her safety and preventing falls/other injuries during this time of weakness/active treatment. These concerns will continue when she completes treatment and continues to recover and regain strength.   5. Protein-calorie malnutrition: Ms. Rubsam is tolerating her tube feedings very well. The only issue is that one of her feedings was held today by the nursing staff at [facility] because she had residuals  prior to the feeding. Ms. Cappa daughter tells me that the dietitian at [facility] became aware that this happened and educated the nursing staff not to skip feedings based solely on stomach residuals.  I reinforced that Ms. Moritz should have her tube feedings as scheduled, unless she feels too full or is having nausea or vomiting.  I also think she likely needs additional water flushes, as stated above. I will defer to Dory Peru, RD's recommendations regarding increasing her free water flushes.    6. Constipation prevention/Adequate bowel regimen:  Ms. Hackworth has not had a BM in 3 days.  I reinforced the importance of adequate bowel regimen, particularly in the setting of increasing her pain medications today.  I have prescribed Senna 2 tabs QHS and Miralax daily every morning. I gave the patient and daughter written instructions that include titrating her bowel regimen medications if she has loose stools.  I also asked that they share this plan with the [place] staff as well.   If she does not have a bowel movement today or tomorrow, then I encouraged the patient to use a suppository or enema tomorrow in order to have a bowel movement. (suppository has previously been successful for the patient). I reinforced the importance of daily or every other day BMs in the setting of opiate use. Ms. Zupko voiced understanding.    I encouraged Ms. Vicker and her daughter,  [name], to ask questions regarding her above plan of care. All questions were answered to her stated satisfaction.  A written copy of the After Visit Summary with the plan of care was provided to the patient with an extra copy to share with the [facility] staff.  They know to call me with additional questions.    Dispo:  -Of note: Code Status-DNR -I will ask Dory Peru, RD to follow-up with [facility] staff regarding not checking residuals at her scheduled tube feeding times; also the patient may need an increase in water flushes since she is drinking only minimal amounts by mouth.   -Dr. Isidore Moos is aware of the patient's status and plan at today's visit.  -Dr. Isidore Moos will see the patient on Monday for regularly scheduled under treatment visit.  -I can see Ms. Byard as needed to continue to support her during and after her radiation therapy is complete.    A total of 60 minutes was spent with this patient in face-to-face care, with greater than 50% of that time in counseling and care-coordination An additional 90 minutes of observation/monitoring during IV fluid infusion was provided today as well.    Mike Craze, NP [place] [phone]    (Coding/Billing notation: This patient is a no charge per Dr. Pablo Ledger, Medical Director of Survivorship; pt has never been seen by medical oncology)

## 2016-01-04 ENCOUNTER — Ambulatory Visit
Admission: RE | Admit: 2016-01-04 | Discharge: 2016-01-04 | Disposition: A | Discharge status: Home / Self Care | Payer: Medicare Other | Source: Ambulatory Visit | Attending: Radiation Oncology | Admitting: Radiation Oncology

## 2016-01-04 ENCOUNTER — Encounter: Payer: Self-pay | Admitting: Radiation Oncology

## 2016-01-04 ENCOUNTER — Encounter: Payer: Self-pay | Admitting: Nutrition

## 2016-01-04 ENCOUNTER — Non-Acute Institutional Stay (SKILLED_NURSING_FACILITY): Payer: Medicare Other | Admitting: Internal Medicine

## 2016-01-04 VITALS — BP 128/50 | HR 78 | Temp 97.6°F | Ht 62.0 in | Wt 134.7 lb

## 2016-01-04 DIAGNOSIS — E039 Hypothyroidism, unspecified: Secondary | ICD-10-CM

## 2016-01-04 DIAGNOSIS — K5901 Slow transit constipation: Secondary | ICD-10-CM | POA: Diagnosis not present

## 2016-01-04 DIAGNOSIS — R1310 Dysphagia, unspecified: Secondary | ICD-10-CM | POA: Diagnosis not present

## 2016-01-04 DIAGNOSIS — L120 Bullous pemphigoid: Secondary | ICD-10-CM | POA: Diagnosis not present

## 2016-01-04 DIAGNOSIS — Z931 Gastrostomy status: Secondary | ICD-10-CM | POA: Diagnosis not present

## 2016-01-04 DIAGNOSIS — Z510 Encounter for antineoplastic radiation therapy: Secondary | ICD-10-CM | POA: Diagnosis not present

## 2016-01-04 DIAGNOSIS — C01 Malignant neoplasm of base of tongue: Secondary | ICD-10-CM | POA: Diagnosis not present

## 2016-01-04 MED ORDER — TRAMADOL HCL 50 MG PO TABS: 50.0000 mg | ORAL_TABLET | ORAL | Status: DC | PRN

## 2016-01-04 NOTE — Progress Notes (Signed)
Patient ID: [name], female   DOB: May 10, 1924, 82 y.o.   MRN: [id number]  Provider:  Rexene Edison. Mariea Clonts, D.O., C.M.D. Location:  [facility] Room Number: 146 Place of Service:  SNF (31)  PCP: Hollace Kinnier, DO Patient Care Team: Gayland Curry, DO as PCP - General (Geriatric Medicine) [facility] Deneise Lever, MD as Consulting Physician (Pulmonary Disease) Jerrell Belfast, MD as Consulting Physician (Otolaryngology) Rolm Bookbinder, MD as Consulting Physician (Dermatology) [name], MD as Attending Physician (Radiation Oncology) Holley Bouche, NP as Nurse Practitioner (Nurse Practitioner)  Extended Emergency Contact Information Primary Emergency Contact: San Jet[place] of [place] Phone: [phone] Relation: Other Secondary Emergency Contact: Cory,[name]          [place], [place] Mobile Phone: [phone] Relation: Daughter  Code Status: DNR Goals of Care: Advanced Directive information Advanced Directives 01/25/2016  Does patient have an advance directive? Yes  Type of Paramedic of [place];Living will  Copy of advanced directive(s) in chart? No - copy requested  Pre-existing out of facility DNR order (yellow form or pink MOST form) -      Chief Complaint  Patient presents with  . New Admit To SNF    s/p PEG tube placement while on XRT for tongue cancer    HPI: Patient is a 82 y.o. female seen today for admission to [facility] rehab s/p PEG placement for tube feedings while on XRT for her tongue cancer.  [name] is known to me from clinic.  She has a h/o osteoporosis, incontinence, htn, hyperlipidemia, asthma, hypothyroidism, bullous pemphigoid and most recently, a mass on her right posterior tongue.  She began having some symptoms in Oct of last year with right-sided ear pain for a few days and a sensitive throat.  She had some  ulcerations of her posterior tongue that were suspected to be related to the pemphigoid at first.  She began losing weight (10 lbs from Oct to Jan) and this developed into a mass on her right posterior tongue by Jan of this year.  She underwent biopsy on 10/30/15 by Dr. 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  It was decided that palliative radiation treatment would be the best option for her in hopes of shrinking the mass to allow her to eat and drink better as she was having difficulty due to pain in her mouth.  She was monitored by nutrition, dentist and speech therapy.  She did develop increasing pain and dysphagia as the radiation continued.  Due to these concerns and difficulty maintaining her weight as a result, a PEG tube was placd on 12/30/15.    When seen here, she was continuing to have some discomfort in her right posterior mouth and throat.  She requested her tramadol be changed to q4 hrs prn pain.  She also has had some difficulty with constipation here that has decreased her appetite.  She feels a bit weak but is working on this with therapy.    Past Medical History  Diagnosis Date  . Nasal polyps   . Osteoporosis   . Mitral valve disorders   . Chronic rhinitis   . Unspecified urinary incontinence   . Asthma   . Hyperlipidemia   . Hypertension   . Urge incontinence   . Unspecified hypothyroidism   . Bilateral claudication of lower limb ([place]) 08/29/2013    Bilateral posterior thighs  . Aortic atherosclerosis ([place]) 08/29/2013  Identified on Aortic ultrasound exam 2008   . Bullous pemphigoid 02/2015    Dr. Wilhemina Bonito  . Arthritis    Past Surgical History  Procedure Laterality Date  . Nasal polyp surgery  03/2002    x2 Dr Wilburn Cornelia  . Tonsillectomy    . Total abdominal hysterectomy w/ bilateral salpingoophorectomy  1970's  . Bunionectomy Left 12/1998  . Milk cyst right breast    . Breast lumpectomy Right 1953  . Bunionectomy Right 02/2000  . Eye surgery Bilateral  02/2000    Cataract removal  . Root canal  02/2014  . Direct laryngoscopy N/A 10/30/2015    Procedure: DIRECT LARYNGOSCOPY AND BIOPSY OF TONGUE MASS;  Surgeon: Jerrell Belfast, MD;  Location: [facility];  Service: ENT;  Laterality: N/A;    reports that she has never smoked. She has never used smokeless tobacco. She reports that she drinks about 1.2 oz of alcohol per week. She reports that she does not use illicit drugs. Social History   Social History  . Marital Status: Widowed    Spouse Name: N/A  . Number of Children: 3  . Years of Education: N/A   Occupational History  . retired Pharmacist, hospital    Social History Main Topics  . Smoking status: Never Smoker   . Smokeless tobacco: Never Used     Comment: Positive hx of passive tobacco smoke exposure  . Alcohol Use: 1.2 oz/week    2 Glasses of wine per week     Comment: 2 wine a week -social  . Drug Use: No  . Sexual Activity: No   Other Topics Concern  . Not on file   Social History Narrative   Widowed, married in [place], spouse died 10-23-1998.    Patient lives in [place] since 2007.    Patient has a living well.    She is a retired Pharmacist, hospital.    Never smoked   Alcohol: wine twice a week    Exercises 3 times a week    Functional Status Survey: Is the patient deaf or have difficulty hearing?: No Does the patient have difficulty seeing, even when wearing glasses/contacts?: No Does the patient have difficulty concentrating, remembering, or making decisions?: No Does the patient have difficulty walking or climbing stairs?: Yes (due to weakness) Does the patient have difficulty dressing or bathing?: Yes (due to weakness, is able with help) Does the patient have difficulty doing errands alone such as visiting a doctor's office or shopping?: Yes (due to weakness, is able with help, but stamina short-lived)  Family History  Problem Relation Age of Onset  . Diabetes Mother     Adult onset  . Arthritis  Mother   . Breast cancer Mother 16  . Heart attack Father   . Diabetes Father     Adult onset  . Heart disease Father   . Heart Problems Father     Arrhythmia requiring pacemaker  . Diabetes type II      sibling  . Allergies      1/2 sister(father's side)  . Diabetes Brother   . Fibromyalgia Daughter     Health Maintenance  Topic Date Due  . Samul Dada  10/01/1943  . ZOSTAVAX  10/01/1986  . INFLUENZA VACCINE  05/24/2016  . DEXA SCAN  Completed  . PNA vac Low Risk Adult  Completed    Allergies  Allergen Reactions  . Sulfa Antibiotics   . Ampicillin Swelling  . Chlorine   . Codeine   .  Macrobid [Nitrofurantoin Monohydrate Macrocrystals]   . Statins   . Trimethoprim       Medication List       This list is accurate as of: 01/04/16 11:59 PM.  Always use your most recent med list.               acetaminophen 500 MG tablet  Commonly known as:  TYLENOL  Take 1 tablet (500 mg total) by mouth every 6 (six) hours as needed for mild pain. May take 1-2 tablets. Do not exceed 4000 mg per day.     cholecalciferol 1000 units tablet  Commonly known as:  VITAMIN D  Take 2,000 Units by mouth daily.     feeding supplement (OSMOLITE 1.5 CAL) Liqd  Begin 120 cc Osmolite 1.5 QID with 60 cc free water before and after each feeding. Day 3, increase to 180 cc Osmolite 1.5 as tolerated. Day 5 increase to 240 cc Osmolite 1.5 as tolerated. Day 7, increase to 240 cc Osmolite 1.5 5 times daily. Flush or drink an additional 240 cc free water daily. Please send feedings and supplies.     levothyroxine 75 MCG tablet  Commonly known as:  SYNTHROID, LEVOTHROID  TAKE 1 TABLET BY MOUTH EVERY MORNING     lidocaine 2 % solution  Commonly known as:  XYLOCAINE  Patient: Mix 1part 2% viscous lidocaine, 1part H20. Swish and/or swallow 61mL of this mixture, 23min before meals and at bedtime, up to QID     polyethylene glycol packet  Commonly known as:  MIRALAX  Take 17 g by mouth daily.      predniSONE 5 MG tablet  Commonly known as:  DELTASONE  Take 2.5 mg by mouth daily with breakfast.     senna 8.6 MG Tabs tablet  Commonly known as:  SENOKOT  Take 2 tablets (17.2 mg total) by mouth at bedtime. May take every other day if stools become loose.     sodium fluoride 1.1 % Gel dental gel  Commonly known as:  FLUORISHIELD  Instill one drop of gel per tooth space of fluoride tray. Place over teeth for 5 minutes. Remove. Spit out excess. Repeat nightly.     traMADol 50 MG tablet  Commonly known as:  ULTRAM  Take 1.5 tablets (75 mg total) by mouth daily.     traMADol 50 MG tablet  Commonly known as:  ULTRAM  Take 1 tablet (50 mg total) by mouth every 4 (four) hours as needed for moderate pain or severe pain.     vitamin C 1000 MG tablet  Take 1,000 mg by mouth daily.        Review of Systems  Constitutional: Positive for malaise/fatigue. Negative for fever and chills.  HENT: Positive for sore throat. Negative for ear pain.        Mouth pain  Eyes: Negative for blurred vision.  Respiratory: Negative for shortness of breath.   Cardiovascular: Negative for chest pain, palpitations and leg swelling.  Gastrointestinal: Positive for abdominal pain and constipation. Negative for heartburn, nausea, vomiting, diarrhea, blood in stool and melena.  Genitourinary: Positive for urgency. Negative for dysuria and frequency.  Musculoskeletal: Negative for myalgias and falls.  Skin: Negative for itching and rash.  Neurological: Positive for weakness. Negative for dizziness and loss of consciousness.  Endo/Heme/Allergies: Bruises/bleeds easily.  Psychiatric/Behavioral: Negative for depression and memory loss.    Filed Vitals:   01/04/16 1242  BP: 150/70  Pulse: 87  Temp: 97.1 F (36.2 C)  Resp: 16  Height: 5\' 2"  (1.575 m)  Weight: 135 lb 3.2 oz (61.326 kg)  SpO2: 95%   Body mass index is 24.72 kg/(m^2). Physical Exam  Constitutional: She is oriented to person, place, and  time. She appears well-developed. No distress.  HENT:  Head: Normocephalic and atraumatic.  Right Ear: External ear normal.  Left Ear: External ear normal.  Nose: Nose normal.  Mouth/Throat: Oropharynx is clear and moist. No oropharyngeal exudate.  Tongue mass no longer visible  Eyes: Conjunctivae and EOM are normal. Pupils are equal, round, and reactive to light.  Neck: Normal range of motion. Neck supple. No JVD present.  Cardiovascular: Normal rate, regular rhythm, normal heart sounds and intact distal pulses.   Pulmonary/Chest: Effort normal and breath sounds normal. No respiratory distress.  Abdominal: Soft. Bowel sounds are normal. She exhibits no distension and no mass. There is no tenderness. There is no rebound and no guarding.  Some soreness at PEG site, but no drainage or erythema to suggest infection  Musculoskeletal: Normal range of motion.  Ambulates with walker  Lymphadenopathy:    She has no cervical adenopathy.  Neurological: She is alert and oriented to person, place, and time.  Skin:  Darkening of skin at site of radiation on right side of neck  Psychiatric: She has a normal mood and affect.    Labs reviewed: Basic Metabolic Panel:  Recent Labs  10/23/15 0940  12/30/15 1150 12/31/15 1442 12/31/15 1443 01/07/16 1037 01/11/16 1045 01/11/16 1045  NA 141  --  139  --  135* 135* 136  --   K 4.3  --  4.1  --  4.6 4.3 4.6  --   CL 105  --  105  --   --   --   --   --   CO2 27  --  26  --  25 28 30*  --   GLUCOSE 94  --  90  --  146* 123 165*  --   BUN 17  < > 20  --  30.1* 24.2 25.8  --   CREATININE 0.93  < > 0.91  --  1.3* 0.9 0.9  --   CALCIUM 9.5  --  9.3  --  10.0 9.6 9.8  --   MG  --   --   --   --  1.8 1.8 2.2  --   PHOS  --   --   --  4.0  --  3.1  --  3.2  < > = values in this interval not displayed. Liver Function Tests:  Recent Labs  12/31/15 1443 01/07/16 1037 01/11/16 1045  AST 16 22 38*  ALT 10 24 47  ALKPHOS 66 67 88  BILITOT 0.86  0.38 0.32  PROT 7.4 6.4 6.6  ALBUMIN 3.7 2.9* 3.0*   No results for input(s): LIPASE, AMYLASE in the last 8760 hours. No results for input(s): AMMONIA in the last 8760 hours. CBC:  Recent Labs  06/09/15 10/23/15 0940 12/30/15 1150  WBC 8.1 9.9 9.1  NEUTROABS  --   --  7.4  HGB 13.4 12.8 13.4  HCT 40 40.7 42.2  MCV  --  99.5 99.3  PLT 378 390 382   Cardiac Enzymes: No results for input(s): CKTOTAL, CKMB, CKMBINDEX, TROPONINI in the last 8760 hours. BNP: Invalid input(s): POCBNP No results found for: HGBA1C Lab Results  Component Value Date   TSH 2.22 02/20/2014   No results found for: VITAMINB12 No results  found for: FOLATE No results found for: IRON, TIBC, FERRITIN  Imaging and Procedures obtained prior to SNF admission: 12/30/15:  Successful fluoroscopic insertion of a 20-French pull-through gastrostomy tube. The gastrostomy may be used immediately for medication administration and in 24 hrs for the initiation of feeds.  Assessment/Plan 1. Malignant neoplasm of base of tongue (HCC) -finishing up palliative XRT -seems it has been helpful at reducing the size of the tumor based on exam -currently having significant pain and glad treatments are almost complete -working with PT and OT for weakness with goal to return home soon  2. PEG (percutaneous endoscopic gastrostomy) status ([place]) -in place and receiving feedings through here and most meds   3. Bullous pemphigoid -continues on steroid therapy for this and has done fairly well during XRT as far as this goes  4. Hypothyroidism, unspecified hypothyroidism type -cont current synthroid and monitor  5. Dysphagia -cont enteral feeding, then gradually advance when XRT complete and as deemed safe by ST  6. Slow transit constipation -using standing orders and miralax daily for this  -had one episode where she got too much of several constipation meds and she "had a blowout" which mad her uncomfortable so she may go down  to every other day if that works better or 1/2 dose daily.  Family/ staff Communication: discussed with rehab nurse and one of her daughters who was here form out of town  Labs/tests ordered:  No new  Joby Hershkowitz L. Kotaro Buer, D.O. [facility] [address], [place] [address] Cell Phone (Mon-Fri 8am-5pm):  [phone] On Call:  [phone] & follow prompts after 5pm & weekends Office Phone:  [phone] Office Fax:  [phone]

## 2016-01-04 NOTE — Progress Notes (Addendum)
Weekly Management Note:  Outpatient    ICD-9-CM ICD-10-CM   1. Malignant neoplasm of base of tongue (HCC) 141.0 C01      Current Dose: 32.2 Gy  Projected Dose: 46 Gy   Narrative: [name] is here w/  Daughter.  Pain is better in abdomen than last week, healing from PEG.  Pain in mouth continues.  Has order at [facility] for Tramadol q 6 hrs prn with tylenol in between. She is not taking all prn doses of pain meds. Forgot they can be given via PEG. Weight stable.  Physical Findings:  Wt Readings from Last 3 Encounters:  01/04/16 134 lb 11.2 oz (61.1 kg)  12/30/15 133 lb 9.6 oz (60.601 kg)  12/28/15 133 lb 9.6 oz (60.601 kg)    height is 5\' 2"  (1.575 m) and weight is 134 lb 11.2 oz (61.1 kg). Her temperature is 97.6 F (36.4 C). Her blood pressure is 128/50 and her pulse is 78.   Tumor on tongue visible in posterior right tongue with lateral ulceration over right post tongue.  Tumor appears even smaller.  Skin erythematous, intact over right neck  CBC    Component Value Date/Time   WBC 9.1 12/30/2015 1150   WBC 8.1 06/09/2015   RBC 4.25 12/30/2015 1150   HGB 13.4 12/30/2015 1150   HCT 42.2 12/30/2015 1150   PLT 382 12/30/2015 1150   MCV 99.3 12/30/2015 1150   MCH 31.5 12/30/2015 1150   MCHC 31.8 12/30/2015 1150   RDW 13.8 12/30/2015 1150   LYMPHSABS 0.9 12/30/2015 1150   MONOABS 0.8 12/30/2015 1150   EOSABS 0.0 12/30/2015 1150   BASOSABS 0.0 12/30/2015 1150     CMP     Component Value Date/Time   NA 135* 12/31/2015 1443   NA 139 12/30/2015 1150   NA 140 06/09/2015   K 4.6 12/31/2015 1443   K 4.1 12/30/2015 1150   CL 105 12/30/2015 1150   CO2 25 12/31/2015 1443   CO2 26 12/30/2015 1150   GLUCOSE 146* 12/31/2015 1443   GLUCOSE 90 12/30/2015 1150   BUN 30.1* 12/31/2015 1443   BUN 20 12/30/2015 1150   BUN 34* 06/09/2015   CREATININE 1.3* 12/31/2015 1443   CREATININE 0.91 12/30/2015 1150   CREATININE 1.1 06/09/2015   CALCIUM 10.0 12/31/2015 1443   CALCIUM 9.3 12/30/2015 1150   PROT 7.4 12/31/2015 1443   ALBUMIN 3.7 12/31/2015 1443   AST 16 12/31/2015 1443   AST 16 06/09/2015   ALT 10 12/31/2015 1443   ALT 14 06/09/2015   ALKPHOS 66 12/31/2015 1443   ALKPHOS 54 06/09/2015   BILITOT 0.86 12/31/2015 1443   GFRNONAA 54* 12/30/2015 1150   GFRAA >60 12/30/2015 1150     Impression:  The patient is tolerating radiotherapy    Plan:  Take Tramadol 75 mg PRN every 6 hours as needed for pain. Always take morning dose. Take Tylenol 1000mg  in between PRN up to q 6hrs. I talked with [name] in rehab at [facility] to confirm this schedule.  Okay to give liquid meds instead via PEG.  Cont oral intake, ie ice cream, as well as PEG feedings per nutritionist. Pushing fluids.    To see [name] today for palliative care consult.  Discussed option of adding a week of RT for more aggressive treatment, as patient has concerns about tumor progression. But, more dose would increase risk of side effects. She's decided to decline taking more than the planned 20 treatments.  -----------------------------------  [name], MD

## 2016-01-04 NOTE — Progress Notes (Signed)
Brief follow-up with patient and daughter in waiting area. Per patient's daughter, patient has been refusing tube feeding secondary to feeling full or having constipation. Daughter reports she just doesn't want it It appears patient is receiving sporadically one or 2 bolus feeding daily with about 2-3 ounces at a feeding. She is eating very little by mouth. I have nutrition follow-up scheduled with patient tomorrow. Encouraged patient to attempt to increase tube feeding to 120 cc (4 oz.) 4 times a day and assess tolerance. Asked patient to consider continuous tube feeding to increase calories and protein.

## 2016-01-04 NOTE — Consult Note (Signed)
Consultation Note Date: 01/04/2016   Patient Name: [name]  DOB: August 07, 1924  MRN: [id number]  Age / Sex: 82 y.o., female  PCP: Gayland Curry, DO Referring Physician: No att. providers found  Reason for Consultation: Establishing goals of care, Non pain symptom management and Psychosocial/spiritual support    Clinical Assessment/Narrative:   This NP [name] reviewed medical records, received report from team, assessed the patient and then meet with the patient and with her daughter [name] in OP radiation-oncology clinic  to discuss diagnosis, prognosis, GOC, EOL wishes disposition and options.  Created space and opportunity for patient and her daughter to share thoughts, feelings and fears dealing with serious illness and its impact on daily living and relationships.    A detailed discussion was had today regarding advanced directives.  Concepts specific to code status, artifical feeding and hydration, continued IV antibiotics and rehospitalization was had.  The difference between a aggressive medical intervention path  and a palliative comfort care path for this patient at this time was had.  Values and goals of care important to patient and family were attempted to be elicited.  Concept of Hospice and Palliative Care were discussed  Natural trajectory and expectations at EOL were discussed.  Questions and concerns addressed.  Hard Choices booklet left for review. Family encouraged to call with questions or concerns.  PMT will continue to support holistically.   BRIEF ONCOLOGIC HISTORY:    Malignant neoplasm of base of tongue ([place])   10/12/2015 Imaging CT neck: Base of tongue mass measuring approximately 14 mm. There may be additional tumor extending to the left of this mass. Malignant adenopathy throughout right neck. Image quality degraded by moderate motion.    10/30/2015 Initial Biopsy Right  base of tongue biopsy. Path revealed mucoepidermoid carcinoma.    10/30/2015 Initial Diagnosis Malignant neoplasm of base of tongue ([place])   11/11/2015 Procedure Flexible fiberoptic laryngoscopy: Large submucosal mass noted at the base of tongue and vallecula on the right, with extension back to the vallecula with a more exophytic component directly in midline, which stopped short of the sulcus of the vallecula wi   11/13/2015 PET scan Irregular hypermetabolic mass in right oropharynx, measuring 3.9 x 3.3 cm, (considerably larger than reported on 10/12/15 CT neck). Hypermetabolic right level 2 and right level 3 LN mets. No distant hypermetabolic metastatic disease.         Radiation Oncology  INDICATION FOR TREATMENT: Aggressive Palliation   TREATMENT DATES: 12/16/2015-01/12/2016    SITE/DOSE: 1. Base of tongue and upper neck / 46 Gy in 20 fractions  2. Right lower neck / 40 Gy in 20 fractions       HCPOA: yes    SUMMARY OF RECOMMENDATIONS  Code Status/Advance Care Planning:  DNR   Code Status History    Date Active Date Inactive Code Status Order ID Comments User Context   06/17/2015  5:05 PM  DNR [id number]  Ripley Fraise, CMA Outpatient    Questions for Most Recent Historical Code Status (Order [id number])    Question Answer Comment   In the event of cardiac or respiratory ARREST Do not call a "code blue"    In the event of cardiac or respiratory ARREST Do not perform Intubation, CPR, defibrillation or ACLS    In the event of cardiac or respiratory ARREST Use medication by any route, position, wound care, and other measures to relive pain and suffering. May use oxygen, suction and manual treatment of airway obstruction as needed  for comfort.       Other Directives:Living Will,  MOST form intoduced  Symptom Management:   Pain (both tongue and PEG site):  Continue  Tramadol as prescribed   Mindfulness:  Discussed with patietn use of breathing for relaxation and pain relief.   Patient participated in 10 minute mindfulness session   Palliative Prophylaxis:   Bowel Regimen, Frequent Pain Assessment and Oral Care   Psycho-social/Spiritual:  Support System: Strong  Patient main goal and hope is to "get strong enough" to retrun to her [facility] housing at [facility].  Chief Complaint/ Primary Diagnoses: Present on Admission:  **None**  I have reviewed the medical record, interviewed the patient and family, and examined the patient. The following aspects are pertinent.  Past Medical History  Diagnosis Date  . Nasal polyps   . Osteoporosis   . Mitral valve disorders   . Chronic rhinitis   . Unspecified urinary incontinence   . Asthma   . Hyperlipidemia   . Hypertension   . Urge incontinence   . Unspecified hypothyroidism   . Bilateral claudication of lower limb ([place]) 08/29/2013    Bilateral posterior thighs  . Aortic atherosclerosis ([place]) 08/29/2013    Identified on Aortic ultrasound exam 2008   . Bullous pemphigoid 02/2015    Dr. Wilhemina Bonito  . Arthritis    Social History   Social History  . Marital Status: Widowed    Spouse Name: N/A  . Number of Children: 3  . Years of Education: N/A   Occupational History  . retired Pharmacist, hospital    Social History Main Topics  . Smoking status: Never Smoker   . Smokeless tobacco: Never Used     Comment: Positive hx of passive tobacco smoke exposure  . Alcohol Use: 1.2 oz/week    2 Glasses of wine per week     Comment: 2 wine a week -social  . Drug Use: No  . Sexual Activity: No   Other Topics Concern  . Not on file   Social History Narrative   Widowed, married in [place], spouse died 1998/10/26.    Patient lives in [place] at [place] since 2007.    Patient has a living well.    She is a retired Pharmacist, hospital.    Never smoked   Alcohol: wine twice a week    Exercises 3 times a  week   Family History  Problem Relation Age of Onset  . Diabetes Mother     Adult onset  . Arthritis Mother   . Breast cancer Mother 48  . Heart attack Father   . Diabetes Father     Adult onset  . Heart disease Father   . Heart Problems Father     Arrhythmia requiring pacemaker  . Diabetes type II      sibling  . Allergies      1/2 sister(father's side)  . Diabetes Brother   . Fibromyalgia Daughter    Scheduled Meds: Continuous Infusions: PRN Meds:. Medications Prior to Admission:  Prior to Admission medications   Medication Sig Start Date End Date Taking? Authorizing Provider  acetaminophen (TYLENOL) 500 MG tablet Take 1 tablet (500 mg total) by mouth every 6 (six) hours as needed for mild pain. May take 1-2 tablets. Do not exceed 4000 mg per day. 01/01/16   Holley Bouche, NP  Ascorbic Acid (VITAMIN C) 1000 MG tablet Take 1,000 mg by mouth daily.    Historical Provider, MD  cholecalciferol (VITAMIN D)  1000 UNITS tablet Take 2,000 Units by mouth daily.    Historical Provider, MD  levothyroxine (SYNTHROID, LEVOTHROID) 75 MCG tablet TAKE 1 TABLET BY MOUTH EVERY MORNING 08/07/15   [name] L Reed, DO  lidocaine (XYLOCAINE) 2 % solution Patient: Mix 1part 2% viscous lidocaine, 1part H20. Swish and/or swallow 62mL of this mixture, 45min before meals and at bedtime, up to QID 12/04/15   [name], MD  Nutritional Supplements (FEEDING SUPPLEMENT, OSMOLITE 1.5 CAL,) LIQD Begin 120 cc Osmolite 1.5 QID with 60 cc free water before and after each feeding. Day 3, increase to 180 cc Osmolite 1.5 as tolerated. Day 5 increase to 240 cc Osmolite 1.5 as tolerated. Day 7, increase to 240 cc Osmolite 1.5 5 times daily. Flush or drink an additional 240 cc free water daily. Please send feedings and supplies. 12/30/15   [name], MD  polyethylene glycol [facility]) packet Take 17 g by mouth daily. 01/01/16   Holley Bouche, NP  predniSONE (DELTASONE) 5 MG tablet Take 2.5 mg by mouth daily with  breakfast.  04/09/15   Historical Provider, MD  senna (SENOKOT) 8.6 MG TABS tablet Take 2 tablets (17.2 mg total) by mouth at bedtime. May take every other day if stools become loose. 01/01/16   Holley Bouche, NP  sodium fluoride (FLUORISHIELD) 1.1 % GEL dental gel Instill one drop of gel per tooth space of fluoride tray. Place over teeth for 5 minutes. Remove. Spit out excess. Repeat nightly. 12/08/15   Lenn Cal, DDS  traMADol (ULTRAM) 50 MG tablet Take 1.5 tablets (75 mg total) by mouth daily. 01/01/16   Holley Bouche, NP  traMADol (ULTRAM) 50 MG tablet Take 1 tablet (50 mg total) by mouth every 4 (four) hours as needed for moderate pain or severe pain. 01/04/16   [name] L Reed, DO   Allergies  Allergen Reactions  . Sulfa Antibiotics   . Ampicillin Swelling  . Chlorine   . Codeine   . Macrobid [Nitrofurantoin Monohydrate Macrocrystals]   . Statins   . Trimethoprim     Review of Systems  Constitutional: Positive for activity change, appetite change and fatigue.  HENT: Positive for trouble swallowing.   Gastrointestinal: Positive for abdominal pain.    Physical Exam  Constitutional: She appears ill.  -frail  Neurological: She is alert.  Skin: Skin is warm and dry.    Vital Signs: There were no vitals taken for this visit.  SpO2:   O2 Device:  O2 Flow Rate: .   IO: Intake/output summary: No intake or output data in the 24 hours ending 01/04/16 1211  LBM:   Baseline Weight:   Most recent weight:        Palliative Assessment/Data:    Additional Data Reviewed:  CBC:    Component Value Date/Time   WBC 9.1 12/30/2015 1150   WBC 8.1 06/09/2015   HGB 13.4 12/30/2015 1150   HCT 42.2 12/30/2015 1150   PLT 382 12/30/2015 1150   MCV 99.3 12/30/2015 1150   NEUTROABS 7.4 12/30/2015 1150   LYMPHSABS 0.9 12/30/2015 1150   MONOABS 0.8 12/30/2015 1150   EOSABS 0.0 12/30/2015 1150   BASOSABS 0.0 12/30/2015 1150   Comprehensive Metabolic Panel:    Component  Value Date/Time   NA 135* 12/31/2015 1443   NA 139 12/30/2015 1150   NA 140 06/09/2015   K 4.6 12/31/2015 1443   K 4.1 12/30/2015 1150   CL 105 12/30/2015 1150   CO2 25 12/31/2015 1443  CO2 26 12/30/2015 1150   BUN 30.1* 12/31/2015 1443   BUN 20 12/30/2015 1150   BUN 34* 06/09/2015   CREATININE 1.3* 12/31/2015 1443   CREATININE 0.91 12/30/2015 1150   CREATININE 1.1 06/09/2015   GLUCOSE 146* 12/31/2015 1443   GLUCOSE 90 12/30/2015 1150   CALCIUM 10.0 12/31/2015 1443   CALCIUM 9.3 12/30/2015 1150   AST 16 12/31/2015 1443   AST 16 06/09/2015   ALT 10 12/31/2015 1443   ALT 14 06/09/2015   ALKPHOS 66 12/31/2015 1443   ALKPHOS 54 06/09/2015   BILITOT 0.86 12/31/2015 1443   PROT 7.4 12/31/2015 1443   ALBUMIN 3.7 12/31/2015 1443   Discussed with Dr Isidore Moos  Time In: 1200 Time Out: 1330 Time Total: 90 min Greater than 50%  of this time was spent counseling and coordinating care related to the above assessment and plan.  Signed by: [name], NP  Knox Royalty, NP  01/04/2016, 12:11 PM  Please contact Palliative Medicine Team phone at [phone] for questions and concerns.

## 2016-01-04 NOTE — Progress Notes (Signed)
[name] presents for her14th fraction of radiation to her Base of Tongue and Nodes. She rates her pain a 3/10 in her mouth and PEG tube site. She is taking her ultram every 6 hours at this time, because it was changed over the weekend from every 4 hours by the on call doctor at [facility]. The medicine was changed on Friday from 50 mg every 4 hours prn to 75 mg every 4 hours prn. The on call at [facility] spoke with Radiation on call and they felt like 75 mg every 4 hours was too much and increased it to every 6 hours per [name]'s daughter. However her pain is not relieved with this change, they are asking for the frequency to be changed to every 4 hours at 75 mg. The skin to her neck is slightly red, she is using the sonafine cream once daily and I encouraged her to use it twice daily. She is taking very little by mouth except ice cream and water. She is drinking up to 8 ounces of water daily orally. She reports she is receiving some osmolite at [facility]. It varies over the last few days related to constipation. She reports feeling full at times when she is receiving Osmolite. They spoke with Dory Peru in the waiting room and she encouraged them to increase her osmolite to 4 cans. She is taking miralax and sennakot and had a good bowel movement yesterday, and will continue these medicines. She does report fatigue, though she has been trying to walk at [facility] with her walker.   BP 128/50 mmHg  Pulse 78  Temp(Src) 97.6 F (36.4 C)  Ht 5\' 2"  (1.575 m)  Wt 134 lb 11.2 oz (61.1 kg)  BMI 24.63 kg/m2   Wt Readings from Last 3 Encounters:  01/04/16 134 lb 11.2 oz (61.1 kg)  12/30/15 133 lb 9.6 oz (60.601 kg)  12/28/15 133 lb 9.6 oz (60.601 kg)

## 2016-01-05 ENCOUNTER — Ambulatory Visit: Payer: Medicare Other | Admitting: Nutrition

## 2016-01-05 ENCOUNTER — Ambulatory Visit
Admission: RE | Admit: 2016-01-05 | Discharge: 2016-01-05 | Disposition: A | Discharge status: Home / Self Care | Payer: Medicare Other | Source: Ambulatory Visit | Attending: Radiation Oncology | Admitting: Radiation Oncology

## 2016-01-05 DIAGNOSIS — Z510 Encounter for antineoplastic radiation therapy: Secondary | ICD-10-CM | POA: Diagnosis not present

## 2016-01-05 NOTE — Progress Notes (Signed)
Nutrition follow-up completed with patient and daughter. Patient reports she did tolerate 120 cc of Osmolite 1.5 4 times yesterday. She is flushing feeding tube with 60 cc free water before and after bolus feeding. Patient demonstrated bolus feeding today in the office. Labs were reviewed. Weight improved.  134.7 pounds  Nutrition diagnosis: Unintended weight loss and nutrition related knowledge deficit improved.  Intervention:  Patient was educated to continue bolus feedings 120 cc for the rest of today. Patient should try to increase bolus tube feeding tomorrow 180 cc 4 times a day. Encouraged patient to drink water and ensure compact between feedings. Patient is agreeable to plan.  Monitoring, evaluation, goals: Patient will tolerate tube feeding advancement to meet greater than 90% of estimated nutrition needs.  Next visit: Tuesday, March 21.  After radiation therapy.  **Disclaimer: This note was dictated with voice recognition software. Similar sounding words can inadvertently be transcribed and this note may contain transcription errors which may not have been corrected upon publication of note.**

## 2016-01-06 ENCOUNTER — Other Ambulatory Visit: Payer: Self-pay | Admitting: Adult Health

## 2016-01-06 ENCOUNTER — Ambulatory Visit
Admission: RE | Admit: 2016-01-06 | Discharge: 2016-01-06 | Disposition: A | Discharge status: Home / Self Care | Payer: Medicare Other | Source: Ambulatory Visit | Attending: Radiation Oncology | Admitting: Radiation Oncology

## 2016-01-06 ENCOUNTER — Encounter: Payer: Self-pay | Admitting: *Deleted

## 2016-01-06 DIAGNOSIS — Z510 Encounter for antineoplastic radiation therapy: Secondary | ICD-10-CM | POA: Diagnosis not present

## 2016-01-06 DIAGNOSIS — C01 Malignant neoplasm of base of tongue: Secondary | ICD-10-CM

## 2016-01-06 NOTE — Progress Notes (Signed)
  Oncology Nurse Navigator Documentation  Navigator Location: [facility] (01/06/16 1150) Navigator Encounter Type: Treatment (01/06/16 1150)             Treatment Phase: Treatment (01/06/16 1150) Barriers/Navigation Needs: Education (01/06/16 1150)   Interventions: Education Method (01/06/16 1150)     Education Method: Verbal (01/06/16 1150)        To provide support and encouragement, care continuity and to assess for needs, met with [name] following her XRT.  He dtr [name] had accompanied her today.  I acknowledged the progress she has been making with her self-administration of supplement through her PEG, improved pain control.  She noted she is looking forward to her final tmt next week Tuesday.  Her dtr [name] is coming in this weekend to stay with her for a couple of weeks.  Gayleen Orem, RN, BSN, [place] at [place] [phone]             Time Spent with Patient: 30 (01/06/16 1150)

## 2016-01-07 ENCOUNTER — Ambulatory Visit (HOSPITAL_BASED_OUTPATIENT_CLINIC_OR_DEPARTMENT_OTHER): Payer: Medicare Other

## 2016-01-07 ENCOUNTER — Encounter: Payer: Self-pay | Admitting: *Deleted

## 2016-01-07 ENCOUNTER — Ambulatory Visit
Admission: RE | Admit: 2016-01-07 | Discharge: 2016-01-07 | Disposition: A | Discharge status: Home / Self Care | Payer: Medicare Other | Source: Ambulatory Visit | Attending: Radiation Oncology | Admitting: Radiation Oncology

## 2016-01-07 ENCOUNTER — Telehealth: Payer: Self-pay | Admitting: *Deleted

## 2016-01-07 DIAGNOSIS — C01 Malignant neoplasm of base of tongue: Secondary | ICD-10-CM

## 2016-01-07 DIAGNOSIS — Z510 Encounter for antineoplastic radiation therapy: Secondary | ICD-10-CM | POA: Diagnosis not present

## 2016-01-07 LAB — COMPREHENSIVE METABOLIC PANEL
ALT: 24 U/L (ref 0–55)
ANION GAP: 7 meq/L (ref 3–11)
AST: 22 U/L (ref 5–34)
Albumin: 2.9 g/dL — ABNORMAL LOW (ref 3.5–5.0)
Alkaline Phosphatase: 67 U/L (ref 40–150)
BILIRUBIN TOTAL: 0.38 mg/dL (ref 0.20–1.20)
BUN: 24.2 mg/dL (ref 7.0–26.0)
CALCIUM: 9.6 mg/dL (ref 8.4–10.4)
CO2: 28 meq/L (ref 22–29)
CREATININE: 0.9 mg/dL (ref 0.6–1.1)
Chloride: 99 mEq/L (ref 98–109)
EGFR: 59 mL/min/{1.73_m2} — ABNORMAL LOW (ref >90)
Glucose: 123 mg/dl (ref 70–140)
Potassium: 4.3 mEq/L (ref 3.5–5.1)
Sodium: 135 mEq/L — ABNORMAL LOW (ref 136–145)
TOTAL PROTEIN: 6.4 g/dL (ref 6.4–8.3)

## 2016-01-07 LAB — MAGNESIUM: MAGNESIUM: 1.8 mg/dL (ref 1.5–2.5)

## 2016-01-07 NOTE — Telephone Encounter (Signed)
  Oncology Nurse Navigator Documentation  Navigator Location: [facility] (01/07/16 [id number]) Navigator Encounter Type: Telephone (01/07/16 [id number]) Telephone: Jerilee Hoh Confirmation/Clarification (01/07/16 [id number])       Spoke with Ms. Doucette's dtr Beth, explained Barb Neff's request for twice-weekly labs until she reaches goal with nutritional supplement.  I informed her that her mother has a 10:30 lab appt today and at 10:45 on 3/21 prior to XRT appts, that future appts will established.  She voiced understanding.  Gayleen Orem, RN, BSN, [place] at [place] [phone]                                     Time Spent with Patient: 15 (01/07/16 [id number])

## 2016-01-07 NOTE — Progress Notes (Signed)
  Oncology Nurse Navigator Documentation  Navigator Location: [facility] (01/07/16 1150) Navigator Encounter Type: Other (01/07/16 1150)                 Education: Other (01/07/16 1150) Interventions: Coordination of Care (01/07/16 1150)       Met with Ms Koleen Distance and dtr [name] following XRT, explained Barb Neff's request for twice-weekly labs, specifically noting today's completed lab, next Tuesday's 10:45 lab appt.  I provided 4 copies of Epic calendar of appts through April, noted that additional lab appts will be scheduled per Barb's direction.  They voiced understanding.  Gayleen Orem, RN, BSN, [place] at [place] [phone]                   Time Spent with Patient: 15 (01/07/16 1150)

## 2016-01-07 NOTE — Telephone Encounter (Signed)
  Oncology Nurse Navigator Documentation  Navigator Location: [facility] (01/07/16 1424) Navigator Encounter Type: Telephone (01/07/16 1424) Telephone: Lahoma Crocker Call;Appt Confirmation/Clarification (01/07/16 1424)       Called pt dtr [name] to inform that next Tuesday's lab has been rescheduled for Monday at 10:45.  She voiced understanding.  Gayleen Orem, RN, BSN, [place] at [place] [phone]                                     Time Spent with Patient: 15 (01/07/16 1424)

## 2016-01-08 ENCOUNTER — Ambulatory Visit
Admission: RE | Admit: 2016-01-08 | Discharge: 2016-01-08 | Disposition: A | Discharge status: Home / Self Care | Payer: Medicare Other | Source: Ambulatory Visit | Attending: Radiation Oncology | Admitting: Radiation Oncology

## 2016-01-08 DIAGNOSIS — Z510 Encounter for antineoplastic radiation therapy: Secondary | ICD-10-CM | POA: Diagnosis not present

## 2016-01-08 LAB — PHOSPHORUS: Phosphorus, Ser: 3.1 mg/dL (ref 2.5–4.5)

## 2016-01-11 ENCOUNTER — Ambulatory Visit: Payer: Medicare Other

## 2016-01-11 ENCOUNTER — Ambulatory Visit
Admission: RE | Admit: 2016-01-11 | Discharge: 2016-01-11 | Disposition: A | Discharge status: Home / Self Care | Payer: Medicare Other | Source: Ambulatory Visit | Attending: Radiation Oncology | Admitting: Radiation Oncology

## 2016-01-11 ENCOUNTER — Encounter: Payer: Self-pay | Admitting: Radiation Oncology

## 2016-01-11 ENCOUNTER — Other Ambulatory Visit (HOSPITAL_BASED_OUTPATIENT_CLINIC_OR_DEPARTMENT_OTHER): Payer: Medicare Other

## 2016-01-11 VITALS — BP 142/47 | HR 84 | Temp 97.5°F | Resp 16 | Wt 134.6 lb

## 2016-01-11 DIAGNOSIS — C01 Malignant neoplasm of base of tongue: Secondary | ICD-10-CM | POA: Diagnosis present

## 2016-01-11 DIAGNOSIS — Z510 Encounter for antineoplastic radiation therapy: Secondary | ICD-10-CM | POA: Diagnosis not present

## 2016-01-11 LAB — COMPREHENSIVE METABOLIC PANEL
ALBUMIN: 3 g/dL — AB (ref 3.5–5.0)
ALK PHOS: 88 U/L (ref 40–150)
ALT: 47 U/L (ref 0–55)
AST: 38 U/L — AB (ref 5–34)
Anion Gap: 8 mEq/L (ref 3–11)
BUN: 25.8 mg/dL (ref 7.0–26.0)
CHLORIDE: 98 meq/L (ref 98–109)
CO2: 30 mEq/L — ABNORMAL HIGH (ref 22–29)
CREATININE: 0.9 mg/dL (ref 0.6–1.1)
Calcium: 9.8 mg/dL (ref 8.4–10.4)
EGFR: 58 mL/min/{1.73_m2} — ABNORMAL LOW (ref >90)
GLUCOSE: 165 mg/dL — AB (ref 70–140)
POTASSIUM: 4.6 meq/L (ref 3.5–5.1)
SODIUM: 136 meq/L (ref 136–145)
Total Bilirubin: 0.32 mg/dL (ref 0.20–1.20)
Total Protein: 6.6 g/dL (ref 6.4–8.3)

## 2016-01-11 LAB — MAGNESIUM: Magnesium: 2.2 mg/dl (ref 1.5–2.5)

## 2016-01-11 NOTE — Progress Notes (Signed)
   Weekly Management Note:  Outpatient    ICD-9-CM ICD-10-CM   1. Malignant neoplasm of base of tongue ([facility]) 141.0 C01      Current Dose: 43.7 Gy  Projected Dose: 46 Gy   Narrative: Ms. Vanhoozer is here w/  Daughters.   Weight stable. Diastolic bp low but in keeping with her typical vitals . Patient reports feeling light headed today, but weight stable and staying hydrated. Reports constant right mandible pain that refers to right ear managed with Tramadol. Reports she resumed Tramadol after stopping it for a few day. She expressed a fear she would become "hooked on it." Reports she is only able to drink sips of water by mouth. Reports instilling five feedings of osmolite 1.5 per day. Reports occasional nausea but, denies emesis. Reports she stopped daily Miralax due to episodes of diarrhea. Reports she didn't sleep well last night because she was up every two hours to go to the restroom to urinate. Has been wetting the bed. Denies headache or diplopia. Faint hyperpigmentation within treatment field for which patient applies sonafine bid. Reports fatigue.   Physical Findings:  Wt Readings from Last 3 Encounters:  01/11/16 134 lb 9.6 oz (61.054 kg)  01/04/16 134 lb 11.2 oz (61.1 kg)  12/30/15 133 lb 9.6 oz (60.601 kg)    weight is 134 lb 9.6 oz (61.054 kg). Her oral temperature is 97.5 F (36.4 C). Her blood pressure is 142/47 and her pulse is 84. Her respiration is 16 and oxygen saturation is 96%.   Thick oral secretions.  Tongue Mucositis. Skin erythematous, intact over right neck. No palpable neck masses.  CBC    Component Value Date/Time   WBC 9.1 12/30/2015 1150   WBC 8.1 06/09/2015   RBC 4.25 12/30/2015 1150   HGB 13.4 12/30/2015 1150   HCT 42.2 12/30/2015 1150   PLT 382 12/30/2015 1150   MCV 99.3 12/30/2015 1150   MCH 31.5 12/30/2015 1150   MCHC 31.8 12/30/2015 1150   RDW 13.8 12/30/2015 1150   LYMPHSABS 0.9 12/30/2015 1150   MONOABS 0.8 12/30/2015 1150   EOSABS 0.0  12/30/2015 1150   BASOSABS 0.0 12/30/2015 1150     CMP     Component Value Date/Time   NA 136 01/11/2016 1045   NA 139 12/30/2015 1150   NA 140 06/09/2015   K 4.6 01/11/2016 1045   K 4.1 12/30/2015 1150   CL 105 12/30/2015 1150   CO2 30* 01/11/2016 1045   CO2 26 12/30/2015 1150   GLUCOSE 165* 01/11/2016 1045   GLUCOSE 90 12/30/2015 1150   BUN 25.8 01/11/2016 1045   BUN 20 12/30/2015 1150   BUN 34* 06/09/2015   CREATININE 0.9 01/11/2016 1045   CREATININE 0.91 12/30/2015 1150   CREATININE 1.1 06/09/2015   CALCIUM 9.8 01/11/2016 1045   CALCIUM 9.3 12/30/2015 1150   PROT 6.6 01/11/2016 1045   ALBUMIN 3.0* 01/11/2016 1045   AST 38* 01/11/2016 1045   AST 16 06/09/2015   ALT 47 01/11/2016 1045   ALT 14 06/09/2015   ALKPHOS 88 01/11/2016 1045   ALKPHOS 54 06/09/2015   BILITOT 0.32 01/11/2016 1045   GFRNONAA 54* 12/30/2015 1150   GFRAA >60 12/30/2015 1150     Impression:  The patient is tolerating radiotherapy    Plan:  Continue last treatment tomorrow as planned. F/u in 2 wks, sooner if needed.   -----------------------------------  [name], MD

## 2016-01-11 NOTE — Progress Notes (Signed)
Weight stable. Diastolic bp low. Patient reports feeling light headed today. Reports constant right mandible pain that refers to right ear managed with Tramadol. Reports she resumed Tramadol after stopping it for a few day. She expressed a fear she would become "hooked on it." Reports she is only able to drink sips of water by mouth. Reports instilling five feedings of osmolite 1.5 per day. Reports occasional nausea but, denies emesis. Reports she stopped daily Miralax due to episodes of diarrhea. Reports she didn't sleep well last night because she was up every two hours to go to the restroom to urinate. Denies headache or diplopia. Faint hyperpigmentation within treatment field for which patient applies sonafine bid. Reports fatigue.   BP 142/47 mmHg  Pulse 84  Temp(Src) 97.5 F (36.4 C) (Oral)  Resp 16  Wt 134 lb 9.6 oz (61.054 kg)  SpO2 96% Wt Readings from Last 3 Encounters:  01/11/16 134 lb 9.6 oz (61.054 kg)  01/04/16 134 lb 11.2 oz (61.1 kg)  12/30/15 133 lb 9.6 oz (60.601 kg)

## 2016-01-12 ENCOUNTER — Encounter: Payer: Self-pay | Admitting: Internal Medicine

## 2016-01-12 ENCOUNTER — Ambulatory Visit: Payer: Medicare Other

## 2016-01-12 ENCOUNTER — Telehealth: Payer: Self-pay | Admitting: Adult Health

## 2016-01-12 ENCOUNTER — Ambulatory Visit
Admission: RE | Admit: 2016-01-12 | Discharge: 2016-01-12 | Disposition: A | Discharge status: Home / Self Care | Payer: Medicare Other | Source: Ambulatory Visit | Attending: Radiation Oncology | Admitting: Radiation Oncology

## 2016-01-12 ENCOUNTER — Encounter: Payer: Self-pay | Admitting: Radiation Oncology

## 2016-01-12 ENCOUNTER — Other Ambulatory Visit: Payer: Self-pay

## 2016-01-12 ENCOUNTER — Encounter: Payer: Self-pay | Admitting: *Deleted

## 2016-01-12 ENCOUNTER — Encounter: Payer: Self-pay | Admitting: Adult Health

## 2016-01-12 ENCOUNTER — Ambulatory Visit: Payer: Medicare Other | Admitting: Nutrition

## 2016-01-12 ENCOUNTER — Non-Acute Institutional Stay (SKILLED_NURSING_FACILITY): Payer: Medicare Other | Admitting: Internal Medicine

## 2016-01-12 ENCOUNTER — Other Ambulatory Visit: Payer: Self-pay | Admitting: Adult Health

## 2016-01-12 DIAGNOSIS — Z510 Encounter for antineoplastic radiation therapy: Secondary | ICD-10-CM | POA: Diagnosis not present

## 2016-01-12 DIAGNOSIS — Z931 Gastrostomy status: Secondary | ICD-10-CM | POA: Diagnosis not present

## 2016-01-12 DIAGNOSIS — C01 Malignant neoplasm of base of tongue: Secondary | ICD-10-CM | POA: Diagnosis not present

## 2016-01-12 DIAGNOSIS — Z7189 Other specified counseling: Secondary | ICD-10-CM

## 2016-01-12 LAB — PHOSPHORUS: PHOSPHORUS: 3.2 mg/dL (ref 2.5–4.5)

## 2016-01-12 NOTE — Progress Notes (Signed)
Nutrition follow-up completed with patient and her 3 daughters. Today was her final radiation therapy treatment for carcinoma to the base of the tongue. Patient is now tolerating Osmolite 1.5, 5 cans daily, with 60 cc of free water before and after each bolus feeding. Patient demonstrated bolus feeding today in the office. Labs were noted and patient does not appear to be exhibiting signs of refeeding syndrome.  Potassium, magnesium, and phosphorus all within normal limits. Weight is stable and documented as 134.6 pounds. Patient reports she does feel full after she has completed a feeding. She is drinking water throughout the day by mouth. She eats some ice cream and soup.  Estimated nutrition needs: 1750-2000 calories, 85-95 grams protein, 2 L fluid.  5 cans of Osmolite 1.5 provides 1775 cal, 75 g protein, 1505 mL free water  Nutrition diagnosis: Unintended weight loss and nutrition related knowledge deficit has improved.  Intervention: Patient educated to continue 5 cans Osmolite 1.5 daily as tolerated with 60 cc free water before and after bolus feedings. Encouraged patient to continue to drink fluids by mouth. Recommend canceling labs ordered this week to evaluate refeeding syndrome. Teach back method was used.  Monitoring, evaluation, goals: Patient will continue to tolerate tube feeding at goal rate with adequate free water flushes and water by mouth to meet estimated nutrition needs.  Next visit: Monday, April 3.

## 2016-01-12 NOTE — Progress Notes (Signed)
The following paper prescriptions were provided to the patient per her family's wishes.  They stated the [facility] facility needed these prescriptions to better be able to care for her.   -Pt may use Biotene products as needed for xerostomia.  Refills prn. -Salt water/Baking soda rinses: Mix 1/2 teaspoon salt, 1/2 tsp baking soda, & 16 oz water. Swish & spit as needed. Refills prn.   Encouraged family to let us know if they needed any additional orders/prescriptions.    Mike Craze, NP [place] [phone]

## 2016-01-12 NOTE — Progress Notes (Signed)
The following paper prescriptions were provided to the patient per her family's wishes.  They stated the [facility] facility needed these prescriptions to better be able to care for her.   -Pt may use Biotene products as needed for xerostomia.  Refills prn. -Salt water/Baking soda rinses: Mix 1/2 teaspoon salt, 1/2 tsp baking soda, & 16 oz water. Swish & spit as needed. Refills prn.   Encouraged family to let us know if they needed any additional orders/prescriptions.    Mike Craze, NP [place] [phone]

## 2016-01-12 NOTE — Telephone Encounter (Signed)
I received a call from one of the RN's in the rehab portion of [facility].  She would like clarification on how their staff can best support Ms. Cork pain mgmt with the Tramadol.  She is currently being offered the dose in the morning, but declines other doses throughout the day.  She apparently declined all doses over the weekend and "did not have a good day" per both her daughter's and RN's report.    I let the RN know that Ms. Ruman has been very clear from the beginning that she wants treatment to help control her symptoms.  I stressed the importance of pain control in achieving those goals after completing radiation therapy, with which the RN agrees.  I asked that the staff offer the patient an additional evening or bedtime dose of the Tramadol for the next several weeks as she continues to heal.  I let the nurse know that we have talked with Ms. Scotto on several occasions about the importance of optimal pain control when recovering from cancer treatments.    I think having the patient take Tramadol twice daily is completely reasonable, with the flexibility for her to get more doses if needed.  I let the nurse know that I will defer to their nursing judgment, as well as their physician's judgment on best strategies to manage her pain while she is in their care.  I encouraged the RN to call me with any other questions or concerns and I would be happy to help facilitate any necessary orders/needs they may have in order to care for Ms. Worley.  I thanked the nurse for calling me and encouraged her to call if their staff have more ideas on how we can collaborate to care for this patient.    For future orders, the nurse tells me that either a phone call directly to the Rehab center to give a verbal order to the RN on duty and/or fax orders.  Below is the contact information:   -[facility] RN: [phone] (phone carried by RN at all times) -[facility]  fax: [place], NP [place] [phone]

## 2016-01-12 NOTE — Progress Notes (Signed)
I was present today to celebrate with [name] and her family as she "rang the bell" after her final radiation therapy appt.  She has her follow-up appts with [facility] & Dr. Isidore Moos. I am happy to see her, as needed, in the future as she continues to heal from radiation therapy.   Mike Craze, NP [place] [phone]

## 2016-01-12 NOTE — Progress Notes (Signed)
Patient ID: [name], female   DOB: July 13, 1924, 82 y.o.   MRN: [id number]  Location:  [facility] Room Number: 146 Place of Service:  SNF (31) Provider:   Hollace Kinnier, DO  Patient Care Team: Gayland Curry, DO as PCP - General (Geriatric Medicine) [facility] Deneise Lever, MD as Consulting Physician (Pulmonary Disease) Jerrell Belfast, MD as Consulting Physician (Otolaryngology) Rolm Bookbinder, MD as Consulting Physician (Dermatology) [name], MD as Attending Physician (Radiation Oncology) Holley Bouche, NP as Nurse Practitioner (Nurse Practitioner)  Extended Emergency Contact Information Primary Emergency Contact: San Jet[place] of [place] Phone: [phone] Relation: Other Secondary Emergency Contact: Florea,[name]          [place], [place] Phone: [phone] Relation: Daughter  Code Status:  DNR Goals of care: Advanced Directive information Advanced Directives 01/12/2016  Does patient have an advance directive? Yes  Type of Paramedic of [place];Living will;Out of facility DNR (pink MOST or yellow form)  Does patient want to make changes to advanced directive? -  Copy of advanced directive(s) in chart? Yes  Pre-existing out of facility DNR order (yellow form or pink MOST form) Yellow form placed in chart (order not valid for inpatient use)     Chief Complaint  Patient presents with  . Acute Visit    HPI:  Pt is a 82 y.o. 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  Recently, she's had PEG tube placed to help with her nutritional status during radiation therapy.  Unfortunately, she had already lost significant weight.   Last week, she had told me she was having her final palliative XRT.  She and her daughter both understand this is not curative therapy, but meant to help improve her ability to chew, swallow and talk, but prolonging the therapy could worsen all of those things.  She says she was hoping to live to be 89 b/c anybody could live to be 89, but now realizes this is not going to happen; however, she's not ready to go just yet.    MOST completed as follows:  Comfort measures, determine use of IVF and abx as situations arise depending on how she is doing at that time, already has PEG tube and was not ready to discuss potential duration of this at this point.  Do not hospitalize.  DNR.  All three of her daughters were here for this discussion.   Past Medical History  Diagnosis Date  . Nasal polyps   . Osteoporosis   . Mitral valve disorders   . Chronic rhinitis   . Unspecified urinary incontinence   . Asthma   . Hyperlipidemia   . Hypertension   . Urge incontinence   . Unspecified hypothyroidism   . Bilateral claudication of lower limb ([place]) 08/29/2013    Bilateral posterior thighs  . Aortic atherosclerosis ([place]) 08/29/2013    Identified on Aortic ultrasound exam 2008   . Bullous pemphigoid 02/2015    Dr. Wilhemina Bonito  . Arthritis    Past Surgical History  Procedure Laterality Date  . Nasal polyp surgery  03/2002    x2 Dr Wilburn Cornelia  . Tonsillectomy    . Total abdominal hysterectomy w/ bilateral salpingoophorectomy  1970's  . Bunionectomy Left 12/1998  .  Milk cyst right breast    . Breast lumpectomy Right 1953  . Bunionectomy Right 02/2000  . Eye surgery Bilateral 02/2000    Cataract removal  . Root canal  02/2014  . Direct laryngoscopy N/A 10/30/2015    Procedure: DIRECT LARYNGOSCOPY AND BIOPSY OF TONGUE MASS;  Surgeon: Jerrell Belfast, MD;  Location: [facility];  Service: ENT;  Laterality: N/A;    Allergies  Allergen Reactions  . Sulfa Antibiotics   . Ampicillin Swelling  . Chlorine   .  Codeine   . Macrobid [Nitrofurantoin Monohydrate Macrocrystals]   . Statins   . Trimethoprim       Medication List       This list is accurate as of: 01/12/16 10:52 AM.  Always use your most recent med list.               acetaminophen 500 MG tablet  Commonly known as:  TYLENOL  Take 1 tablet (500 mg total) by mouth every 6 (six) hours as needed for mild pain. May take 1-2 tablets. Do not exceed 4000 mg per day.     cholecalciferol 1000 units tablet  Commonly known as:  VITAMIN D  Take 2,000 Units by mouth daily.     feeding supplement (OSMOLITE 1.5 CAL) Liqd  Begin 120 cc Osmolite 1.5 QID with 60 cc free water before and after each feeding. Day 3, increase to 180 cc Osmolite 1.5 as tolerated. Day 5 increase to 240 cc Osmolite 1.5 as tolerated. Day 7, increase to 240 cc Osmolite 1.5 5 times daily. Flush or drink an additional 240 cc free water daily. Please send feedings and supplies.     levothyroxine 75 MCG tablet  Commonly known as:  SYNTHROID, LEVOTHROID  TAKE 1 TABLET BY MOUTH EVERY MORNING     polyethylene glycol packet  Commonly known as:  MIRALAX / GLYCOLAX  17 g by PEG Tube route daily. Flush with 30cc's before and after administering miralax     predniSONE 20 MG tablet  Commonly known as:  DELTASONE  Take 20 mg by mouth daily with breakfast.     senna 8.6 MG Tabs tablet  Commonly known as:  SENOKOT  Take 2 tablets (17.2 mg total) by mouth at bedtime. May take every other day if stools become loose.     traMADol 50 MG tablet  Commonly known as:  ULTRAM  75 mg by PEG Tube route every 6 (six) hours as needed (pain).     vitamin C 1000 MG tablet  Take 1,000 mg by mouth daily.        Review of Systems  Constitutional: Positive for appetite change, fatigue and unexpected weight change. Negative for fever and chills.  HENT: Negative for congestion.   Eyes: Negative for visual disturbance.  Respiratory: Negative for cough, chest tightness, shortness of breath  and wheezing.   Cardiovascular: Negative for chest pain and leg swelling.  Gastrointestinal: Positive for abdominal pain and constipation. Negative for nausea, vomiting and diarrhea.  Genitourinary: Negative for dysuria, urgency and frequency.  Musculoskeletal: Negative for back pain, arthralgias and gait problem.       Was walking well in hall with walker today  Skin: Negative for color change.  Neurological: Positive for weakness. Negative for dizziness.  Hematological: Bruises/bleeds easily.  Psychiatric/Behavioral: Negative for confusion, sleep disturbance and agitation.    Immunization History  Administered Date(s) Administered  . Influenza Split 07/25/2011, 07/24/2012  . Influenza Whole 07/24/2010, 08/16/2013  . Influenza-Unspecified 08/11/2014  .  Pneumococcal Conjugate-13 11/18/2014  . Pneumococcal Polysaccharide-23 01/17/2004   Pertinent  Health Maintenance Due  Topic Date Due  . INFLUENZA VACCINE  05/25/2015  . DEXA SCAN  Completed  . PNA vac Low Risk Adult  Completed   Fall Risk  12/04/2015 06/17/2015 05/07/2014 01/16/2013  Falls in the past year? No No No No   Functional Status Survey:    Filed Vitals:   01/12/16 1030  BP: 133/62  Pulse: 82  Temp: 97.3 F (36.3 C)  TempSrc: Oral  Resp: 20  Height: 5\' 2"  (1.575 m)  Weight: 134 lb (60.782 kg)  SpO2: 99%   Body mass index is 24.5 kg/(m^2). Physical Exam  Constitutional: She is oriented to person, place, and time. She appears well-developed and well-nourished. No distress.  Cardiovascular: Normal rate, regular rhythm, normal heart sounds and intact distal pulses.   Pulmonary/Chest: Effort normal and breath sounds normal. No respiratory distress. She has no wheezes. She has no rales.  Abdominal: Soft. Bowel sounds are normal. She exhibits no distension and no mass. There is tenderness.  A bit tender around PEG site, no significant drainage.  Musculoskeletal: Normal range of motion.  Walking with walker    Neurological: She is alert and oriented to person, place, and time.  Skin: Skin is warm and dry. No erythema.  Psychiatric:  Very pleasant and appropriate    Labs reviewed:  Recent Labs  10/23/15 0940  12/30/15 1150 12/31/15 1442 12/31/15 1443 01/07/16 1037 01/11/16 1045 01/11/16 1045  NA 141  --  139  --  135* 135* 136  --   K 4.3  --  4.1  --  4.6 4.3 4.6  --   CL 105  --  105  --   --   --   --   --   CO2 27  --  26  --  25 28 30*  --   GLUCOSE 94  --  90  --  146* 123 165*  --   BUN 17  < > 20  --  30.1* 24.2 25.8  --   CREATININE 0.93  < > 0.91  --  1.3* 0.9 0.9  --   CALCIUM 9.5  --  9.3  --  10.0 9.6 9.8  --   MG  --   --   --   --  1.8 1.8 2.2  --   PHOS  --   --   --  4.0  --  3.1  --  3.2  < > = values in this interval not displayed.  Recent Labs  12/31/15 1443 01/07/16 1037 01/11/16 1045  AST 16 22 38*  ALT 10 24 47  ALKPHOS 66 67 88  BILITOT 0.86 0.38 0.32  PROT 7.4 6.4 6.6  ALBUMIN 3.7 2.9* 3.0*    Recent Labs  06/09/15 10/23/15 0940 12/30/15 1150  WBC 8.1 9.9 9.1  NEUTROABS  --   --  7.4  HGB 13.4 12.8 13.4  HCT 40 40.7 42.2  MCV  --  99.5 99.3  PLT 378 390 382   Lab Results  Component Value Date   TSH 2.22 02/20/2014   No results found for: HGBA1C Lab Results  Component Value Date   CHOL 188 06/09/2015   HDL 90* 06/09/2015   LDLCALC 86 06/09/2015   TRIG 85 06/09/2015    Significant Diagnostic Results in last 30 days:  Ir Gastrostomy Tube  12/30/2015  INDICATION: History of mucoid epidermoid carcinoma of the oropharynx.  Please perform per gastrostomy tube placement for enteric nutrition supplementation as the patient is undergoing radiation therapy. EXAM: PULL TROUGH GASTROSTOMY TUBE PLACEMENT COMPARISON:  PET-CT - 11/13/2015 MEDICATIONS: Vancomycin 1 gm IV; Antibiotics were administered within 1 hour of the procedure. Glucagon 1 mg IV CONTRAST:  20 mL of Omnipaque 300 administered into the gastric lumen. ANESTHESIA/SEDATION: Moderate  (conscious) sedation was employed during this procedure. A total of Versed 2 mg and Fentanyl 100 mcg was administered intravenously. Moderate Sedation Time: 23 minutes. The patient's level of consciousness and vital signs were monitored continuously by radiology nursing throughout the procedure under my direct supervision. FLUOROSCOPY TIME:  2 minutes 18 seconds (19 mGy) COMPLICATIONS: None immediate. PROCEDURE: Informed written consent was obtained from the patient following explanation of the procedure, risks, benefits and alternatives. A time out was performed prior to the initiation of the procedure. Ultrasound scanning was performed to demarcate the edge of the left lobe of the liver. Maximal barrier sterile technique utilized including caps, mask, sterile gowns, sterile gloves, large sterile drape, hand hygiene and Betadine prep. The left upper quadrant was sterilely prepped and draped. An oral gastric catheter was inserted into the stomach under fluoroscopy. The existing nasogastric feeding tube was removed. The left costal margin and air opacified transverse colon were identified and avoided. Air was injected into the stomach for insufflation and visualization under fluoroscopy. Under sterile conditions a 17 gauge trocar needle was utilized to access the stomach percutaneously beneath the left subcostal margin after the overlying soft tissues were anesthetized with 1% Lidocaine with epinephrine. Needle position was confirmed within the stomach with aspiration of air and injection of small amount of contrast. A single T tack was deployed for gastropexy. Over an Amplatz guide wire, a 9-French sheath was inserted into the stomach. A snare device was utilized to capture the oral gastric catheter. The snare device was pulled retrograde from the stomach up the esophagus and out the oropharynx. The 20-French pull-through gastrostomy was connected to the snare device and pulled antegrade through the oropharynx down  the esophagus into the stomach and then through the percutaneous tract external to the patient. The gastrostomy was assembled externally. Contrast injection confirms position in the stomach. Several spot radiographic images were obtained in various obliquities for documentation. The patient tolerated procedure well without immediate post procedural complication. FINDINGS: After successful fluoroscopic guided placement, the gastrostomy tube is appropriately positioned with internal disc against the ventral aspect of the gastric lumen. IMPRESSION: Successful fluoroscopic insertion of a 20-French pull-through gastrostomy tube. The gastrostomy may be used immediately for medication administration and in 24 hrs for the initiation of feeds. Electronically Signed   By: Sandi Mariscal M.D.   On: 12/30/2015 16:47    Assessment/Plan 1. Advance care planning -see above  2. PEG (percutaneous endoscopic gastrostomy) status ([place]) -in place and providing majority of her nutrition at this time -she is also now getting trays, but intake minimal of liquids and other foods  3. Malignant neoplasm of base of tongue ([facility]) -s/p palliative XRT -has helped to shrink tumor significantly and adenopathy and pt gradually getting some appetite and improving ability to swallow again -she would like to be able to return to her home, but is not strong enough yet, walking with walker pretty well now  Family/ staff Communication: approximately 1 hour was spent with patient completing MOST form, discussing goals of care and answering her questions and her daughter's questions; an additional 15 mins were spent reviewing records and focused on  med mgt  Labs/tests ordered:  No new at present

## 2016-01-12 NOTE — Progress Notes (Signed)
  Oncology Nurse Navigator Documentation  Navigator Location: [facility] On[facility] (01/12/16 1115) Navigator Encounter Type: Treatment (01/12/16 1115)             Treatment Phase: Final Radiation Tx (01/12/16 1115)       Met with Ms. Hazelip during final RT to offer support and to celebrate end of radiation treatment.  She was  accompanied by her 3 dtrs.  Dtr [name] is here from [place], is staying to support her mother through the end of this month.  Dtr [name] is returning to [place], [place]. I provided post-RT guidance:  Importance of keeping follow-up appts with Nutrition and SLP.  Importance of protecting treatment area from sun.  Continuation of Sonafine application 2-3 times daily. I explained that my role as navigator will continue for several more months and that I will be calling and/or joining her during follow-up visits.   I provided multiple copies of Epic appt calendar for appts scheduled through April. She and dtrs voiced understanding I will arrange monthly follow-up appts (after April 3rd appts) with Glendell Docker and [facility] for upcoming H&N MDCs. I encouraged her or dtrs to call me or [place]ret[place] with needs/concerns.   Patient and dtrs verbalized understanding of information provided.  Gayleen Orem, RN, BSN, [facility] [place] [phone]                          Time Spent with Patient: 45 (01/12/16 1115)

## 2016-01-15 ENCOUNTER — Encounter: Payer: Self-pay | Admitting: Radiation Oncology

## 2016-01-15 NOTE — Progress Notes (Signed)
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 Base of tongue and upper neck  / 46 Gy in 20 fractions                        2. Right lower neck / 40 Gy in 20 fractions                         BEAMS/ENERGY:  1. 3-D Conformal / 6X, 10X                                  2. 3-D Conformal / 6X, 10X    NARRATIVE: The patient tolerated radiation treatment relatively well.Patient experienced mild fatigue and  mouth pain managed with Tramadol.     PEG tube was placed due to poor PO intake.                      PLAN: Routine followup in one month. Patient instructed to call if questions or worsening complaints in interim.   -----------------------------------  [name], MD    This document serves as a record of services personally performed by [name], MD. It was created on her behalf by Arlyce Harman, a trained medical scribe. The creation of this record is based on the scribe's personal observations and the provider's statements to them. This document has been checked and approved by the attending provider.

## 2016-01-18 NOTE — Progress Notes (Signed)
[name] presents for follow up of radiation completed 01/12/16 to her Base of tongue and RIght lower neck.     Pain issues, if any: No, but she is taking ?75mg  of tramadol at 8 am and 8pm. She will take tylenol in the afternoon at times. Using a feeding tube?: Yes, she is taking Osmolite 5 cans daily via her PEG tube. She is also taking about 1000cc's water daily orally and through her PEG tube.  Weight changes, if any:  Wt Readings from Last 3 Encounters:  01/25/16 135 lb 11.2 oz (61.553 kg)  01/12/16 134 lb 9.6 oz (61.054 kg)  01/12/16 134 lb (60.782 kg)   Swallowing issues, if any: She reports some trouble swallowing related to pressure on the right side. She is only taking in liquids orally. She reports trying jello yesterday, but she was not able to taste the food well and couldn't eat the jello. She is using the baking soda rinse alternating with biotene for thick saliva and dry mouth. She reports improvement in the thick saliva she was experiencing.  Smoking or chewing tobacco? No Using fluoride trays daily? Not at this time. She plans to try it soon (as it used to burn). Last ENT visit was on: Not since diagnosis. Other notable issues, if any: She is not sleeping well at night. She is up during the night to use the bathroom, and at other times, just wakes up.

## 2016-01-20 ENCOUNTER — Telehealth: Payer: Self-pay | Admitting: *Deleted

## 2016-01-20 ENCOUNTER — Telehealth: Payer: Self-pay | Admitting: Adult Health

## 2016-01-20 NOTE — Telephone Encounter (Signed)
I received a call from the speech therapist at [facility] regarding [name]'s inability to participate in therapy due to right jaw pain, right ear pain, and anxiety.  When asked if the patient was taking her pain medications (Tramadol), the therapist tells me that she is consistently taking the scheduled morning dose of the Tramadol, but is not really taking the prn doses.  She also does not like to use the Lidocaine because she doesn't like her mouth to feel numb.   I let the speech therapist know that given that [name] just completed treatment 1 week ago, a lot of these side effects are to be expected and we should try to optimize her pain control with offering additional Tramadol doses (particularly offering 1 dose in the evening as discussed with [facility] nurse last week) would be helpful.  I asked if the facility has access to [place] and the therapist stated that she has already asked that this be ordered; she stated that she thinks their formulation of the mouthwash comes with Nystatin, Maalox, and Lidocaine.  I let the therapist know that Magic Mouthwash +/- the Nystatin or Benadryl, but being sure to include the lidocaine may offer [name] some benefit, even though she doesn't like the numb feeling.  The therapist asked me about using something like Zilactin for her oral pain.  I let her know that I was less familiar with that medication, but looked it up and the active ingredient is benzoyl alcohol which would likely be very painful for someone with mucositis related to radiation therapy.    At this point, the speech therapist is trying to just help keep the patient's mouth clean and is less focused on swallowing exercises.  I reinforced that she will likely be ready to be more involved in her therapy in the coming weeks as she continues to heal from radiation therapy.  I encouraged them to continue to use the salt water/baking soda rinses, which  she tells me the patient is doing consistently.  Long term plans will include the patient being more active in speech therapy in order to safely have her feeding tube removed.  This may provide some additional motivation for the patient to participate in speech therapy after she has recovered from the more acute side effects of treatment.    The speech therapist voiced understanding and agrees with this plan.  I encouraged her to call me with any other questions or concerns; I also encouraged her to have the staff call me if they needed any additional orders in order to better care for [name].    Dr. Isidore Moos will see the patient for routine follow-up on 01/25/16 and [facility] is aware of this appt.  I will continue to follow this patient to provide support, as needed.   Mike Craze, NP [place] [phone]

## 2016-01-21 NOTE — Telephone Encounter (Addendum)
  Oncology Nurse Navigator Documentation  Navigator Location: [facility] (01/20/16 1548) Navigator Encounter Type: Telephone (01/20/16 1548) Telephone: Incoming Call (01/20/16 1548)           Treatment Phase: Post-Tx Follow-up (01/20/16 1548) Barriers/Navigation Needs: Coordination of Care (01/20/16 1548)       Received call from Ms Slider's dtr Vaughan Basta.    She called to ask if it was necessary  for her mother to see Garald Balding, SLP, again so he can release/discharge her now that she is receiving SLP services by [facility] Jeani Hawking.  I indicated that since [place] and Glendell Docker have been in conversation, it was likely not necessary.  I later confirmed with Glendell Docker this was accurate, that he has released Ms. Whipp.  I communicated this to [place].  She also reported that her mother is again not taking tramadol as directed and as a result experiences significant throat discomfort.  She indicated she reiterated the importance of taking as directed to maintain comfort.  Gayleen Orem, RN, BSN, [place] [place] [phone]                       Time Spent with Patient: 30 (01/20/16 1548)

## 2016-01-25 ENCOUNTER — Encounter: Payer: Self-pay | Admitting: Radiation Oncology

## 2016-01-25 ENCOUNTER — Ambulatory Visit: Payer: Medicare Other

## 2016-01-25 ENCOUNTER — Encounter: Payer: Self-pay | Admitting: *Deleted

## 2016-01-25 ENCOUNTER — Ambulatory Visit: Payer: Self-pay

## 2016-01-25 ENCOUNTER — Ambulatory Visit
Admission: RE | Admit: 2016-01-25 | Discharge: 2016-01-25 | Disposition: A | Discharge status: Home / Self Care | Payer: Medicare Other | Source: Ambulatory Visit | Attending: Radiation Oncology | Admitting: Radiation Oncology

## 2016-01-25 ENCOUNTER — Ambulatory Visit: Payer: Medicare Other | Admitting: Nutrition

## 2016-01-25 VITALS — BP 139/59 | HR 83 | Temp 98.0°F | Ht 62.0 in | Wt 135.7 lb

## 2016-01-25 DIAGNOSIS — R634 Abnormal weight loss: Secondary | ICD-10-CM

## 2016-01-25 DIAGNOSIS — C01 Malignant neoplasm of base of tongue: Secondary | ICD-10-CM

## 2016-01-25 MED ORDER — LIDOCAINE VISCOUS 2 % MT SOLN: OROMUCOSAL | Status: DC

## 2016-01-25 NOTE — Progress Notes (Signed)
Nutrition follow-up completed with patient and daughter. Tube feedings continue to go well and patient tolerating goal rate of Osmolite 1.5, 5 cans daily, with 60 cc of free water before and after bolus feedings. Patient continues to administer bolus feedings on her own. Weight improved and documented as 135.7 pounds April 3 increased from 134.6 pounds. Patient continues to drink water throughout the day. She is unable to eat foods at this time secondary to taste alterations and painful swallowing.  Nutrition diagnosis: Unintended weight loss improved and nutrition related knowledge deficit improved.  Intervention:  Patient educated to continue 5 cans Osmolite 1.5 daily to meet minimum estimated nutrition needs to support weight and healing. Patient encouraged to continue to drink water by mouth. Educated patient about trying small bites and sips of food at mealtimes but not to reduce tube feeding until she was able to consume at least 350 cal at a time. Teach back method used.  Monitoring, evaluation, goals: Patient will continue to tolerate tube feeding at goal rate to promote weight gain.  Next visit: To be scheduled.  **Disclaimer: This note was dictated with voice recognition software. Similar sounding words can inadvertently be transcribed and this note may contain transcription errors which may not have been corrected upon publication of note.**

## 2016-01-25 NOTE — Progress Notes (Signed)
Radiation Oncology         [phone]) [phone] ________________________________  Name: [name] MRN: [id number]  Date: 01/25/2016  DOB: 1924/04/11  Follow-Up Visit Note  CC: [name], [name], DO  [name], [name] L, DO  Diagnosis and Prior Radiotherapy:       ICD-9-CM ICD-10-CM   1. Malignant neoplasm of base of tongue (HCC) 141.0 C01 lidocaine (XYLOCAINE) 2 % solution     NM PET Image Restag (PS) Skull Base To Thigh     TSH  2. 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 Base of tongue and upper neck  / 46 Gy in 20 fractions                        2. Right lower neck / 40 Gy in 20 fractions                         Narrative:  The patient returns today for routine follow-up.  Doing well.  Maintained weight.  PO intake mainly limited to water as food tastes bad.  Attempts soups and pie, but poor taste limits intact severely.  Still in rehab at [facility].  Pain controlled.              Inquires about MTX vs steroid for bullous pemphigoid.  Sleep affected by disruptions by staff at night to get her to go to the toilet. But she is now able to wake on her own to urinate.        ALLERGIES:  is allergic to sulfa antibiotics; ampicillin; chlorine; codeine; macrobid; statins; and trimethoprim.  Meds: Current Outpatient Prescriptions  Medication Sig Dispense Refill  . acetaminophen (TYLENOL) 500 MG tablet Take 1 tablet (500 mg total) by mouth every 6 (six) hours as needed for mild pain. May take 1-2 tablets. Do not exceed 4000 mg per day. 120 tablet 5  . Ascorbic Acid (VITAMIN C) 1000 MG tablet Take 1,000 mg by mouth daily.    . cholecalciferol (VITAMIN D) 1000 UNITS tablet Take 2,000 Units by mouth daily.    Marland Kitchen levothyroxine (SYNTHROID, LEVOTHROID) 75 MCG  tablet TAKE 1 TABLET BY MOUTH EVERY MORNING 90 tablet 1  . Nutritional Supplements (FEEDING SUPPLEMENT, OSMOLITE 1.5 CAL,) LIQD Begin 120 cc Osmolite 1.5 QID with 60 cc free water before and after each feeding. Day 3, increase to 180 cc Osmolite 1.5 as tolerated. Day 5 increase to 240 cc Osmolite 1.5 as tolerated. Day 7, increase to 240 cc Osmolite 1.5 5 times daily. Flush or drink an additional 240 cc free water daily. Please send feedings and supplies. 5 Bottle 0  . polyethylene glycol (MIRALAX / GLYCOLAX) packet 17 g by PEG Tube route daily. Flush with 30cc's before and after administering miralax    . predniSONE (DELTASONE) 20 MG tablet Take 20 mg by mouth daily with breakfast.    . senna (SENOKOT) 8.6 MG TABS tablet Take 2 tablets (17.2 mg total) by mouth at bedtime. May take every other day if stools become loose. 60 each 5  . traMADol (ULTRAM) 50 MG  tablet 75 mg by PEG Tube route every 6 (six) hours as needed (pain).    Marland Kitchen lidocaine (XYLOCAINE) 2 % solution Caregiver:Mix 1part 2% viscous lidocaine, 1part H20. Swish and/or swallow 59mL of this mixture, 49min before meals and at bedtime, up to QID 100 mL 5   No current facility-administered medications for this encounter.    Physical Findings: The patient is in no acute distress. Patient is alert and oriented. Wt Readings from Last 3 Encounters:  01/25/16 135 lb 11.2 oz (61.553 kg)  01/12/16 134 lb 9.6 oz (61.054 kg)  01/12/16 134 lb (60.782 kg)    height is 5\' 2"  (1.575 m) and weight is 135 lb 11.2 oz (61.553 kg). Her temperature is 98 F (36.7 C). Her blood pressure is 139/59 and her pulse is 83. .  General: Alert and oriented, in no acute distress; in WC HEENT: Head is normocephalic. Marland Kitchen Oropharynx is notable for fullness in right posterior tongue, no obvious tumor. Resolving mucositis Neck: Neck is notable for no palpable lumps Skin: Skin in treatment fields shows satisfactory healing with residual hyperpigmentation Psychiatric:  Judgment and insight are intact. Affect is appropriate.   Lab Findings: Lab Results  Component Value Date   WBC 9.1 12/30/2015   HGB 13.4 12/30/2015   HCT 42.2 12/30/2015   MCV 99.3 12/30/2015   PLT 382 12/30/2015    Lab Results  Component Value Date   TSH 2.22 02/20/2014    Radiographic Findings: Ir Gastrostomy Tube  12/30/2015  INDICATION: History of mucoid epidermoid carcinoma of the oropharynx. Please perform per gastrostomy tube placement for enteric nutrition supplementation as the patient is undergoing radiation therapy. EXAM: PULL TROUGH GASTROSTOMY TUBE PLACEMENT COMPARISON:  PET-CT - 11/13/2015 MEDICATIONS: Vancomycin 1 gm IV; Antibiotics were administered within 1 hour of the procedure. Glucagon 1 mg IV CONTRAST:  20 mL of Omnipaque 300 administered into the gastric lumen. ANESTHESIA/SEDATION: Moderate (conscious) sedation was employed during this procedure. A total of Versed 2 mg and Fentanyl 100 mcg was administered intravenously. Moderate Sedation Time: 23 minutes. The patient's level of consciousness and vital signs were monitored continuously by radiology nursing throughout the procedure under my direct supervision. FLUOROSCOPY TIME:  2 minutes 18 seconds (19 mGy) COMPLICATIONS: None immediate. PROCEDURE: Informed written consent was obtained from the patient following explanation of the procedure, risks, benefits and alternatives. A time out was performed prior to the initiation of the procedure. Ultrasound scanning was performed to demarcate the edge of the left lobe of the liver. Maximal barrier sterile technique utilized including caps, mask, sterile gowns, sterile gloves, large sterile drape, hand hygiene and Betadine prep. The left upper quadrant was sterilely prepped and draped. An oral gastric catheter was inserted into the stomach under fluoroscopy. The existing nasogastric feeding tube was removed. The left costal margin and air opacified transverse colon were identified  and avoided. Air was injected into the stomach for insufflation and visualization under fluoroscopy. Under sterile conditions a 17 gauge trocar needle was utilized to access the stomach percutaneously beneath the left subcostal margin after the overlying soft tissues were anesthetized with 1% Lidocaine with epinephrine. Needle position was confirmed within the stomach with aspiration of air and injection of small amount of contrast. A single T tack was deployed for gastropexy. Over an Amplatz guide wire, a 9-French sheath was inserted into the stomach. A snare device was utilized to capture the oral gastric catheter. The snare device was pulled retrograde from the stomach up the esophagus and out the  oropharynx. The 20-French pull-through gastrostomy was connected to the snare device and pulled antegrade through the oropharynx down the esophagus into the stomach and then through the percutaneous tract external to the patient. The gastrostomy was assembled externally. Contrast injection confirms position in the stomach. Several spot radiographic images were obtained in various obliquities for documentation. The patient tolerated procedure well without immediate post procedural complication. FINDINGS: After successful fluoroscopic guided placement, the gastrostomy tube is appropriately positioned with internal disc against the ventral aspect of the gastric lumen. IMPRESSION: Successful fluoroscopic insertion of a 20-French pull-through gastrostomy tube. The gastrostomy may be used immediately for medication administration and in 24 hrs for the initiation of feeds. Electronically Signed   By: Sandi Mariscal M.D.   On: 12/30/2015 16:47    Impression/Plan:    1) Head and Neck Cancer Status: healing from RT w/ clinical response  2) Nutritional Status: stable weight PEG tube: reliant on this totally for calories Encouraged to experiment with different foods to practice swallowing  3) Swallowing: see number 2  4)  Dental: Encouraged to continue regular followup with dentistry, and dental hygiene    5) Thyroid function:  Recheck in 2-3 mo Lab Results  Component Value Date   TSH 2.22 02/20/2014    6) Other: discuss bullous pemphigoid meds with dermatology on Friday Advised to Ask staff at [facility] to let her continue to sleep and get up on her own to urinate  7) Follow-up in end of June with restating PET.  See nutrition today and survivorship and nutrition  in 4wks.. The patient was encouraged to call with any issues or questions before then.  _____________________________________   [name], MD

## 2016-01-26 ENCOUNTER — Telehealth: Payer: Self-pay | Admitting: Radiation Oncology

## 2016-01-26 ENCOUNTER — Telehealth: Payer: Self-pay | Admitting: *Deleted

## 2016-01-26 NOTE — Progress Notes (Signed)
  Oncology Nurse Navigator Documentation  Navigator Location: [facility] Onc (01/25/16 1305) Navigator Encounter Type: Follow-up Appt (01/25/16 1305)   Abnormal Finding Date: 10/02/15 (01/25/16 1305) Confirmed Diagnosis Date: 10/30/15 (01/25/16 1305)   Treatment Initiated Date: 12/17/15 (01/25/16 1305) Patient Visit Type: Follow-up;Post-XRT (01/25/16 1305) Treatment Phase: Post-Tx Follow-up (01/25/16 1305) Barriers/Navigation Needs: No barriers at this time (01/25/16 1305)       [name] arrives today with her dtr [name] for post-XRT follow-up.  She completed tmt 01/12/16.  She reports:  "Feeling much better."  We discussed that she can expect continued improvement as she moves further out from tmt.    Continues to reside in [facility] (private room).  Determination to return to her home, doing everything she can to reach that goal.  Using a walker to assist with ambulation, occasionally without.  Instilling 5 cans Osmolite daily with water flushes before and after feedings.  Trying oral intake - eg jello - but unable to taste.  I re-educated that sense of taste will gradually return, encouraged her to experiment with different foods for palatability importance of oral intake to maintain swallowing function.  We discussed that she must demonstrate 100% oral intake with weight stability before PEG can be removed.  We discussed importance of keeping follow-up appts with Nutrition and SLP.  Has not used fluoride trays since arriving to Rehab several weeks ago.  I encouraged her to resume, re-educated on importance of daily use to prevent future dental caries.  Thickened saliva is beginning to resolve.  Compliant with OT and PT therapies.  She notes that she will be visiting her home with OT this week to assess needs prior to her permanent return.  Feels some pressure on R side of neck when swallowing but denies pain.  Using lidocaine PRN, tramadol BID, PRN Tylenol.  We  discussed the importance of taking these medications to maintain pain control, that she will realize she can begin to taper their use as she moves forward from final tmt.  Not sleeping through HS, awakened by staff frequently to use bathroom and for routine checks.  She was encouraged to request reduced wake-ups.   She voiced understanding that she will see Mike Craze, NP Survivorship in 4 months, that this appt will be coordinated with a Nutrition follow-up.  She further noted she will be attending the H&N Recognition on 02/08/16 and H&N FYNN series that begins 02/16/16.  She understands I can be contacted with needs/concerns.  Gayleen Orem, RN, BSN, [place] at [place] [phone]                         Time Spent with Patient: 30 (01/25/16 1305)

## 2016-01-26 NOTE — Telephone Encounter (Signed)
Confirmed upcoming appointment and will send a copy of upcoming appointments to patient

## 2016-01-26 NOTE — Telephone Encounter (Signed)
CALLED PATIENT TO INFORM OF PET, LAB AND FU, LVM FOR A RETURN CALL

## 2016-01-28 ENCOUNTER — Telehealth: Payer: Self-pay | Admitting: Adult Health

## 2016-01-28 NOTE — Telephone Encounter (Signed)
I attempted to reach Ms. Hutchins to make her aware that Dr. Isidore Moos would me to see her and Dory Peru, RD in early May and I wanted to let her know about those appts.  I left a voicemail letting Ms. Arave know that I have made these appts and mailed a letter with an appt calendar to her home.  I encouraged her to call me with any questions or concerns.  I look forward to seeing her soon!   Mike Craze, NP [place] [phone]

## 2016-02-05 ENCOUNTER — Ambulatory Visit
Admission: RE | Admit: 2016-02-05 | Discharge: 2016-02-05 | Disposition: A | Discharge status: Home / Self Care | Payer: Medicare Other | Source: Ambulatory Visit | Attending: Radiation Oncology | Admitting: Radiation Oncology

## 2016-02-05 ENCOUNTER — Ambulatory Visit: Payer: Self-pay | Admitting: Radiation Oncology

## 2016-02-05 ENCOUNTER — Ambulatory Visit: Admission: RE | Admit: 2016-02-05 | Payer: Medicare Other | Source: Ambulatory Visit | Admitting: Radiation Oncology

## 2016-02-05 NOTE — Progress Notes (Incomplete)
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    Plan: The patient has completed radiation treatment. The patient will return to radiation oncology clinic for routine followup in one month. I advised them to call or return sooner if they have any questions or concerns related to their recovery or treatment.  -----------------------------------  [name], MD  This document serves as a record of services personally performed by [name], MD. It was created on her behalf by Derek Mound, a trained medical scribe. The creation of this record is based on the scribe's personal observations and the provider's statements to them. This document has been checked and approved by the attending provider.

## 2016-02-06 ENCOUNTER — Encounter: Payer: Self-pay | Admitting: Internal Medicine

## 2016-02-15 ENCOUNTER — Encounter (HOSPITAL_COMMUNITY): Payer: Self-pay | Admitting: Dentistry

## 2016-02-15 ENCOUNTER — Ambulatory Visit (HOSPITAL_COMMUNITY): Payer: Self-pay | Admitting: Dentistry

## 2016-02-15 VITALS — BP 151/46 | HR 81 | Temp 97.5°F | Wt 134.0 lb

## 2016-02-15 DIAGNOSIS — K117 Disturbances of salivary secretion: Secondary | ICD-10-CM

## 2016-02-15 DIAGNOSIS — Z923 Personal history of irradiation: Secondary | ICD-10-CM

## 2016-02-15 DIAGNOSIS — R682 Dry mouth, unspecified: Secondary | ICD-10-CM

## 2016-02-15 DIAGNOSIS — C01 Malignant neoplasm of base of tongue: Secondary | ICD-10-CM

## 2016-02-15 DIAGNOSIS — R432 Parageusia: Secondary | ICD-10-CM

## 2016-02-15 DIAGNOSIS — Z0189 Encounter for other specified special examinations: Secondary | ICD-10-CM

## 2016-02-15 NOTE — Patient Instructions (Signed)
RECOMMENDATIONS: 1. Brush after meals and at bedtime.  Use fluoride at bedtime. 2. Use trismus exercises as directed. 3. Use Biotene Rinse or salt water/baking soda rinses. 4. Multiple sips of water as needed. 5. Return to Dr. Tinnie Gens for follow-up dental care and 2-3 months.    Lenn Cal, DDS

## 2016-02-15 NOTE — Progress Notes (Signed)
02/15/2016  Patient Name:   [name] Date of Birth:   [birth year] Medical Record Number: [id number]  BP 151/46 mmHg  Pulse 81  Temp(Src) 97.5 F (36.4 C) (Oral)  Wt 134 lb (60.782 kg)  [name] presents for oral examination after aggressive palliative radiation therapy. Patient has completed all radiation treatments from 12/16/2015 through 01/12/2016.  REVIEW OF CHIEF COMPLAINTS: DRY MOUTH: Yes HARD TO SWALLOW: No  HURT TO SWALLOW: No TASTE CHANGES: Taste is returning slowly SORES IN MOUTH: Denies sores TRISMUS: Denies trismus symptoms. WEIGHT: 134 pounds down from her original weight of 150 pounds.  HOME OH REGIMEN:  BRUSHING: Twice a day FLOSSING: Once daily RINSING: Using salt water baking soda rinses and Biotene rinses. FLUORIDE: Using fluoride at bedtime. TRISMUS EXERCISES:  Maximum interincisal opening: 36 mm. Patient to start performing trismus exercises daily as instructed for the next 2 years.  DENTAL EXAM:  Oral Hygiene:(PLAQUE): Good oral hygiene LOCATION OF MUCOSITIS: None noted DESCRIPTION OF SALIVA: Decreased saliva. Moderate Xerostomia. ANY EXPOSED BONE: None noted OTHER WATCHED AREAS: Areas of attrition and flexure lesions DX: Xerostomia and Dysgeusia  RECOMMENDATIONS: 1. Brush after meals and at bedtime.  Use fluoride at bedtime. 2. Use trismus exercises as directed. 3. Use Biotene Rinse or salt water/baking soda rinses. 4. Multiple sips of water as needed. 5. Return to Dr. Tinnie Gens for follow-up dental care and 2-3 months.    Lenn Cal, DDS

## 2016-02-16 ENCOUNTER — Ambulatory Visit (HOSPITAL_COMMUNITY): Payer: Self-pay | Admitting: Dentistry

## 2016-02-17 ENCOUNTER — Ambulatory Visit: Payer: Self-pay | Admitting: Radiation Oncology

## 2016-02-18 ENCOUNTER — Telehealth: Payer: Self-pay | Admitting: Adult Health

## 2016-02-18 NOTE — Telephone Encounter (Signed)
I received an Inbasket message from triage RN letting me know that Jeani Hawking, the speech therapist at [facility] had called asking for me to return her call. She tells me that Ms. Effinger is doing well with her swallowing, oral care, and is participating in her swallowing exercises pretty regularly.  She is having a hard time with her appetite, which is keeping her from eating much solid food.  Jeani Hawking tells me that she will eat a few bites of food at mealtimes, but not much more than that.  She is still instilling 5 cans/day of tube feeding.  Jeani Hawking wanted to know if their staff needed to be more aggressive with trying to help with her appetite, or if she was "where she needed to be" based on her recovery.   I let Jeani Hawking know that I was very pleased to hear about her progress with her swallowing/oral care; that is very encouraging and will help her as she continues to recover.  She is about 5 weeks out from completing her radiation therapy. I let Jeani Hawking know that as long as her weight is stable and she is consuming plenty of fluids, that I am not as concerned with her appetite at this time.  However, I did let Jeani Hawking know that I would ask Dory Peru, our oncology dietitian to touch base with their RD at [facility] to discuss if there are different strategies/interventions to help the patient with her appetite.    [facility]'s RD is named Loyce Dys. Her number is: [phone].  I will forward this message to [facility] and have her reach out to [facility] whenever she can.    I thanked Jeani Hawking for calling us and keeping [place] updated on Ms. Correll's recovery.   Mike Craze, NP [place] [phone]

## 2016-02-22 ENCOUNTER — Ambulatory Visit: Payer: Medicare Other | Admitting: Nutrition

## 2016-02-22 ENCOUNTER — Encounter: Payer: Self-pay | Admitting: General Practice

## 2016-02-22 ENCOUNTER — Telehealth: Payer: Self-pay | Admitting: Adult Health

## 2016-02-22 ENCOUNTER — Ambulatory Visit (HOSPITAL_BASED_OUTPATIENT_CLINIC_OR_DEPARTMENT_OTHER): Payer: Self-pay | Admitting: Adult Health

## 2016-02-22 VITALS — BP 155/64 | HR 92 | Temp 97.5°F | Resp 14 | Wt 139.3 lb

## 2016-02-22 DIAGNOSIS — F32A Depression, unspecified: Secondary | ICD-10-CM

## 2016-02-22 DIAGNOSIS — F329 Major depressive disorder, single episode, unspecified: Secondary | ICD-10-CM

## 2016-02-22 DIAGNOSIS — C01 Malignant neoplasm of base of tongue: Secondary | ICD-10-CM

## 2016-02-22 MED ORDER — MIRTAZAPINE 7.5 MG PO TABS: 7.5000 mg | ORAL_TABLET | Freq: Every day | ORAL | Status: DC

## 2016-02-22 MED FILL — MIRTAZAPINE 15 MG TABLET: 15 | 30 days supply | Qty: 15 | Fill #0

## 2016-02-22 NOTE — Progress Notes (Signed)
Nutrition follow-up completed with patient status post treatment for carcinoma of the base of the tongue. Patient reports she is eating bites of food, usually 2-3, several meals daily. Calories from these bites contribute negligibly to overall calorie intake. Patient is requesting to discontinue last feeding at 8 PM Weight improved and was documented as 138 pounds up approximately 4 pounds from 134 pounds. Patient is giving 5 cans of Osmolite 1.5 daily with free water before and after bolus feedings. Denies nutrition impact symptoms.  Nutrition diagnosis: Unintended weight loss improved. Nutrition related knowledge deficit improved.  Intervention:  Educated patient to consume one bottle of Ensure Plus daily.  If patient can achieve this goal, she can eliminate the fifth can of Osmolite 1.5 via feeding tube. Patient was able to demonstrate consuming Ensure Plus by mouth without difficulty. Encouraged patient to try to increase bites and sips of food at each meal. Encouraged patient to strive for weight maintenance/weight gain. Teach back method used.  Monitoring, evaluation, goals: Patient will work to tolerate one bottle of Ensure Plus and four cans of Osmolite 1.5 to meet minimum estimated nutrition needs.  Next visit: To be scheduled approximately one month from now.  **Disclaimer: This note was dictated with voice recognition software. Similar sounding words can inadvertently be transcribed and this note may contain transcription errors which may not have been corrected upon publication of note.**

## 2016-02-22 NOTE — Telephone Encounter (Signed)
I received a voicemail on Sunday, 02/21/16 from [facility], the patient's daughter.  In her message, [name] tells me that she and the social workers at [facility] would like me to consider talking to the patient about starting a low-dose antidepressant, as they feel she is "not able to enjoy most of her days."  [name] also tells me that [name] "felt a lump" in her neck and is wondering if we would want to move up the PET scan to an earlier time.  [name] is also continuing to have tongue pain and declines the twice daily dosing of the Tramadol at times.    I will be sure to address these concerns at their visit with me this afternoon.   Mike Craze, NP [place] [phone]

## 2016-02-22 NOTE — Patient Instructions (Signed)
-  Start the Remeron 7.5 mg at bedtime.  This will help you sleep, help your mood, and help stimulate your appetite.  Be patient as this medication will take a few weeks to start working.  We can always go up on the dose if we need to.  -Continue taking your stool softeners and Miralax daily to prevent constipation.  -Take a suppository today for constipation.    Call me with any questions! Mike Craze, NP [phone]

## 2016-02-22 NOTE — Progress Notes (Signed)
Spiritual Care Note  Referred by Alisa Graff and Pamala Hurry Neff/RD to add emotional support to [name]'s team.  Met her and her middle daughter (who lives an hour away; other two daughters farther) briefly to introduce Spiritual Care.  Provided my card and brochure, and received pt's temp number from dtr in order to set up f/u appt (pt's apt landline until ca May 9:  [phone]).  Plan to f/u by phone, but please also page as needs arise/circumstances change.  Thank you.  [place], [place], [facility] Pager [phone] Voicemail  [phone]

## 2016-02-23 ENCOUNTER — Telehealth: Payer: Self-pay | Admitting: Adult Health

## 2016-02-23 ENCOUNTER — Encounter: Payer: Self-pay | Admitting: Internal Medicine

## 2016-02-23 ENCOUNTER — Non-Acute Institutional Stay (SKILLED_NURSING_FACILITY): Payer: Medicare Other | Admitting: Internal Medicine

## 2016-02-23 ENCOUNTER — Encounter: Payer: Self-pay | Admitting: Adult Health

## 2016-02-23 ENCOUNTER — Encounter: Payer: Self-pay | Admitting: General Practice

## 2016-02-23 DIAGNOSIS — L120 Bullous pemphigoid: Secondary | ICD-10-CM | POA: Diagnosis not present

## 2016-02-23 DIAGNOSIS — C01 Malignant neoplasm of base of tongue: Secondary | ICD-10-CM

## 2016-02-23 DIAGNOSIS — F329 Major depressive disorder, single episode, unspecified: Secondary | ICD-10-CM

## 2016-02-23 DIAGNOSIS — W19XXXA Unspecified fall, initial encounter: Secondary | ICD-10-CM

## 2016-02-23 DIAGNOSIS — E860 Dehydration: Secondary | ICD-10-CM | POA: Diagnosis not present

## 2016-02-23 DIAGNOSIS — Z931 Gastrostomy status: Secondary | ICD-10-CM | POA: Diagnosis not present

## 2016-02-23 DIAGNOSIS — F32A Depression, unspecified: Secondary | ICD-10-CM

## 2016-02-23 DIAGNOSIS — I951 Orthostatic hypotension: Secondary | ICD-10-CM | POA: Diagnosis not present

## 2016-02-23 NOTE — Progress Notes (Signed)
REASON FOR VISIT:  Routine follow-up for head & neck cancer   BRIEF ONCOLOGIC HISTORY:    Malignant neoplasm of base of tongue ([place])   10/12/2015 Imaging CT neck: Base of tongue mass measuring approximately 14 mm. There may be additional tumor extending to the left of this mass.  Malignant adenopathy throughout right neck. Image quality degraded by moderate motion.    10/30/2015 Initial Biopsy Right base of tongue biopsy.  Path revealed mucoepidermoid carcinoma.     10/30/2015 Initial Diagnosis Malignant neoplasm of base of tongue ([place])   11/11/2015 Procedure Flexible fiberoptic laryngoscopy: Large submucosal mass noted at the base of tongue and vallecula on the right, with extension back to the vallecula with a more exophytic component directly in midline, which stopped short of the sulcus of the vallecula wi   11/13/2015 PET scan Irregular hypermetabolic mass in right oropharynx, measuring 3.9 x 3.3 cm, (considerably larger than reported on 10/12/15 CT neck). Hypermetabolic right level 2 and right level 3 LN mets. No distant hypermetabolic metastatic disease.   12/16/2015 - 01/12/2016 Radiation Therapy Aggressive palliative radiaiton Isidore Moos). Base of tongue and upper neck / 46 Gy in 20 fractions.  Right lower neck / 40 Gy in 20 fractions     INTERVAL HISTORY:  Ms. Laughton reports feeling relatively well since her last visit with Dr. Isidore Moos about 1 month ago.  Her tube feedings have been going well, although she does not like having to do a feeding every 3 hours during the day; this takes a lot of energy for her to do.  She has been participating in speech therapy, which has also been going well.  She has no problems swallowing.  Her pain is generally well-controlled with the Tramadol or Tylenol.  Sometimes the pain radiates up to her ear; it is difficult for her to describe this pain, but she states it is "achy and just hurts."  She is having trouble with constipation today. Her last BM was 3  days ago when she reports going 4 times that day and having loose stools/diarrhea at that time. She continues to live in the assisted living portion of [facility], which she does not like.  She wants to be able to live independently and this causes her a lot of stress and makes her feel down.  She has trouble finding joy in things and isn't able to enjoy the things she used to enjoy; denies suicidal ideation.  Her daughter, Warren Lacy, is with her in clinic today.    ADDITIONAL REVIEW OF SYSTEMS:  Review of Systems  Constitutional: Negative for fever and weight loss.  HENT:       Right tongue/mouth pain that radiates up to right ear (+) xerostomia  Gastrointestinal: Positive for nausea and constipation. Negative for vomiting.       -Some nausea, no vomiting (pt relates this to food she ate) -Chronic issues with constipation; last BM was 3 days ago. Had 4 loose stools on Friday (02/19/16), and none since.   Genitourinary: Negative for dysuria and hematuria.       -chronic urinary incontinence; reports having to get up many times at night to urinate (every 2 hours)  Psychiatric/Behavioral: Positive for depression.       Reports feeling frustrated by the amount of time that her tube feedings take per day.  She doesn't feel like she is able to enjoy the things she used to enjoy.       CURRENT MEDICATIONS:  Current Outpatient Prescriptions  on File Prior to Visit  Medication Sig Dispense Refill  . acetaminophen (TYLENOL) 500 MG tablet Take 1 tablet (500 mg total) by mouth every 6 (six) hours as needed for mild pain. May take 1-2 tablets. Do not exceed 4000 mg per day. 120 tablet 5  . Ascorbic Acid (VITAMIN C) 1000 MG tablet Take 1,000 mg by mouth daily.    . cholecalciferol (VITAMIN D) 1000 UNITS tablet Take 2,000 Units by mouth daily.    Marland Kitchen levothyroxine (SYNTHROID, LEVOTHROID) 75 MCG tablet TAKE 1 TABLET BY MOUTH EVERY MORNING 90 tablet 1  . lidocaine (XYLOCAINE) 2 % solution Caregiver:Mix 1part 2%  viscous lidocaine, 1part H20. Swish and/or swallow 51mL of this mixture, 73min before meals and at bedtime, up to QID 100 mL 5  . Nutritional Supplements (FEEDING SUPPLEMENT, OSMOLITE 1.5 CAL,) LIQD Begin 120 cc Osmolite 1.5 QID with 60 cc free water before and after each feeding. Day 3, increase to 180 cc Osmolite 1.5 as tolerated. Day 5 increase to 240 cc Osmolite 1.5 as tolerated. Day 7, increase to 240 cc Osmolite 1.5 5 times daily. Flush or drink an additional 240 cc free water daily. Please send feedings and supplies. 5 Bottle 0  . polyethylene glycol (MIRALAX / GLYCOLAX) packet 17 g by PEG Tube route daily. Flush with 30cc's before and after administering miralax    . predniSONE (DELTASONE) 20 MG tablet Take 5 mg by mouth daily with breakfast.    . senna (SENOKOT) 8.6 MG TABS tablet Take 2 tablets (17.2 mg total) by mouth at bedtime. May take every other day if stools become loose. 60 each 5  . traMADol (ULTRAM) 50 MG tablet 75 mg by PEG Tube route every 6 (six) hours as needed (pain).     No current facility-administered medications on file prior to visit.    ALLERGIES:  Allergies  Allergen Reactions  . Sulfa Antibiotics   . Ampicillin Swelling  . Chlorine   . Codeine   . Macrobid [Nitrofurantoin Monohydrate Macrocrystals]   . Statins   . Trimethoprim      PHYSICAL EXAM:  Filed Vitals:   02/22/16 1320  BP: 155/64  Pulse: 92  Temp: 97.5 F (36.4 C)  Resp: 14      Weight Date  139 lb 4.8 oz (63.186 kg) 02/22/16  135 lb 11.2 oz (61.553 kg) 01/25/16  133 lb 9.6 oz (60.601 kg) 12/28/15  138 lb 12.8 oz (62.959 kg) 12/24/15  138 lb 11.2 oz (62.914 kg) 12/21/15  140 lb 3.2 oz (63.594 kg) 12/14/15  142 lb 8 oz (64.638 kg) 12/08/15  143 lb 3.2 oz (64.955 kg) Pre-treatment: 12/04/15   General: Elderly female in no acute distress.  Accompanied in clinic today by her daughter.  HEENT: Head is normocephalic.  Pupils equal and reactive to light. Conjunctivae clear without exudate.  Sclerae  anicteric. No left ear canal redness or drainage; TM visualized.  Right ear with mild effusion noted to middle ear. No evidence of infection. Oral mucosa is dry; no lesion noted to left buccal region of patient concern for an ulcer.  Healed mucositis. Tongue is dry. Posterior oropharynx difficult to visualize. Lymph: There is a new palpable ~1cm, round node located in the right submandibular region.  No other palpable adenopathy noted in the cervical, supraclavicular, or infraclavicular regions.  Cardiovascular: Regular rate and rhythm.  Respiratory: Clear to auscultation bilaterally. Breathing non-labored.   GI: Abdomen soft and round. Non-tender.  Normoactive bowel sounds.  GU: Deferred.  Neuro: No focal deficits. Slow, but steady gait.  Psych: Slightly flat affect and depressed mood.  Extremities: No edema. Skin: Warm and dry.    LABORATORY DATA:  None at this visit.  DIAGNOSTIC IMAGING:  None at this visit.    ASSESSMENT & PLAN:  Ms. Soderman is a very pleasant 82 y.o. 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   1. Cancer of the right base of tongue: Ms. Clouden is continuing to recover from palliative radiation therapy.  She does have a palpable right submandibular lymph node (about 1 cm in size).  I will make Dr. Isidore Moos aware, but offered reassurance to the patient that this was likely post-radiation inflammatory changes to the lymph node.  I reviewed with them the purpose of the PET scan and reviewed her initial staging PET scan images with her.  She will have her restaging PET scan at the end of 03/2016 and subsequent follow-up visit with Dr. Isidore Moos.   2. Adjustment disorder with depressed mood: Ms. Stuver expresses having difficulty finding joy and endorses having a hard time coping from her illness.  This is quite common in  cancer survivors after they complete treatment and have time to begin to process their emotions.  She endorses having little motivation to do things because of her current situation.  The tube feeding schedule and her current living situation in assisted living are 2 of her biggest identified stressors.  Ms. Coltrain seems to be experiencing some symptoms of grief; grief of the loss of her health, as well as the loss of much of her independence.  She is not able to enjoy the things she used to enjoy, namely music. We discussed the possibility of having a portable music player for her to be able to enjoy music in her current living environment, which may be helpful.  We discussed the opportunity for her to meet/talk with our oncology chaplain, Lorrin Jackson, [facility], who has a lot of specialized training in grief counseling and helping patients adjust to life after cancer.  Ms. Lohmeier is open to having Lattie Haw contact her for additional support.  We also discussed the opportunity to try a pharmacologic intervention as well.  I explained that there are many different drugs to help with depression and that these medications do not always have to be used for a lifetime, as many times depressive symptoms can be situational.  However, I do think she is exhibiting some signs of clinical depression, which warrants more aggressive treatment.  She denies suicidal ideation when asked directly.  I explained the difference between SSRIs and medications like Remeron.  I talked to her about the option of Remeron, as it has the dual benefit of mood stability, as well as an appetite stimulant.  Ms. Smeaton and her daughter are enthusiastic about this as an option.  I explained the potential side effects of Remeron could include mild dizziness, during the initial phases of starting the medication, but that overall Remeron is generally well-tolerated, particularly in the elderly.  I let her know that I would intentionally be  prescribing a low-dose (sub-therapeutic) dose of the Remeron to assess how she tolerates it and we would titrate up to the desired dose of 15 mg as we monitor her tolerance to the medication.  She and her daughter voiced understanding and agreed with this plan.  Remeron 7.5  mg po QHS, #30, 5 refills, was e-prescribed to [facility].    3. Decreased appetite: This is likely multifactorial given Ms. Martensen's recent radiation therapy, dysgeusia, and xerostomia.  Her advanced age may also be contributing to her decreased appetite.  However, she has gained ~4 lbs since her last visit about 1 month ago, which is very encouraging.  She states that she often feels too full or just doesn't want anything to eat by mouth at the times the dining room is open at the assisted living facility.  This is difficult for the patient because she has a hard time with such a regimented schedule for her meal times.  She would really like to get rid of the 5th tube feeding at night, but I will defer to Dory Peru, RD's recommendations on that.  The patient will see Raford Pitcher today after her visit with me.  (please see her notes for additional documentation).  I am hopeful the Remeron will also help her appetite once we are able to get her to a therapeutic dose.    4. Pain:  She continues to use the Tramadol as needed for pain.  She generally takes a dose in the morning and evening at [facility] facility; sometimes she declines these doses and subsequently has more pain.  She also takes Tylenol prn as well.  She does report some ear pain, which may potentially be related to some facial nerve inflammation s/p radiation treatment to her head and neck; this will hopefully improve with time.  I encouraged her to continue to take the Tramadol and Tylenol as needed for pain.    5. Constipation: Ms. Hilke has chronic issues with constipation.  She reports having diarrhea about 3 days and has not had a  BM since that time.  I encouraged her to use a suppository today, as I do not want her to become impacted (as she has in the past).  She understands the importance of an adequate bowel regimen in the setting of pain medication use.    I encouraged Ms. Mathia and her daughter to ask questions regarding her above plan of care. All questions were answered to her stated satisfaction.  A written copy of the After Visit Summary with the plan of care was provided to the patient with an extra copy to share with the [facility] staff.  They know to call me with additional questions.    Dispo:  -Of note: Code Status-DNR -Return to clinic to see me in about 1 month to assess effects of Remeron.  -She will see Dory Peru, RD today for follow-up. -I will ask Lorrin Jackson, [facility] to contact the patient for additional support.       A total of 60 minutes was spent with this patient in face-to-face care, with greater than 50% of that time in counseling and care-coordination.  Mike Craze, NP [place] [phone]    (Coding/Billing notation: This patient is a no charge per Dr. Pablo Ledger, Medical Director of Survivorship; pt has never been seen by medical oncology)

## 2016-02-23 NOTE — Progress Notes (Signed)
Spiritual Care Note  Had long call from middle daughter [name], following up on our brief meeting yesterday and Elzie Rings Dawson/NP's recommendation by phone today.  [name] shared nearly identical concerns with me, and we talked about how Spiritual Care could potentially be supportive to pt's (and family's) processing of her physical and emotional situation.  Provided reflective listening and support to [name], as well.  Phoned Ms Lauro this afternoon; she immediately shared that she is feeling very sick today and is unable to talk to anyone.  She agreed to a f/u call at a time when she feels better.  Following for support and plan to f/u by phone, but please also page as needs arise/circumstances change.  Thank you.  [place], [place], [facility] Pager [phone] Voicemail  [phone]

## 2016-02-23 NOTE — Telephone Encounter (Signed)
I received a call from [name] this morning to let me know that she woke up very dizzy this morning and fell and is very upset.  She states that she "fell on her bottom", but scraped her knee.  [name] thinks the fall is the result of her taking the first dose of Remeron last night at bedtime.  I reminded [name] that we had discussed yesterday that the Remeron can cause dizziness, but is often tolerated without major side effects.  I also intentionally started her off with a sub-therapeutic dose of 7.5 mg to assess her tolerability to the medication.    I was able to speak with her nurse at [facility] who let me know that they checked her blood pressure after the fall and seated her BP was 112/34, and with standing her BP was 84/45.  I let the nurse know that while the Remeron could definitely contribute to her dizziness, it sounds like she may also potentially be dehydrated.  The nurse let me know that her PCP, Dr. Joneen Caraway will be coming out to [facility] today to evaluate the patient.  I let the nurse know that I will defer to her PCP for his medical recommendations and management as he evaluates her today.  I thanked the nurse for calling me and making me aware.  I encouraged them to call me with other questions.    Mike Craze, NP [place] [phone]

## 2016-02-23 NOTE — Progress Notes (Signed)
Patient ID: [name], female   DOB: 09-12-1924, 82 y.o.   MRN: [id number]  Location:  [facility] Room Number: 146 Place of Service:  SNF (31) Provider:   Hollace Kinnier, DO  Patient Care Team: Gayland Curry, DO as PCP - General (Geriatric Medicine) [facility] Deneise Lever, MD as Consulting Physician (Pulmonary Disease) Jerrell Belfast, MD as Consulting Physician (Otolaryngology) Rolm Bookbinder, MD as Consulting Physician (Dermatology) [name], MD as Attending Physician (Radiation Oncology) Holley Bouche, NP as Nurse Practitioner (Nurse Practitioner)  Extended Emergency Contact Information Primary Emergency Contact: San Jet[place] of [place] Phone: [phone] Relation: Other Secondary Emergency Contact: Lepore,[name]          [place], [place] Mobile Phone: [phone] Relation: Daughter  Code Status:  DNR Goals of care: Advanced Directive information Advanced Directives 02/23/2016  Does patient have an advance directive? Yes  Type of Advance Directive Out of facility DNR (pink MOST or yellow form);Living will;Healthcare Power of Attorney  Copy of advanced directive(s) in chart? Yes  Pre-existing out of facility DNR order (yellow form or pink MOST form) Yellow form placed in chart (order not valid for inpatient use);Pink MOST form placed in chart (order not valid for inpatient use)  MOST in place  Chief Complaint  Patient presents with  . Acute Visit    patient stood up and fell backwards this morning    HPI:  Pt is a 82 y.o. female with malignant neoplasm of tongue s/p XRT, bullous pemphigoid, htn, asthma, hypothyroid seen today for an acute visit for fall this am.  She completed XRT several weeks ago and has had a PEG in place for some of her feeding and has been taking minimal po otherwise.  She is not drinking well and knows this.  Her spirits have been poor  lately, as well. She says she's not sure what's wrong with her.  She admitted to feeling isolated staying in [facility] respite and had been looking forward to the meeting Friday about going home.    She was just started on remeron low dose first tablet last night to help with mood, appetite and sleep.  She is adamant that it has caused her lightheadedness and dizziness which have been happening today.  She mentions her sensitivity to medications.  Staff and myself are move concerned about dehydration and she's had orthostatics that confirm this.  She admits to it, as well.  She's had a few sips of boost and a few sips of tea today.  BP dropped from 112/64 sitting to 83/45 on standing. She says she feels terrible.  She also is clear she is not ready to die or "give up" so to speak.  She says she has her three beautiful daughters and wants to spend some more time with them.  She agreed to [facility] so we can tell how dehydrated she is and give the right amount of fluids.She refuses to take anymore remeron despite discussion at this time.  Past Medical History  Diagnosis Date  . Nasal polyps   . Osteoporosis   . Mitral valve disorders   . Chronic rhinitis   . Unspecified urinary incontinence   . Asthma   . Hyperlipidemia   . Hypertension   . Urge incontinence   . Unspecified hypothyroidism   . Bilateral claudication of lower limb ([place]) 08/29/2013    Bilateral posterior thighs  . Aortic atherosclerosis ([place]) 08/29/2013  Identified on Aortic ultrasound exam 2008   . Bullous pemphigoid 02/2015    Dr. Wilhemina Bonito  . Arthritis    Past Surgical History  Procedure Laterality Date  . Nasal polyp surgery  03/2002    x2 Dr Wilburn Cornelia  . Tonsillectomy    . Total abdominal hysterectomy w/ bilateral salpingoophorectomy  1970's  . Bunionectomy Left 12/1998  . Milk cyst right breast    . Breast lumpectomy Right 1953  . Bunionectomy Right 02/2000  . Eye surgery Bilateral 02/2000    Cataract removal  . Root canal   02/2014  . Direct laryngoscopy N/A 10/30/2015    Procedure: DIRECT LARYNGOSCOPY AND BIOPSY OF TONGUE MASS;  Surgeon: Jerrell Belfast, MD;  Location: [facility];  Service: ENT;  Laterality: N/A;    Allergies  Allergen Reactions  . Sulfa Antibiotics   . Ampicillin Swelling  . Chlorine   . Codeine   . Macrobid [Nitrofurantoin Monohydrate Macrocrystals]   . Statins   . Trimethoprim       Medication List       This list is accurate as of: 02/23/16  4:22 PM.  Always use your most recent med list.               acetaminophen 500 MG tablet  Commonly known as:  TYLENOL  Take 1 tablet (500 mg total) by mouth every 6 (six) hours as needed for mild pain. May take 1-2 tablets. Do not exceed 4000 mg per day.     cholecalciferol 1000 units tablet  Commonly known as:  VITAMIN D  Take 2,000 Units by mouth daily.     feeding supplement (OSMOLITE 1.5 CAL) Liqd  Begin 120 cc Osmolite 1.5 QID with 60 cc free water before and after each feeding. Day 3, increase to 180 cc Osmolite 1.5 as tolerated. Day 5 increase to 240 cc Osmolite 1.5 as tolerated. Day 7, increase to 240 cc Osmolite 1.5 5 times daily. Flush or drink an additional 240 cc free water daily. Please send feedings and supplies.     levothyroxine 75 MCG tablet  Commonly known as:  SYNTHROID, LEVOTHROID  TAKE 1 TABLET BY MOUTH EVERY MORNING     lidocaine 2 % solution  Commonly known as:  XYLOCAINE  Caregiver:Mix 1part 2% viscous lidocaine, 1part H20. Swish and/or swallow 40mL of this mixture, 41min before meals and at bedtime, up to QID     polyethylene glycol packet  Commonly known as:  MIRALAX / GLYCOLAX  17 g by PEG Tube route daily. Flush with 30cc's before and after administering miralax     predniSONE 20 MG tablet  Commonly known as:  DELTASONE  Take 5 mg by mouth daily with breakfast.     senna 8.6 MG Tabs tablet  Commonly known as:  SENOKOT  Take 2 tablets (17.2 mg total) by mouth at bedtime. May take every other day if stools  become loose.     traMADol 50 MG tablet  Commonly known as:  ULTRAM  Take 75 mg by mouth 2 (two) times daily.        Review of Systems  Constitutional: Positive for activity change and appetite change. Negative for fever and chills.       Feels "terrible"   HENT: Negative for congestion and sore throat.   Respiratory: Negative for chest tightness and shortness of breath.   Cardiovascular: Negative for chest pain, palpitations and leg swelling.  Gastrointestinal: Positive for constipation. Negative for nausea, abdominal pain and  diarrhea.       PEG in place  Musculoskeletal:       Buttock pain s/p fall   Allergic/Immunologic: Positive for immunocompromised state.  Neurological: Positive for dizziness, weakness and light-headedness. Negative for numbness.  Hematological: Bruises/bleeds easily.       Left hand ecchymoses, left knee skin tear  Psychiatric/Behavioral:       Depressed, lonely, frustrated and a bit confused today which is new    Immunization History  Administered Date(s) Administered  . Influenza Split 07/25/2011, 07/24/2012  . Influenza Whole 07/24/2010, 08/16/2013  . Influenza-Unspecified 08/11/2014  . Pneumococcal Conjugate-13 11/18/2014  . Pneumococcal Polysaccharide-23 01/17/2004   Pertinent  Health Maintenance Due  Topic Date Due  . INFLUENZA VACCINE  05/24/2016  . DEXA SCAN  Completed  . PNA vac Low Risk Adult  Completed   Fall Risk  02/23/2016 01/25/2016 12/04/2015 06/17/2015 05/07/2014  Falls in the past year? Yes No No No No  Number falls in past yr: 1 - - - -  Injury with Fall? No - - - -  Risk for fall due to : History of fall(s);Impaired mobility;Impaired balance/gait - - - -  Follow up Falls evaluation completed;Education provided;Falls prevention discussed - - - -   Functional Status Survey: Is the patient deaf or have difficulty hearing?: No Does the patient have difficulty seeing, even when wearing glasses/contacts?: No Does the patient have  difficulty concentrating, remembering, or making decisions?: No Does the patient have difficulty walking or climbing stairs?: No Does the patient have difficulty dressing or bathing?: No Does the patient have difficulty doing errands alone such as visiting a doctor's office or shopping?: No  Filed Vitals:   02/23/16 1359  BP: 112/64  Pulse: 84  Temp: 97.6 F (36.4 C)  TempSrc: Oral  Resp: 22  Weight: 134 lb (60.782 kg)  SpO2: 95%   Body mass index is 24.5 kg/(m^2). Physical Exam  Constitutional: She is oriented to person, place, and time. She appears well-developed.  Seated in chair, keeps yawning, appears exhausted  Cardiovascular: Normal rate and regular rhythm.   Pulmonary/Chest: Effort normal and breath sounds normal.  Abdominal: Soft. Bowel sounds are normal.  PEG in place  Neurological: She is alert and oriented to person, place, and time.  Skin:  Increased skin tenting and delayed cap refill  Psychiatric:  Flat affect today, not herself, repeating herself some and confused at times    Labs reviewed:  Recent Labs  10/23/15 0940  12/30/15 1150 12/31/15 1442 12/31/15 1443 01/07/16 1037 01/11/16 1045 01/11/16 1045  NA 141  --  139  --  135* 135* 136  --   K 4.3  --  4.1  --  4.6 4.3 4.6  --   CL 105  --  105  --   --   --   --   --   CO2 27  --  26  --  25 28 30*  --   GLUCOSE 94  --  90  --  146* 123 165*  --   BUN 17  < > 20  --  30.1* 24.2 25.8  --   CREATININE 0.93  < > 0.91  --  1.3* 0.9 0.9  --   CALCIUM 9.5  --  9.3  --  10.0 9.6 9.8  --   MG  --   --   --   --  1.8 1.8 2.2  --   PHOS  --   --   --  4.0  --  3.1  --  3.2  < > = values in this interval not displayed.  Recent Labs  12/31/15 1443 01/07/16 1037 01/11/16 1045  AST 16 22 38*  ALT 10 24 47  ALKPHOS 66 67 88  BILITOT 0.86 0.38 0.32  PROT 7.4 6.4 6.6  ALBUMIN 3.7 2.9* 3.0*    Recent Labs  06/09/15 10/23/15 0940 12/30/15 1150  WBC 8.1 9.9 9.1  NEUTROABS  --   --  7.4  HGB 13.4  12.8 13.4  HCT 40 40.7 42.2  MCV  --  99.5 99.3  PLT 378 390 382   Lab Results  Component Value Date   TSH 2.22 02/20/2014   No results found for: HGBA1C Lab Results  Component Value Date   CHOL 188 06/09/2015   HDL 90* 06/09/2015   LDLCALC 86 06/09/2015   TRIG 85 06/09/2015    Assessment/Plan 1. Orthostatic hypotension -due to poor po intake -does have peg with flushes, but not taking much else and knows she's dehydrated  2. Dehydration -clinically present; await chemical confirmation with BMP ordered to determine fluid volume to administer by IV which she has agreed to  3. Malignant neoplasm of base of tongue (HCC) -s/p palliative XRT -doing well in terms of her swallowing--denies pain, but continues to take minimal po due to no appetite and generalized malaise  4. PEG (percutaneous endoscopic gastrostomy) status (HCC) -cont PEG feedings and flushing -may need to up flushes if this becomes a recurrent problem -did discuss alternative of not pushing these issues and allowing comfort, but pt not ready to stop yet  5. Bullous pemphigoid -cont prednisone therapy  6. Fall, initial encounter -bottom sore when she moves, ok at rest, small abrasion to left knee and ecchymoses to left hand; has prn tramadol if desired, but says she's fine resting in recliner -cont walker use  7. Depression -refuses to continue on remeron due to dizziness today -I had suggested that we treat her dehydration and consider trying it again then, but she wanted nothing of it  Family/ staff Communication: discussed with nursing, nurse manager  Labs/tests ordered:  BMP stat

## 2016-02-23 NOTE — Telephone Encounter (Signed)
I received a call from [name], one of Ms. Backer's daughters who reported feeling so frustrated with her mother.  She, too, does not feel like her fall this morning was related to the Remeron and is upset because she really thinks the medication would help her Mom.  [name] feels like her mother will never try the Remeron again because "she has it in her mind that it made her fall."  She is concerned about why her mother could be dehydrated/have low blood pressure that could have contributed to her fall.  I explained that it is not uncommon for patients to still experience some dehydration, even with tube feedings, as they continue to heal from cancer treatments.  It is also possible that the Remeron caused her dizziness, but it seems less likely given the sub-therapeutic dose of 7.5 mg.    [name] states that she is not sure if her mom will come to tonight's H&N FYNN class.  She has decided not to ask her mom about it, because she states, "If I talk to her about anything she should be doing, she bucks and doesn't want to do it."    I validated her concerns and offered support.  She wishes her mom would not be depressed and would "do more" to get well.  I let [name] know that there are other medications we could try for the depression.  I would also be happy to talk with Dr. Joneen Caraway after she is evaluated today if there are any questions; we are happy to help in any way we can to optimize Ms. Agard's recovery.  I encouraged [name] to call me with any additional questions or concerns as well.  She voiced appreciation for my listening and expressed that she may call Lorrin Jackson, our oncology chaplain to help both she and her mom better cope with what is going on.  I encouraged her to do so.    Mike Craze, NP [place] [phone]

## 2016-02-24 ENCOUNTER — Telehealth: Payer: Self-pay | Admitting: Adult Health

## 2016-02-24 NOTE — Telephone Encounter (Signed)
I received a call from [facility], the patient's daughter, with additional concerns today.  She tells me that her mom has refused the Tramadol and Senokot today. She apparently had an "accident" with loose stool last night.  Her labs at [facility] revealed she has minor dehydration. [name] is concerned that "something else is wrong with her other than dehydration."  I explained that dehydration in older patients can lead to behavioral changes; I encouraged her to ask the staff to check her urine for possible UTI, which may be contributing to several of her symptoms (different mood, behaviors, etc).  She apparently only took 2 tube feedings yesterday.   [name] has questions about having her mom formally evaluated by a psychiatrist for depression.  I let her know that this could be appropriate, at the discretion of the [facility] staff.  I offered that perhaps Ms. Dalsanto is exerting control over what little she has control over at this time, namely her medications and her tube feedings.  I offered support and encouraged [name] to let us know if she had any additional concerns or anything we could help with, but I defer to the medical mgmt of the staff at [facility].   Mike Craze, NP [place] [phone]

## 2016-02-26 ENCOUNTER — Telehealth: Payer: Self-pay | Admitting: Adult Health

## 2016-02-26 NOTE — Telephone Encounter (Signed)
I received a call from [place] at [facility] to let me know that the patient and her daughter had a "care plan meeting" with the staff today.  They have decided to discontinue the scheduled doses of Tramadol, as they think this may be contributing to her "fuzzy-headedness" and dizziness.  Rebekah Chesterfield is asking if this is okay with her oncology team because we had previously ordered for this medication to be scheduled.   I let Gwyn know that I thought it was completely reasonable to d/c the scheduled doses of the pain medication, but my only concern is that Ms. Scritchfield will not ask for the prn medications when she is actually hurting; this was my rationale in scheduling the Tramadol doses initially.  However, she is several weeks out from treatment now and ideally her pain should be continuing to improve. Rebekah Chesterfield stated that she would ensure that the nursing staff are at least assessing her pain once per shift to see if she needs the prn pain medications.  I also encouraged the staff to offer her the Tylenol if she doesn't want the Tramadol, as this has been effective in managing her pain recently as well.    I thanked Rebekah Chesterfield for calling and making [place] aware. Encouraged her to call us with any additional questions, concerns, or suggestions on how we can best optimize Ms. Deveney's care together.   Mike Craze, NP [place] [phone]

## 2016-03-14 ENCOUNTER — Telehealth: Payer: Self-pay | Admitting: *Deleted

## 2016-03-14 ENCOUNTER — Telehealth: Payer: Self-pay | Admitting: Adult Health

## 2016-03-14 ENCOUNTER — Encounter: Payer: Self-pay | Admitting: General Practice

## 2016-03-14 NOTE — Progress Notes (Signed)
Spiritual Care Note  Received f/u call from dtr [name] to process her own caregiving stress and to explore support resources for pt.  Provided empathic/reflective listening, affirmation, normalization of feelings, and encouragement.  Plan to mail [name] updated [facility] programming information tomorrow. [name] plans to phone me for emotional support appointment for both of them, probably to occur 5/24 or 5/25.   [place], [place], [place] Pager [phone] Voicemail [phone]

## 2016-03-14 NOTE — Telephone Encounter (Signed)
Patient's daughter calling asking why Dr. Mariea Clonts refuses to test patient's urine? Daughter states she also confused as to why no one thinks Dr. Mariea Clonts is pt's PCP. I tried explaining that pt was seen in skilled care and that doesn't make her automatically Dr. Cyndi Lennert pt which she understood and now they have an appt 04/06/16. After further looking pt was seen in 2016 at [place] [facility]. Also after asking if pt is having any symptoms of UTI daughters states she doesn't know, but she wanted the urine checked because pt is not feeling well after going thru chemo. Please advise

## 2016-03-14 NOTE — Telephone Encounter (Signed)
I received a call from Ms. Bolinger's daughter, [name], with questions/concerns regarding her mom.  [name] tells me that she has requested a doctor's order for a urinalysis, but has had some difficulty in obtaining that order.  She wants to know what I think she should do and if I can provide the order for the UA.  I let [name] know that since a potential UTI would not be related to the patient's recovery from her H&N cancer, that she should reach out to her PCP, Dr. Mariea Clonts to obtain the order.  [name] tells me that the past 2 time she has taken her mom to see her urologist (last visit was 08/2015 per [name]), that the patient has had a UTI each time.  I encouraged [name] to start with Dr. Mariea Clonts, but she could also reach out to her urologist with any urinary concerns as well.   Ms. Kuechle is still struggling with depression and eating per [name].  I will plan on seeing Ms. Schwarz next week as a final follow-up visit before her PET scan in 03/2016.  I have scheduled that follow-up appt with [name] during this call.  I look forward to seeing this patient then and encouraged [name] or the patient to call me with any questions or concerns before that time.   Mike Craze, NP [place] [phone]

## 2016-03-14 NOTE — Telephone Encounter (Signed)
Pt is wanting to become a patient of yours, pt states she is having problems getting over chemo and would like to become your patient. appt scheduled 04/06/16 would pt be new? Pt has never been seen in clinic only in SNF. Please advise pt is very eager.

## 2016-03-15 NOTE — Telephone Encounter (Signed)
Ms. Suppa has been seeing me since I started here.  I'm sorry about the confusion. UTI is not the only reason 82 yo patients get confused.  While in rehab, her urine was tested and negative.  In respite care, she was noted to be dehydrated, was not properly administering her tube feedings and not eating and drinking well at all.  Dehydration in itself can cause confusion (delirium).  I suspected this was the case, NOT a UTI.  Unfortunately, when urine is tested in dehydrated patients, they tend to be colonized with bacteria.  There is a set of guidelines available about when it's appropriate to check for UTIs to avoid unnecessary treatment (McGeer criteria).  Without other signs/symptoms besides confusion, a urine is not to be checked.

## 2016-03-16 NOTE — Telephone Encounter (Signed)
Daughter wanting to know the dates patient was treated and per daughter pt wasn't really dehydrated only for about 5 days. Per daughter she still would like to get pt's urine checked.

## 2016-03-16 NOTE — Telephone Encounter (Signed)
Ok, I will have the independent living nurse go evaluate her and obtain UA c+s with proper clean catch specimen AFTER pt drinks 6 glasses of water for 24 hrs.

## 2016-03-18 NOTE — Telephone Encounter (Signed)
Spoke with bernadette and she will obtain urine from patient and send for UA and culture, per bernadette she will try her best to obtain a clean catch

## 2016-03-23 ENCOUNTER — Telehealth: Payer: Self-pay | Admitting: *Deleted

## 2016-03-23 MED ORDER — CIPROFLOXACIN HCL 500 MG PO TABS: 500.0000 mg | ORAL_TABLET | Freq: Two times a day (BID) | ORAL | Status: AC

## 2016-03-23 NOTE — Telephone Encounter (Signed)
Per handwritten note on lab results" likely developed infection due to inadequate fluid intake, needs this regularly monitored, i want her to use a bottle or pitcher that measures how much she drinks"  Cipro 500 bid x7days  No repeat  Spoke with patient and advised results, she understood and rx sent to pharmacy

## 2016-03-24 ENCOUNTER — Telehealth: Payer: Self-pay | Admitting: *Deleted

## 2016-03-24 ENCOUNTER — Encounter: Payer: Self-pay | Admitting: Adult Health

## 2016-03-24 ENCOUNTER — Ambulatory Visit (HOSPITAL_BASED_OUTPATIENT_CLINIC_OR_DEPARTMENT_OTHER): Payer: Medicare Other | Admitting: Adult Health

## 2016-03-24 VITALS — BP 167/68 | HR 92 | Temp 97.8°F | Resp 16 | Wt 138.4 lb

## 2016-03-24 DIAGNOSIS — R5381 Other malaise: Secondary | ICD-10-CM | POA: Diagnosis not present

## 2016-03-24 DIAGNOSIS — R269 Unspecified abnormalities of gait and mobility: Secondary | ICD-10-CM

## 2016-03-24 DIAGNOSIS — C01 Malignant neoplasm of base of tongue: Secondary | ICD-10-CM | POA: Diagnosis present

## 2016-03-24 DIAGNOSIS — B370 Candidal stomatitis: Secondary | ICD-10-CM

## 2016-03-24 MED ORDER — NYSTATIN 100000 UNIT/ML MT SUSP: 5.0000 mL | Freq: Four times a day (QID) | OROMUCOSAL | Status: DC

## 2016-03-24 MED FILL — NYSTATIN 100,000 UNITS/ML S: 100000 | 23 days supply | Qty: 473 | Fill #0

## 2016-03-24 NOTE — Telephone Encounter (Signed)
CALLED PATIENT'S DAUGHTER [name] Beckles TO INFORM OF PET, LAB AND FU BEING MOVED UP,LVM FOR A RETURN CALL

## 2016-03-24 NOTE — Patient Instructions (Addendum)
-  We have moved up your PET scan and appt with Dr. Isidore Moos by about 2 weeks.  -Someone from Physical Therapy will contact you for an appt at their office.  -Keep up the great work with your speech/swallowing exercises and your fluoride trays.   -Maintain your bowel regimen with the Senna and/or Miralax as needed.  -Continue your course of Cipro as prescribed by Dr. Mariea Clonts.   Call me with any questions!  Mike Craze, NP [phone]

## 2016-03-24 NOTE — Progress Notes (Signed)
REASON FOR VISIT:  Routine follow-up for head & neck cancer   BRIEF ONCOLOGIC HISTORY:    Malignant neoplasm of base of tongue ([place])   10/12/2015 Imaging CT neck: Base of tongue mass measuring approximately 14 mm. There may be additional tumor extending to the left of this mass.  Malignant adenopathy throughout right neck. Image quality degraded by moderate motion.    10/30/2015 Initial Biopsy Right base of tongue biopsy.  Path revealed mucoepidermoid carcinoma.     10/30/2015 Initial Diagnosis Malignant neoplasm of base of tongue ([place])   11/11/2015 Procedure Flexible fiberoptic laryngoscopy: Large submucosal mass noted at the base of tongue and vallecula on the right, with extension back to the vallecula with a more exophytic component directly in midline, which stopped short of the sulcus of the vallecula wi   11/13/2015 PET scan Irregular hypermetabolic mass in right oropharynx, measuring 3.9 x 3.3 cm, (considerably larger than reported on 10/12/15 CT neck). Hypermetabolic right level 2 and right level 3 LN mets. No distant hypermetabolic metastatic disease.   12/16/2015 - 01/12/2016 Radiation Therapy Aggressive palliative radiaiton Isidore Moos). Base of tongue and upper neck / 46 Gy in 20 fractions.  Right lower neck / 40 Gy in 20 fractions     INTERVAL HISTORY:  Ms. Browell reports feeling "okay" since her last visit here at the cancer center about 1 month ago.  She stopped taking the Remeron for appetite/mood because she fell and attributed this medication to the fall.  Her physician at [facility] felt the reason for the fall was dehydration, but the patient was adamant that she not resume the Remeron.  She tells me that her urine was recently checked and she was found to have a UTI. She was started on Cipro and had her first dose last night.  Denies any fever or chills. Reports 1 episode of night sweats, but this doesn't happen regularly.  Her bowels are moving regularly, but she says that  this "comes & goes" with constipation and diarrhea.  She reports that her legs feel weaker and she has started walking with a walker; "It's my security blanket because I don't feel like my legs work as well as they used to."  She is very concerned that she continues to have a "lump" in her neck that has not gone away since completing treatment. She is anxious about what her PET scan will show in a few weeks.  Her daughter, Warren Lacy, is with her in clinic today.   -Pain: Well-controlled with Tylenol during the day and 1 dose of Tramadol before bed daily; having some burning tongue pain, as well as pain that radiates up to her right ear/jaw.  -Nutrition/Weight: Tube feedings every 3 hours; she is very disciplined about keeping her feeding schedule; weight stable, she is weighed weekly at [facility] -Appetite: Still struggling with appetite and po intake; "I just don't want it; I may eat 1 bite of something and I don't want anymore"; taste is slowly improving-she is able to taste chocolate, coffee ice cream, and banana very well. -Swallowing: Denies dysphagia; still doing swallow exercises/working with Speech Therapist at [facility] -Dental: Saw Kulinski in 01/2016 and was discharged to primary care dentist; doing fluoride trays every night.     ADDITIONAL REVIEW OF SYSTEMS:  Review of Systems  Constitutional: Negative for fever and chills.  HENT:       -Right tongue/mouth pain that radiates up to right ear & jaw -Thick saliva, but improving  Cardiovascular:  Negative for leg swelling.  Gastrointestinal: Negative for vomiting.       -Chronic constipation; having regular BMs with help of Senna, which recently caused diarrhea.  LBM today.   Genitourinary: Positive for frequency. Negative for dysuria and hematuria.       -chronic urinary incontinence; this is frustrating for her, particularly in light of recent diagnosis of UTI when she is now forcing more water intake.   Neurological: Negative for  dizziness.  Psychiatric/Behavioral: The patient is nervous/anxious.        -"I don't feel depressed, but I don't feel joyful either."  -She is anxious about upcoming PET scan and "lump" in her neck.      CURRENT MEDICATIONS:  Current Outpatient Prescriptions on File Prior to Visit  Medication Sig Dispense Refill  . acetaminophen (TYLENOL) 500 MG tablet Take 1 tablet (500 mg total) by mouth every 6 (six) hours as needed for mild pain. May take 1-2 tablets. Do not exceed 4000 mg per day. 120 tablet 5  . cholecalciferol (VITAMIN D) 1000 UNITS tablet Take 2,000 Units by mouth daily.    . ciprofloxacin (CIPRO) 500 MG tablet Take 1 tablet (500 mg total) by mouth 2 (two) times daily. 14 tablet 0  . levothyroxine (SYNTHROID, LEVOTHROID) 75 MCG tablet TAKE 1 TABLET BY MOUTH EVERY MORNING 90 tablet 1  . lidocaine (XYLOCAINE) 2 % solution Caregiver:Mix 1part 2% viscous lidocaine, 1part H20. Swish and/or swallow 51mL of this mixture, 34min before meals and at bedtime, up to QID 100 mL 5  . Nutritional Supplements (FEEDING SUPPLEMENT, OSMOLITE 1.5 CAL,) LIQD Begin 120 cc Osmolite 1.5 QID with 60 cc free water before and after each feeding. Day 3, increase to 180 cc Osmolite 1.5 as tolerated. Day 5 increase to 240 cc Osmolite 1.5 as tolerated. Day 7, increase to 240 cc Osmolite 1.5 5 times daily. Flush or drink an additional 240 cc free water daily. Please send feedings and supplies. 5 Bottle 0  . polyethylene glycol (MIRALAX / GLYCOLAX) packet 17 g by PEG Tube route daily. Flush with 30cc's before and after administering miralax    . predniSONE (DELTASONE) 20 MG tablet Take 5 mg by mouth daily with breakfast.    . senna (SENOKOT) 8.6 MG TABS tablet Take 2 tablets (17.2 mg total) by mouth at bedtime. May take every other day if stools become loose. 60 each 5  . traMADol (ULTRAM) 50 MG tablet Take 75 mg by mouth 2 (two) times daily.      No current facility-administered medications on file prior to visit.     ALLERGIES:  Allergies  Allergen Reactions  . Sulfa Antibiotics   . Ampicillin Swelling  . Chlorine   . Codeine   . Macrobid [Nitrofurantoin Monohydrate Macrocrystals]   . Statins   . Trimethoprim      PHYSICAL EXAM:  Filed Vitals:   03/24/16 1346  BP: 167/68  Pulse: 92  Temp: 97.8 F (36.6 C)  Resp: 16      Weight Date  138 lb 6.4 oz (62.778 kg) 03/24/16  139 lb 4.8 oz (63.186 kg) 02/22/16  135 lb 11.2 oz (61.553 kg) 01/25/16  133 lb 9.6 oz (60.601 kg) 12/28/15  138 lb 12.8 oz (62.959 kg) 12/24/15  138 lb 11.2 oz (62.914 kg) 12/21/15  140 lb 3.2 oz (63.594 kg) 12/14/15  142 lb 8 oz (64.638 kg) 12/08/15  143 lb 3.2 oz (64.955 kg) Pre-treatment: 12/04/15   General: Elderly female in no acute distress.  Accompanied in clinic today by her daughter.  HEENT: Head is normocephalic.  Pupils equal and reactive to light. Conjunctivae clear without exudate.  Sclerae anicteric. Oral mucosa is dry; saliva mildly ropey. Tongue is dry and furry with yellow coating. (+) thrush.  Posterior oropharynx with mild thrush and healing mucositis. No other lesions or masses appreciated.  Lymph: There is a palpable ~1cm, round node located in the right submandibular region, which feels slightly smaller than on previous exam. Mild fibrosis noted to right sternocleidomastoid. No other palpable adenopathy noted in the cervical, supraclavicular, or infraclavicular regions.  Cardiovascular: Regular rate and rhythm.  Respiratory: Clear to auscultation bilaterally. Breathing non-labored.   GI: Abdomen soft and round. Non-tender.  Normoactive bowel sounds. G-tube in place with scant yellow drainage that appears normal; encouraged pt to apply Neosporin to insertion site, as needed.  GU: Deferred.   Neuro: No focal deficits. Slow gait with walker.   Psych: Slightly flat affect. Extremities: No edema. Skin: Warm and dry.    LABORATORY DATA:  None at this visit.  DIAGNOSTIC IMAGING:  None at this visit.     ASSESSMENT & PLAN:  Ms. Troxler is a very pleasant 82 y.o. 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   1. Cancer of the right base of tongue: Ms. Forcucci is continuing to recover from palliative radiation therapy.  She continues to have palpable right submandibular lymph node, which is slightly smaller than last month's physical exam. The patient is very anxious about her upcoming PET scan.  Given that she continues to have palpable adenopathy, I have requested that her PET scan and subsequent follow-up with Dr. Isidore Moos be moved up by 2 weeks.  This lymphadenopathy could still represent inflammation, but she remains anxious.  I reviewed with the patient and her daughter that our goal from the beginning of treatment was aggressive palliation/cancer control, and the PET scan will provide [place] with information on the response of the cancer to treatment.  Ms. Heinsohn is pleased with this plan.   2. Oral candidiasis:  Ms. Kendrick appears to have some mild thrush on exam today.  She is currently taking Cipro, which has a potential interaction with Diflucan causing prolonged QTC. Therefore, I will treat her with local Nystatin solution therapy for about 2 weeks.  I e-prescribed Nystatin 100,000 units/ml, take 5 ml po QID, swish and spit, #455ml, no refills.  I explained to the patient and her daughter that thrush is very common in patients treated for head & neck cancer.    3. UTI: Continue course of Cipro as prescribed by PCP, Dr. Hollace Kinnier. Encouraged patient to take all antibiotics as prescribed.  She is asymptomatic and afebrile.   4. Physical deconditioning/Gait disturbance:  Ms. Duskey expresses concerns regarding leg weakness and deconditioning since she completed treatment.  She uses a walker now for additional support.  She did have a fall  several weeks ago, but she endorses that she does not use the walker because she is afraid she will fall again; she is more concerned "that my legs don't work as well anymore and I don't pick up my feet like I used to."  We discussed that with her weight loss, although relatively minor, she likely experienced some muscle wasting/atrophy during that time.  Her physical therapist at [facility] discharged her and encouraged her to continue to  walk more with her walker around the facility.  The patient and her daughter would like additional therapy.  I think she could potentially gain more benefit from therapy, which may further increase her confidence and help her gain more independence in ambulating without the walker.  I have placed a referral to our Outpatient Cancer Rehab program. She understands they will contact her for this appointment.   5. Adjustment disorder with depressed mood: Ms. Becks is continuing to struggle with coping from her illness.  This is quite common in cancer survivors after they complete treatment and have time to begin to process their emotions.  She endorses having little motivation to do things because of her current situation.  We have discussed at previous visits, that it seems that Ms. Barbour is experiencing some symptoms of grief; grief over the change in her health, loss of independence, etc.  Today, she was able to betterable to better verbalize and identify that grief may be the source of her current sadness.  She stated that she finds it frustrating when people tell her "You look so great" because she thinks "But why don't I feel great?"  I validated her concerns and we further explored her feelings.  I provided support today through active listening, validation/normalization of concerns, and expressive supportive counseling.  I think she would benefit from more structured counseling and support.  We again discussed the opportunity for her to work with our oncology  chaplain, Lorrin Jackson, [facility]. Lattie Haw specializes in grief counseling.  Ms. Dalton is interested in initially speaking with Lattie Haw over the phone, as she does not want any additional appointments right now.  Perhaps as she gets to know Lattie Haw better, she may agree to more intensive therapy/counseling.  I am hopeful Lattie Haw will be able to provide the patient meaningful support during this time of continued healing for the patient.    6. Decreased appetite: This is likely multifactorial given Ms. Armijo's recent radiation therapy, dysgeusia, and xerostomia.  Her advanced age may also be contributing to her decreased appetite.  However, her weight remains stable, which is encouraging.  She is very frustrated by her slow progress as it relates to nutrition.  I encouraged her to continue to try different foods by mouth, as tolerated.  She is adamant about not trying Remeron again.  I will make Dory Peru, RD aware so that she may follow-up with the patient at her discretion.    7. Pain:  Her pain is well-controlled on her current regimen on Tylenol during the day and 1 dose of Tramadol at night. She does report some radiating right ear/jaw pain, which may potentially be related to some facial nerve inflammation s/p radiation treatment to her head and neck. We discussed that this is a common complaint of patients after treatment and will likely continue to improve with time. I encouraged her to continue the Tylenol and Tramadol as needed for pain.  8. Constipation: Ms. Passalaqua has chronic issues with constipation.  She self-titrates her bowel regimen medication, Senna, when she feels like she is constipated. This has been effective for her, but sometimes leads to diarrhea.  I encouraged her to restart the Miralax, as this may be a more gentle daily laxative to prevent constipation, particularly while she is still using pain medications. She can skip doses of the Miralax if her stools become loose and she verbalized  understanding.  She agreed with this plan.     I encouraged Ms. Odens and her daughter  to ask questions regarding her above plan of care. All questions were answered to her stated satisfaction.  A written copy of the After Visit Summary with the plan of care was provided to the patient with an extra copy to share with the [facility] staff.  They know to call me with additional questions.    Dispo:  -Of note: Code Status-DNR -PET scan rescheduled for 04/06/16. She will then see Dr. Isidore Moos on 04/08/16. -I will ask Lorrin Jackson, [facility] to contact the patient for grief counseling/additional support.       A total of 60 minutes was spent with this patient in face-to-face care, with greater than 50% of that time in counseling and care-coordination.  Mike Craze, NP [place] [phone]

## 2016-03-25 ENCOUNTER — Telehealth: Payer: Self-pay | Admitting: Adult Health

## 2016-03-25 ENCOUNTER — Encounter: Payer: Self-pay | Admitting: General Practice

## 2016-03-25 NOTE — Telephone Encounter (Signed)
I received a call from [name], Ms. Hoshino's daughter, with an update about her mom.  She tells me that her mother called her earlier today to tell her that "she finally feels like herself again."  [name] attributes much of this to the Cipro "kicking in" for treatment of a recently diagnosed UTI.  She expressed gratitude for our visit yesterday.   I returned [name]'s call and thanked her for keeping me updated about her mom.  I wished them both well and encouraged her to call me with any questions/concerns.   Mike Craze, NP [place] [phone]

## 2016-03-25 NOTE — Progress Notes (Signed)
Spiritual Care Note  Originally referred by [facility], have now also received referral for grief counseling from [facility].  Met Ms Blethen in person in [place]'s office and have followed up via dtr [name].  Phoned pt today to arrange support conversations (per referral from Mike Craze, pt was open to phone conversations, but did not want to add additional on-site appts at this time).  When I asked, "May I please speak with [name]," she replied, "No, you may not," and hung up the phone.  Sending handwritten note with encouragement to reach out by phone as she desires.  [place], [place], [facility] Pager [phone] Voicemail [phone]

## 2016-03-28 ENCOUNTER — Telehealth: Payer: Self-pay | Admitting: Nurse Practitioner

## 2016-03-28 NOTE — Telephone Encounter (Signed)
Received call from patient.  PET scan originally scheduled for 04/21/16 had been moved up last week to 04/06/16. Pt to see Dr. Isidore Moos 04/08/16 to discuss results.  Needs lab appointment moved up as well (currently scheduled for time following appointment with Dr. Isidore Moos).  Will cancel other lab appointments and enter POF for new lab time on 04/06/16 at 1145 (following her PET scan at 1000 as she says that she cannot come prior to her PET scan check in time).  Pt without questions at this time.  Will route to Mike Craze, NP so that she is aware.

## 2016-03-30 NOTE — Telephone Encounter (Signed)
Thanks, Nira Conn!  I think just the TSH will be fine.    Elzie Rings

## 2016-04-01 ENCOUNTER — Other Ambulatory Visit: Payer: Self-pay | Admitting: Nurse Practitioner

## 2016-04-01 ENCOUNTER — Telehealth: Payer: Self-pay | Admitting: Nurse Practitioner

## 2016-04-01 DIAGNOSIS — B370 Candidal stomatitis: Secondary | ICD-10-CM

## 2016-04-01 MED ORDER — FLUCONAZOLE 100 MG PO TABS: 100.0000 mg | ORAL_TABLET | Freq: Every day | ORAL | Status: DC

## 2016-04-01 NOTE — Telephone Encounter (Signed)
Received call from patient's nurse at [facility] (pt could be heard in background) in [name]'s absence. Ms. Snuffer with continued thrush with no response to nystatin swish and swallow x 9 days. Pt states that she is able to swallow pills. Reviewed medical chart including medication list and see no contraindication for fluconazole.  Therefore, changed therapy to fluconazole 200 mg x 1; then begin 100 mg daily.  Asked patient to report if symptoms do not improve or worsen in any way.  Pt voiced understanding. No additional questions per pt or [facility] nurse. Mike Craze, NP notified via chart message.

## 2016-04-05 ENCOUNTER — Telehealth: Payer: Self-pay | Admitting: Adult Health

## 2016-04-05 NOTE — Progress Notes (Signed)
Ms. Buxbaum presents for follow up of radiation completed 01/12/2016 to her Base of Tongue, upper neck, and Right Lower neck.   Pain issues, if any: She reports pain a 1/10 in her Right Ear, which she states is a referral pain from her Tongue Using a feeding tube?: Yes, she instills 5 cans of osmolite daily. She flushes with 120 cc's of water with each feeding.  Weight changes, if any:  Wt Readings from Last 3 Encounters:  04/08/16 135 lb 14.4 oz (61.644 kg)  03/24/16 138 lb 6.4 oz (62.778 kg)  02/23/16 134 lb (60.782 kg)   Swallowing issues, if any: She is not able to swallow solid foods. She states her mouth "rejects" solid foods. She is eating soft foods, like ice cream. She is drinking an occasional boost, but not daily. She is sipping on water all day, and feels like it is about 14 ounces daily.  Smoking or chewing tobacco? No Using fluoride trays daily? She is using the fluoride trays every night. Last ENT visit was on: Not since diagnosis.  Other notable issues, if any: She is using the baking soda rinses, and biotine rinses to help with her dry mouth and throat.  She recently had a UTI which was treated with antibiotics.  She has a Marine scientist at [facility] weekly for a vital sign, and weight check.   BP 130/48 mmHg  Pulse 89  Temp(Src) 97.6 F (36.4 C)  Ht 5\' 2"  (1.575 m)  Wt 135 lb 14.4 oz (61.644 kg)  BMI 24.85 kg/m2  SpO2 99%

## 2016-04-05 NOTE — Telephone Encounter (Signed)
I received a voicemail from [name] while I was out last week on vacation and my colleague, Chestine Spore, NP helped address [name]'s concerns re: thrush.   I called [name] to follow-up today on how she is feeling since starting the Diflucan.  She tells me that she saw her facility nurse today, Mliss Sax, who told her that her mouth/tongue look much better since starting the Diflucan.  The nystatin suspension did not work for her at all (Diflucan was not initially prescribed for the patient because she was on Cipro for a UTI and there is risk of prolonged QTc when these 2 drugs are taken together).  [name] tells me that she has completed the course of Cipro now and started the Diflucan on 04/01/16.  She had questions about how long she should take the medication.  I encouraged her to take a 2-week course, so her last pill would be on 04/15/16; we could always re-evaluate and if she needed further therapy we could address it at that time, although I'm not sure that will be necessary given her improved clinical response per her nurse.    She tells me that "today has been a bad day because I just haven't felt good."  She endorses feeling tired and having 1 bout of diarrhea.  She expresses frustration in her continued healing and the how slow it feels that her healing is occuring.  She endorses that she is nervous about the PET scan tomorrow, but she is not aware of any physical complaints as it relates to her anxiety.   I offered support and encouragement, helping her acknowledge how far she has come since completing treatment. I reminded her that her healing process will be full of "good days" and "bad days", with more good days to come as she continues to heal.  She offered understanding and appreciation for my support.   I will do my best to be there for her visit with Dr. Isidore Moos on Friday, 04/08/16.  She knows to call me with any other questions or concerns.   Mike Craze,  NP [place] [phone]

## 2016-04-06 ENCOUNTER — Encounter (HOSPITAL_COMMUNITY)
Admission: RE | Admit: 2016-04-06 | Discharge: 2016-04-06 | Disposition: A | Discharge status: Home / Self Care | Payer: Medicare Other | Source: Ambulatory Visit | Attending: Radiation Oncology | Admitting: Radiation Oncology

## 2016-04-06 ENCOUNTER — Other Ambulatory Visit (HOSPITAL_BASED_OUTPATIENT_CLINIC_OR_DEPARTMENT_OTHER): Payer: Medicare Other

## 2016-04-06 ENCOUNTER — Encounter: Payer: Self-pay | Admitting: Radiation Oncology

## 2016-04-06 ENCOUNTER — Encounter: Payer: Medicare Other | Admitting: Internal Medicine

## 2016-04-06 DIAGNOSIS — C01 Malignant neoplasm of base of tongue: Secondary | ICD-10-CM | POA: Diagnosis not present

## 2016-04-06 DIAGNOSIS — R634 Abnormal weight loss: Secondary | ICD-10-CM

## 2016-04-06 LAB — COMPREHENSIVE METABOLIC PANEL
ALBUMIN: 3.7 g/dL (ref 3.5–5.0)
ALT: 17 U/L (ref 0–55)
AST: 20 U/L (ref 5–34)
Alkaline Phosphatase: 79 U/L (ref 40–150)
Anion Gap: 8 mEq/L (ref 3–11)
BUN: 27.5 mg/dL — AB (ref 7.0–26.0)
CHLORIDE: 102 meq/L (ref 98–109)
CO2: 29 mEq/L (ref 22–29)
CREATININE: 0.9 mg/dL (ref 0.6–1.1)
Calcium: 10.3 mg/dL (ref 8.4–10.4)
EGFR: 54 mL/min/{1.73_m2} — ABNORMAL LOW (ref >90)
GLUCOSE: 84 mg/dL (ref 70–140)
POTASSIUM: 4.3 meq/L (ref 3.5–5.1)
SODIUM: 139 meq/L (ref 136–145)
Total Bilirubin: 1.07 mg/dL (ref 0.20–1.20)
Total Protein: 7.5 g/dL (ref 6.4–8.3)

## 2016-04-06 LAB — MAGNESIUM: Magnesium: 2.2 mg/dl (ref 1.5–2.5)

## 2016-04-06 LAB — TSH: TSH: 1.609 m(IU)/L (ref 0.308–3.960)

## 2016-04-06 LAB — GLUCOSE, CAPILLARY: Glucose-Capillary: 99 mg/dL (ref 65–99)

## 2016-04-06 MED ORDER — FLUDEOXYGLUCOSE F - 18 (FDG) INJECTION
5.7000 | Freq: Once | INTRAVENOUS | Status: AC | PRN
  Administered 2016-04-06: 5.7 via INTRAVENOUS

## 2016-04-07 LAB — PHOSPHORUS: PHOSPHORUS: 3.6 mg/dL (ref 2.5–4.5)

## 2016-04-07 NOTE — Progress Notes (Signed)
Radiation Oncology         [phone] ________________________________  Name: [name] MRN: [id number]  Date: 04/08/2016  DOB: 23-Feb-1924  Follow-Up Visit Note  CC: REED, [name], DO  Reed, [name] L, DO  Diagnosis and Prior Radiotherapy:       ICD-9-CM ICD-10-CM   1. 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 Base of tongue and upper neck  / 46 Gy in 20 fractions                        2. Right lower neck / 40 Gy in 20 fractions                        Narrative: The patient returns to today discuss 04/06/16 PET scan results. Results indicate modest improvement in the base of tongue tumor in response to radiation therapy but there is still some clearly abnormal tissue that is hypermetabolic. While some of the right neck nodes have improved, there is a notable level 1 right neck node that has increased in size and a new left low jugular node that is hypermetabolic. There is an additional opacity in the right upper lobe, this appears to be less consistent with tumor in my opinion.   On today's visit, the patient reports she has been feeling weak. Her energy is low throughout the day making it difficult to keep up with daily activities. She reports 3/10 pain in her right ear, which she states is referral pain from her tongue, this pain is not new . She is using a feeding tube and instills 5 cans of osmolite daily, she flushes with 120 cc's of water with each feeding. She is able to swallow foods now however most food still tastes terrible. She is eating soft foods, like ice cream and is keeping a log of foods that taste good. She is drinking an occasional boost, but not daily. She is sipping on water all day and feels like it is about 14 ounces daily. She is using the baking  soda rinses, and biotine rinses to help with her dry mouth and throat. She is using her fluoride trays every night. She recently had a UTI which was treated with antibiotics. She has a Marine scientist at [facility] weekly for a vital sign, and weight check.    ALLERGIES:  is allergic to sulfa antibiotics; ampicillin; chlorine; codeine; macrobid; statins; and trimethoprim.  Meds: Current Outpatient Prescriptions  Medication Sig Dispense Refill  . acetaminophen (TYLENOL) 500 MG tablet Take 1 tablet (500 mg total) by mouth every 6 (six) hours as needed for mild pain. May take 1-2 tablets. Do not exceed 4000 mg per day. 120 tablet 5  . cholecalciferol (VITAMIN D) 1000 UNITS tablet Take 2,000 Units by mouth daily.    . fluconazole (DIFLUCAN) 100 MG tablet Take 1 tablet (100 mg total) by mouth daily. Take 2 tablets by mouth today; then begin 1 tablet daily 31 tablet 0  . levothyroxine (SYNTHROID, LEVOTHROID) 75 MCG tablet TAKE 1 TABLET BY MOUTH EVERY MORNING 90 tablet 1  . Nutritional Supplements (FEEDING SUPPLEMENT, OSMOLITE 1.5 CAL,) LIQD Begin 120  cc Osmolite 1.5 QID with 60 cc free water before and after each feeding. Day 3, increase to 180 cc Osmolite 1.5 as tolerated. Day 5 increase to 240 cc Osmolite 1.5 as tolerated. Day 7, increase to 240 cc Osmolite 1.5 5 times daily. Flush or drink an additional 240 cc free water daily. Please send feedings and supplies. 5 Bottle 0  . polyethylene glycol (MIRALAX / GLYCOLAX) packet 17 g by PEG Tube route daily. Reported on 04/08/2016    . senna (SENOKOT) 8.6 MG TABS tablet Take 2 tablets (17.2 mg total) by mouth at bedtime. May take every other day if stools become loose. 60 each 5  . lidocaine (XYLOCAINE) 2 % solution Caregiver:Mix 1part 2% viscous lidocaine, 1part H20. Swish and/or swallow 1mL of this mixture, before meals and at bedtime, up to QID (Patient not taking: Reported on 04/08/2016) 100 mL 5  . nystatin (MYCOSTATIN) 100000 UNIT/ML suspension Take 5 mLs  (500,000 Units total) by mouth 4 (four) times daily. (Patient not taking: Reported on 04/08/2016) 473 mL 0  . predniSONE (DELTASONE) 20 MG tablet Take 5 mg by mouth daily with breakfast. Reported on 04/08/2016    . traMADol (ULTRAM) 50 MG tablet Take 75 mg by mouth 2 (two) times daily. Reported on 04/08/2016     No current facility-administered medications for this encounter.    Physical Findings: The patient is in no acute distress. Patient is alert and oriented. Wt Readings from Last 3 Encounters:  04/08/16 135 lb 14.4 oz (61.644 kg)  03/24/16 138 lb 6.4 oz (62.778 kg)  02/23/16 134 lb (60.782 kg)    height is 5\' 2"  (1.575 m) and weight is 135 lb 14.4 oz (61.644 kg). Her temperature is 97.6 F (36.4 C). Her blood pressure is 130/48 and her pulse is 89. Her oxygen saturation is 99%. .  General: Alert and oriented, in no acute distress Heart: RRR, no murmurs Lungs: CTAB HEENT: Head is normocephalic. Erythrema of superior right tympanic membrane. Left tympanic membrane is partially occluded by cerumen. No obvious masses in oropharynx or mouth Neck: Slightly enlarged mass in submandibular region about 1.5 cm. Mildly to moderate fullness in right neck along cervical region. Skin has healed well in the treatment fields. No obvious supraclavicular or cervical nodes, otherwise, bilaterally. Psychiatric: Judgment and insight are intact. Affect is appropriate.  Lab Findings: Lab Results  Component Value Date   WBC 9.1 12/30/2015   HGB 13.4 12/30/2015   HCT 42.2 12/30/2015   MCV 99.3 12/30/2015   PLT 382 12/30/2015    Lab Results  Component Value Date   TSH 1.609 04/06/2016    Radiographic Findings: Nm Pet Image Restag (ps) Skull Base To Thigh  04/06/2016  CLINICAL DATA:  Subsequent treatment strategy for tongue cancer. EXAM: NUCLEAR MEDICINE PET SKULL BASE TO THIGH TECHNIQUE: 5.7 mCi F-18 FDG was injected intravenously. Full-ring PET imaging was performed from the skull base to thigh  after the radiotracer. CT data was obtained and used for attenuation correction and anatomic localization. FASTING BLOOD GLUCOSE:  Value: 99 mg/dl COMPARISON:  04/08/2016 and CT neck 10/12/2015. FINDINGS: NECK Right tongue base mass measures approximately 1.8 x 2.7 cm (CT image 24), previously 3.9 x 3.3 cm, with an SUV max of 11.7. There are hypermetabolic right level 2, level 3 and right submandibular lymph nodes. Index right level-II lymph node measures 13 mm in short axis (image 22) with an SUV max of 5.3, stable. There is opacification of the right sphenoid sinus.  CHEST A necrotic low left internal jugular lymph node measures 1.4 cm in short axis (image 42), new from 11/13/2015, with an SUV max 6.6. No additional hypermetabolic mediastinal, hilar or axillary lymph nodes. New nodular consolidation in the apical segment right upper lobe (series 8, image 11) has an SUV max of 3.7. No additional hypermetabolic pulmonary nodules. CT images show coronary artery calcification. No pericardial or pleural effusion. ABDOMEN/PELVIS No abnormal hypermetabolism in the liver, adrenal glands, spleen or pancreas. Liver, gallbladder, adrenal glands and right kidney are unremarkable. Low-attenuation mass in the lower pole left kidney measures 6.3 cm, stable and likely a cyst although definitive characterization is limited without post-contrast imaging. Spleen and pancreas are unremarkable. Percutaneous gastrostomy tube in place. No free fluid. SKELETON Marked decrease in previously-seen hypermetabolism associated with the left aspect of the sacrum, which is now somewhat amorphous least sclerotic. No additional abnormal osseous hypermetabolism. Degenerative changes are seen in the spine. IMPRESSION: 1. Hypermetabolic right tongue base mass with persistent hypermetabolic adenopathy in the right neck and right submandibular region. New hypermetabolic necrotic lymph node in the low left internal jugular station is indicative of  disease progression. 2. Somewhat nodular appearing consolidation in the apical segment right upper lobe, new from 51/76/1607 and hypermetabolic. This could be an infectious/ inflammatory lesion or metastatic. Continued attention on followup exams is warranted. 3. Relative lack of FDG avidity the left sacrum when compared with the prior exam, with somewhat amorphous sclerosis, indicative of a healing or healed insufficiency fracture. Electronically Signed   By: Lorin Picket M.D.   On: 04/06/2016 12:33    Impression/Plan:    1) Head and Neck Cancer Status: healing from RT w/ clinical response. However, PET scan now shows evidence of disease progression in nodal regions that were outside the palliative RT fields. Her disease will be chronic and is not curable.  2) Nutritional Status: stable weight  PEG tube: 3 hour feeding schedule  See #7  3) Swallowing: she is able to swallow soft foods however most foods still taste bad and therefore still uses her feeding tube   4) Dental: Encouraged to continue regular followup with dentistry, and dental hygiene .   5) Thyroid function:  Lab Results  Component Value Date   TSH 1.609 04/06/2016    6) Other: If she desires, I advised her to revisit her surgeon at [facility] to review her current case. I do not think, however, that she is a good surgical candidate.  She is not interested in surgery or chemotherapy, either.  Her quality of life is poor but I do not anticipate that she necessarily has <6 mo to live.  I do not see a good role for radiotherapy right now to treat the relatively asymptomatic nodes in her neck.  I'd like to maximize her QOL with best supportive care.  I will look into whether palliative care visits by [facility] for palliative care, rather than hospice, is an option   Emotional health - Lorrin Jackson, Chaplain, will visit with patient today.   7) Follow up in 1 month with Elzie Rings of survivorship scheduled for July 10th, 11:30  am. The patient has met with our chaplain today. The patient also met with Elzie Rings today. I will contact Ernestene Kiel to reach out to discuss her feeding schedule (addendum: daughter declines nutrition followup; they will continue to follow with [facility] nutritionist).      _____________________________________   [name], MD  This document serves as a record of  services personally performed by [name], MD. It was created on her behalf by Derek Mound, a trained medical scribe. The creation of this record is based on the scribe's personal observations and the provider's statements to them. This document has been checked and approved by the attending provider.

## 2016-04-08 ENCOUNTER — Ambulatory Visit
Admission: RE | Admit: 2016-04-08 | Discharge: 2016-04-08 | Disposition: A | Discharge status: Home / Self Care | Payer: Medicare Other | Source: Ambulatory Visit | Attending: Radiation Oncology | Admitting: Radiation Oncology

## 2016-04-08 ENCOUNTER — Encounter: Payer: Self-pay | Admitting: *Deleted

## 2016-04-08 ENCOUNTER — Ambulatory Visit: Payer: Medicare Other

## 2016-04-08 ENCOUNTER — Encounter: Payer: Self-pay | Admitting: Radiation Oncology

## 2016-04-08 ENCOUNTER — Encounter: Payer: Self-pay | Admitting: General Practice

## 2016-04-08 VITALS — BP 130/48 | HR 89 | Temp 97.6°F | Ht 62.0 in | Wt 135.9 lb

## 2016-04-08 DIAGNOSIS — C01 Malignant neoplasm of base of tongue: Secondary | ICD-10-CM | POA: Insufficient documentation

## 2016-04-08 DIAGNOSIS — R1310 Dysphagia, unspecified: Secondary | ICD-10-CM | POA: Insufficient documentation

## 2016-04-08 HISTORY — DX: Personal history of irradiation: Z92.3

## 2016-04-08 NOTE — Progress Notes (Signed)
  Oncology Nurse Navigator Documentation  Navigator Location: [facility] (04/08/16 1700) Navigator Encounter Type: Clinic/MDC (04/08/16 1700)           Patient Visit Type: WZDVIP (04/08/16 1700) Treatment Phase: Post-Tx Follow-up (04/08/16 1700)     Interventions: Other (04/08/16 1700)       To provide support and care continuity, met with [name] and dtr [name] during follow-up with Dr. Isidore Moos including discussion of 6/14 PET. [name] verbalized understanding of information and expressed being overwhelmed. I provided verbal and emotional support. Chaplain Lorrin Jackson arrived and spoke with them following appt with Dr. Isidore Moos.  Gayleen Orem, RN, BSN, [place] [facility] [place] [phone]                    Time Spent with Patient: 45 (04/08/16 1700)

## 2016-04-08 NOTE — Progress Notes (Signed)
Spiritual Care Note  Followed up with [name] and dtr [name] in person following appt with Dr Isidore Moos due to past difficulties reaching [name] by phone.  Today she connected well with me, noting that I was able to put words around what she was thinking and feeling.  She was receptive to pastoral presence and welcomed affirmation/normalization via reflective listening.  Helped [name] identify and sort through many feelings associated with changes in her health status, energy level, and life priorities.  We particularly named and explored her grief at expectations she has had to relinquish, as well as motivators for seeking to live well as long as she can.  Pt identifies that her family is a top priority and source of meaning.  She also desires to move into assisted living at [place] (where she currently lives alone in a [place]), identifying a new internal readiness to release the extra distress of independent living (namely social isolation and fatigue at traversing more space than she needs).  We plan to f/u by phone Monday for further support, esp because pt verbalized several times that she was still stunned by the news of disease progression.  [place], [place], [facility] Pager [phone] Voicemail [phone]

## 2016-04-11 ENCOUNTER — Encounter: Payer: Self-pay | Admitting: Nutrition

## 2016-04-11 NOTE — Progress Notes (Signed)
Daughter called to cancel nutrition appointment. Mother would like to minimize appointments and they have good follow up at [facility] by the RD who is monitoring her TF and weight. Nutrition follow up cancelled.

## 2016-04-13 ENCOUNTER — Encounter (HOSPITAL_COMMUNITY): Payer: Self-pay

## 2016-04-14 ENCOUNTER — Telehealth: Payer: Self-pay | Admitting: Adult Health

## 2016-04-14 NOTE — Telephone Encounter (Signed)
I received a call from [name] with several concerns.  She has been feeling "very down in the dumps" since receiving the news from her recent restaging PET scan last week with Dr. Isidore Moos.   -I reviewed the PET scan results with her again, reiterating that it showed a mixed response (some areas of response to treatment and some new areas of hypermetabolic activity concerning for residual/new disease).   -She tells me that Dr. Isidore Moos recommended after her physical exam and evaluation last week that [name] should complete 1 additional week of Diflucan therapy for the thrush (total of 3 weeks of therapy).  [name] tells me that she thinks the Diflucan has given her a new rash, "red blotches" to her back, stomach, and chest that are very itchy.  She has been applying some hydrocortisone cream to the small areas, with some relief.  We discussed the pros and cons of stopping Diflucan early; she elected to continue with 1 more week to complete the 3-week course. I encouraged her to try some Benadryl OTC cream/ointment to help with what sounds like very small areas of rash and pruritis.  She agreed with this plan.   -She is discouraged by what feels like very slow progress in her healing. "I just feel so bad and I haven't had a good day in a really long time."  Her dry mouth is concerning to her, as well as the ropey saliva.  I let her know that these are normal in her continued healing and she should continue good oral care.  She has not eaten anything by mouth in about 2 weeks, which she attributes to the Diflucan therapy making her mouth feel weird and decrease her taste.  While it is certainly possible for Diflucan to cause some rare adverse events, she seems to also be clinically depressed.   I encouraged her to re-consider the use of Remeron to help with her mood and appetite. She is fearful to try the Remeron again because she associates a recent fall with this medication (although her  medical and nursing staff at [facility] found dehydration and hypotension to be the likely etiology of the fall).  She states that she will consider it and we can discuss at her next visit.  I encouraged her to do her own research and discuss with her daughter and we could revisit this discussion again.    -[name] is unsure if she would like to be seen sooner than her currently scheduled appt on 05/02/16. "I just feel really down and you always help me feel better."  I let her know that I would be happy to see her anytime and if she would like to be seen earlier, we could reschedule her July appt.  She stated that she will keep the 05/02/16 appt as scheduled right now and may call me back later if she changes her mind.  I encouraged her to ask questions and all questions were answered to her stated satisfaction.  She knows to call me with any further needs or concerns.   Mike Craze, NP [place] [phone]

## 2016-04-18 ENCOUNTER — Ambulatory Visit: Payer: Medicare Other | Admitting: Physical Therapy

## 2016-04-18 ENCOUNTER — Telehealth: Payer: Self-pay | Admitting: Adult Health

## 2016-04-18 NOTE — Telephone Encounter (Signed)
I received a call from the patient's daughter, [name], with questions regarding her mom, Ms. Edes.  [name] tells me that she remains concerned about her mom's mental health and about her being so depressed, particularly in light of receiving her PET scan results last week with Dr. Isidore Moos.    [name] wants to know if there is anything else that could be contributing to her mom's depression/anxiety.  She tells me that she has been doing a lot of research online and listening to [place] about cancer survivors and has questions.  She is wondering if her mom is experiencing "existential anxiety" as a cancer survivor and also is wondering if her thyroid could be contributing to her feeling depressed.  I shared that Ms. Suitt's thyroid was checked on 04/06/16 and was normal at that time and that I do not suspect a metabolic cause for her reported anxiety/depression.   I shared with [name] during our lengthy phone call that it is extremely common for patients who have completed cancer treatments to experience anxiety and depression, whether it be situational during the time of recovery; oftentimes patients will also meet the criteria for clinical depression/anxiety as survivors.  I acknowledged her concerns for her mother and relayed that we have both pharmacologic and non-pharmacologic options to help her mom.  I explained that in a phone call last week with the patient that I encouraged Ms. Bremer to reconsider the use of Remeron to help manage her mood and appetite symptoms.  Her daughter states that she will also encourage her mother to reconsider that as well.  I shared that it is not my goal to "force" Ms. Hewell into medications, if she does not want to take them; we are here to provide support in whatever ways are comfortable to the patient and family.  If Ms. Helfand is not willing to take any psychiatric medications, then I strongly recommended counseling either here at the cancer center or with a licensed  therapist in the community.  I let [name] know that often the combination of both medication and counseling can help patients "bounce back" and better cope with their lives after cancer.  However, for patients who choose not to pursue medications, psychotherapy is a wonderful option as well.  I shared that our role at the cancer center is to "meet her where she is" psychologically and provide support during this time of recovery.  I encouraged [name] to reach out to Lorrin Jackson, [facility], as they have previously established some rapport and a relationship and that I would defer to Lisa's professional opinion on how best to proceed, if Ms. Cude agrees to counseling (whether here at the cancer center or in the community).  I am happy to place a referral to an outside facility/provider, if needed.    I encouraged [name] to let me know if Ms. Parreira would like to be seen sooner than 05/02/16; I would do my best to accommodate seeing her early if they desire.  Otherwise, I look forward to seeing them in a few weeks.   I will forward this note to Lorrin Jackson, [facility] and Gayleen Orem, RN to provide them an update of this discussion.   Mike Craze, NP [place] [phone]

## 2016-04-18 NOTE — Telephone Encounter (Signed)
I received a voicemail from Archie Endo, NP with [facility] [phone]) re: [name].  She relayed in her voicemail that the patient is continuing to struggle with thrush and (L) throat pain.  Her neck is sore as well.  Rodena Piety is recommending we prescribe Magic Mouthwash to symptomatically help with [name]'s current thrush symptoms.    After speaking with the patient's daughter, [name], she relayed that she thought her mom had tried magic mouthwash (MM)  in the past and it was not helpful.  I shared that I had prescribed for her to have Nystatin for the thrush, which was not helpful, but I had not prescribed MM. I explained the purpose/rationale for using MM instead of Nystatin in treating the symptoms associated with thrush.  [name] did not see the need for a new prescription. I shared that I would be happy to call this medication in to their pharmacy if they changed their minds.    Mike Craze, NP [place] [phone]

## 2016-04-19 ENCOUNTER — Other Ambulatory Visit: Payer: Self-pay | Admitting: Internal Medicine

## 2016-04-19 ENCOUNTER — Encounter: Payer: Self-pay | Admitting: General Practice

## 2016-04-19 ENCOUNTER — Telehealth: Payer: Self-pay | Admitting: Adult Health

## 2016-04-19 NOTE — Telephone Encounter (Signed)
I received a call from [name] to let me know that her bullous pemphigoid "is acting up again."  She is having quite a bit of pruritis and redness as a result.  She tells me that she spoke with her dermatologist, Dr. Rolm Bookbinder, who asked that the patient verify with her oncology team if it would be safe for her to resume her methotrexate +/- the prednisone.   I shared with [name] that I did not see any contraindication in her resuming the MTX and prednisone; I let the patient know that I would verify with Dr. Isidore Moos.  Dr. Isidore Moos states that it is fine for dermatology to start any medications they need to help manage [name]'s condition.   [name] provided me with a phone number for Dr. Ubaldo Glassing and I left her a voicemail with the above information.  I left my direct number in the voicemail for her to return my call if she has any further questions or concerns.    Mike Craze, NP [place] [phone]

## 2016-04-19 NOTE — Progress Notes (Signed)
Spiritual Care Note  Left Ms Bury a VM of encouragement and support as part of our ongoing emotional-care relationship.  Encouraged her to return call, but will call back if she does not.   [place], [place], [facility] Pager [phone] Voicemail [phone]

## 2016-04-21 ENCOUNTER — Ambulatory Visit: Payer: Medicare Other | Attending: Radiation Oncology | Admitting: Physical Therapy

## 2016-04-21 ENCOUNTER — Ambulatory Visit: Payer: Medicare Other

## 2016-04-21 ENCOUNTER — Inpatient Hospital Stay: Admission: RE | Admit: 2016-04-21 | Payer: Medicare Other | Source: Ambulatory Visit

## 2016-04-21 ENCOUNTER — Other Ambulatory Visit (HOSPITAL_COMMUNITY): Payer: Self-pay

## 2016-04-21 ENCOUNTER — Other Ambulatory Visit: Payer: Medicare Other

## 2016-04-21 DIAGNOSIS — I890 Lymphedema, not elsewhere classified: Secondary | ICD-10-CM

## 2016-04-21 DIAGNOSIS — M6281 Muscle weakness (generalized): Secondary | ICD-10-CM | POA: Diagnosis not present

## 2016-04-21 DIAGNOSIS — R262 Difficulty in walking, not elsewhere classified: Secondary | ICD-10-CM | POA: Insufficient documentation

## 2016-04-21 NOTE — Therapy (Signed)
[place], [place], [address] Phone: [phone]   Fax:  [phone]  Physical Therapy Evaluation  Patient Details  Name: [name] MRN: [id number] Date of Birth: 06-Sep-1924 Referring Provider: Mike Craze  Encounter Date: 04/21/2016      PT End of Session - 04/21/16 1514    Visit Number 1   Number of Visits 7  2x/week for 2 weeks, then one time a week for 2   Date for PT Re-Evaluation 05/21/16   PT Start Time 1310   PT Stop Time 1355   PT Time Calculation (min) 45 min   Activity Tolerance Patient tolerated treatment well   Behavior During Therapy [facility] for tasks assessed/performed      Past Medical History  Diagnosis Date  . Nasal polyps   . Osteoporosis   . Mitral valve disorders   . Chronic rhinitis   . Unspecified urinary incontinence   . Asthma   . Hyperlipidemia   . Hypertension   . Urge incontinence   . Unspecified hypothyroidism   . Bilateral claudication of lower limb ([place]) 08/29/2013    Bilateral posterior thighs  . Aortic atherosclerosis ([place]) 08/29/2013    Identified on Aortic ultrasound exam 2008   . Bullous pemphigoid 02/2015    Dr. Wilhemina Bonito  . Arthritis   . Hx of radiation therapy 12/16/15- 01/12/16    Base of Tongue and upper neck, Right Lower neck    Past Surgical History  Procedure Laterality Date  . Nasal polyp surgery  03/2002    x2 Dr Wilburn Cornelia  . Tonsillectomy    . Total abdominal hysterectomy w/ bilateral salpingoophorectomy  1970's  . Bunionectomy Left 12/1998  . Milk cyst right breast    . Breast lumpectomy Right 1953  . Bunionectomy Right 02/2000  . Eye surgery Bilateral 02/2000    Cataract removal  . Root canal  02/2014  . Direct laryngoscopy N/A 10/30/2015    Procedure: DIRECT LARYNGOSCOPY AND BIOPSY OF TONGUE MASS;  Surgeon: Jerrell Belfast, MD;  Location: [place];  Service: ENT;  Laterality: N/A;    There were no vitals filed for this visit.        Subjective Assessment - 04/21/16 1311    Subjective Pt's daughter is here because she wants to see if there is anything to be done to help with her walking. She has had PT at [place] and has been discharged.    Pertinent History Pt. with right neck mass and tongue and ear pain.  CT scan in December showed mass at base of tonge and indicated malignant nodes.  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  There is neck adenopathy but no evidence of distant metastases.  Urinary incontinence.  h/o osteoporosis, leg claudication. Pt states that the cancer is still there and is growing.  Pt has tube feeidng  Pt also has an autoimmune condition that causes itchy blisters.    Patient Stated Goals daugher wants to see if there is anything to be done with her walking   Pt just wants to feel good. She wants to be able to walk, go out to have dinner and go to the symphony   Currently in Pain? No/denies  but she does have pain in her mouth .    Aggravating Factors  gets worse as day goes on.              [facility] PT Assessment - 04/21/16 0001  Assessment   Medical Diagnosis right base of tongue carcinoma with neck adenopathy   Referring Provider Mike Craze   Onset Date/Surgical Date 11/11/15   Precautions   Precautions None   Restrictions   Weight Bearing Restrictions No   Balance Screen   Has the patient fallen in the past 6 months Yes   How many times? 1   Has the patient had a decrease in activity level because of a fear of falling?  No   Is the patient reluctant to leave their home because of a fear of falling?  No   Home Environment   Living Environment Other (Comment)   Additional Comments [facility] independent living   Prior Function   Level of Independence Independent with basic ADLs   Vocation Retired   Leisure will be moving to an [place]. and has been attending assised living exercise programs likes to read , loves music   Cognition    Overall Cognitive Status Within Functional Limits for tasks assessed   Behaviors Other (comment)  Pt expresses unhappiness with her current state of function    Observation/Other Assessments   Observations Pt comes in with a 2 wheeled rolling walker.  She has foward flexed posture.  She appears to have loose skin underneath her chin that she states she never had before.    Skin Integrity pt has multiple skin"blisters" that she says are from an autoimmune disease.  She is being treated by a dermatologist    Sensation   Light Touch Appears Intact   Additional Comments pt states she has numbness in her feet that comes and goes    Coordination   Gross Motor Movements are Fluid and Coordinated Yes  pt is able to correct posture with verbal cues    Functional Tests   Functional tests Sit to Stand   Sit to Stand   Comments 6 times in 30 seconds (above average for age)   Posture/Postural Control   Posture/Postural Control Postural limitations   Postural Limitations Forward head;Rounded Shoulders  able to improve with cues    ROM / Strength   AROM / PROM / Strength AROM   AROM   Overall AROM  Within functional limits for tasks performed   Overall AROM Comments --   Strength   Overall Strength Deficits   Overall Strength Comments generalized muscle atrophy.  Pt reports about a 16 # weight loss, but has been gaining a little weight with tube feeding.generally 3+/5   Right Hand Grip (lbs) 25,20,20   Left Hand Grip (lbs) 15,15,20   Palpation   Palpation comment soft skin at below chin     Ambulation/Gait   Ambulation/Gait Yes   Ambulation/Gait Assistance 6: Modified independent (Device/Increase time)   Assistive device Rolling walker;Straight cane  full time use    Gait Pattern Decreased step length - right;Decreased step length - left;Shuffle;Trunk flexed   Stairs --   Gait Comments --           LYMPHEDEMA/ONCOLOGY QUESTIONNAIRE - 04/21/16 1347    Head and Neck   4 cm superior to  sternal notch around neck 37 cm   6 cm superior to sternal notch around neck 36 cm   8 cm superior to sternal notch around neck 37 cm                [facility] Adult PT Treatment/Exercise - 04/21/16 0001    Knee/Hip Exercises: Standing   Wall Squat 10 reps   Wall  Squat Limitations encouraged pt to do 10 sets of wall taps after each tube feeding    Other Standing Knee Exercises stance weight shifting.( recommended pt do this to music as she states she loves music)     Other Standing Knee Exercises standing glute sets                         Long Term Clinic Goals - 04/21/16 1524    CC Long Term Goal  #1   Title Patient will be independent in self manual lymph drainage   Time 4   Period Weeks   Status New   CC Long Term Goal  #2   Title Patient will be know how to obtain and use compression garments for maintenance phase of treatment   Time 4   Period Weeks   Status New   CC Long Term Goal  #3   Title Patient will be independent in  home exercise program   Time 4   Period Weeks   Status New   CC Long Term Goal  #4   Title pt will perform 9 sit to stand repetitions in 30 seconds indicating an improvement in general strength   Time 4   Period Weeks   Status New            Plan - 04/21/16 1518    Clinical Impression Statement Pt presents with deconditioning from head and neck cancer treatment. She is using a walker full time and wants to be able to walk  better She also has increase in neck cirumference compared to pre-op visit and reports more skin below her chin then she has ever had.  She was given to exercises to improve her hip strength that she can start today and gave her ideas to help her remember to do them ( do some after she does tube feeding that she reports she remembers to do very well, and to do some with music)    Rehab Potential Good   Clinical Impairments Affecting Rehab Potential radiation for tonsillar cancer    PT Frequency 2x / week    PT Duration 4 weeks   PT Treatment/Interventions ADLs/Self Care Home Management;Manual lymph drainage;Compression bandaging;Patient/family education;Functional mobility training;Therapeutic exercise;Manual techniques;Taping   PT Next Visit Plan Manual lymph drainage with teaching of self treatment with use of mirror.  Issue chip pack Review glute sets, weight shifts and wall taps and add scaption exericse encourage daily walking program with good posture.      Patient will benefit from skilled therapeutic intervention in order to improve the following deficits and impairments:  Decreased activity tolerance, Postural dysfunction, Increased edema, Decreased strength, Decreased balance, Abnormal gait, Difficulty walking  Visit Diagnosis: Muscle weakness (generalized) - Plan: PT plan of care cert/re-cert  Difficulty in walking - Plan: PT plan of care cert/re-cert  Lymphedema, not elsewhere classified - Plan: PT plan of care cert/re-cert      G-Codes - [id number] [id number]    Functional Assessment Tool Used clinical judgement    Functional Limitation Changing and maintaining body position   Changing and Maintaining Body Position Current Status [id number]) At least 40 percent but less than 60 percent impaired, limited or restricted   Changing and Maintaining Body Position Goal Status [id number]) At least 1 percent but less than 20 percent impaired, limited or restricted       Problem List Patient Active Problem List   Diagnosis Date Noted  . Malignant neoplasm  of base of tongue ([place]) 12/07/2015  . Malignant neoplastic disease ([place]) 11/26/2015  . Bullous pemphigoid 07/29/2015  . Aortic atherosclerosis ([place]) 08/29/2013  . Bilateral claudication of lower limb ([place]) 08/29/2013  . Hypothyroidism   . Right hip pain 01/16/2013  . Asthma   . Hyperlipidemia   . Hypertension   . Urge incontinence   . NASAL POLYP 05/24/2010  . RHINITIS 08/04/2007  . Allergic asthma 08/04/2007   [name]. [name]  PT  Norwood Levo 04/21/2016, 3:36 PM  [place], [place], [address] Phone: [phone]   Fax:  [phone]  Name: Temica Nath MRN: [id number] Date of Birth: [birth year]

## 2016-04-22 ENCOUNTER — Telehealth: Payer: Self-pay | Admitting: Adult Health

## 2016-04-22 ENCOUNTER — Encounter: Payer: Self-pay | Admitting: Nutrition

## 2016-04-22 ENCOUNTER — Ambulatory Visit: Payer: Medicare Other | Admitting: Radiation Oncology

## 2016-04-22 NOTE — Telephone Encounter (Signed)
I received a voicemail from [facility], the patient's daughter, asking to reschedule her mother's appt from Monday, 05/02/16 to Friday, 04/29/16 if possible.    I returned the call to [name] to let her know that the appt has been rescheduled to Friday, 04/29/16 at 2:30pm as requested.  [name] tells me that Ms. Spinnato is struggling some with pain, but does not want to take her Tramadol or Tylenol.  [name] would like me to address this with the patient when I see her next week.  I let [name] know that Ms. Robar should have prescriptions for both the Tylenol and Tramadol to take as needed at [facility]; the patient is reluctant to do so for a variety of reasons.  We can certainly address her pain further next week when I see them in clinic.  They know to call me with further needs.   Mike Craze, NP [place] [phone]

## 2016-04-28 NOTE — Progress Notes (Signed)
REASON FOR VISIT:  Routine follow-up for head & neck cancer   BRIEF ONCOLOGIC HISTORY:    Malignant neoplasm of base of tongue ([place])   10/12/2015 Imaging CT neck: Base of tongue mass measuring approximately 14 mm. There may be additional tumor extending to the left of this mass.  Malignant adenopathy throughout right neck. Image quality degraded by moderate motion.    10/30/2015 Initial Biopsy Right base of tongue biopsy.  Path revealed mucoepidermoid carcinoma.     10/30/2015 Initial Diagnosis Malignant neoplasm of base of tongue ([place])   11/11/2015 Procedure Flexible fiberoptic laryngoscopy: Large submucosal mass noted at the base of tongue and vallecula on the right, with extension back to the vallecula with a more exophytic component directly in midline, which stopped short of the sulcus of the vallecula wi   11/13/2015 PET scan Irregular hypermetabolic mass in right oropharynx, measuring 3.9 x 3.3 cm, (considerably larger than reported on 10/12/15 CT neck). Hypermetabolic right level 2 and right level 3 LN mets. No distant hypermetabolic metastatic disease.   12/16/2015 - 01/12/2016 Radiation Therapy Aggressive palliative radiaiton Isidore Moos). Base of tongue and upper neck / 46 Gy in 20 fractions.  Right lower neck / 40 Gy in 20 fractions   04/06/2016 PET scan Hypermetabolic (R) BOT mass with persistent hypermetabolic adenopathy in (R) neck & (R) submandibular region. New necrotic LN in low (L) IJ station c/w disease progression. Nodular appearance in apical segment RUL which could be infection or mets.      INTERVAL HISTORY:  Ms. Toyama reports "feeling lousy" since her last visit at the cancer center.  She has had quite a bit of tongue pain, but does not like to take pain medication.  Her pain radiates up to her (R) ear as well. She stopped taking the Tramadol because it was too constipating; extra-strength Tylenol works well for her and offers relief.  She periodically used Lidocaine in the  past, but "I left it at the skilled nursing area of [facility] and I never got it back."  Her bowels are moving well.  She started PT last week for deconditioning and neck lymphedema.  She will be moving to an assisted living apartment at [facility] and was able to pick out the paint and carpet colors for her new home.  She attends an senior exercise class 3x/week at [facility].     -Pain: Having a lot of tongue pain, but patient reluctant to take pain medication. Tylenol works well for her.    -Nutrition/Weight: Tube feedings every 3 hours; she is very disciplined about keeping her feeding schedule; she has gained 3 lbs since her last visit in mid-03/2015.  She doesn't eat much by mouth.  -Appetite: Still struggling with appetite and po intake; She does not eat much by mouth at all; did have some Cheerios and banana cereal today that went well.  -Dental: Saw [facility] in 01/2016 and was discharged to primary care dentist; doing fluoride trays every night.    ADDITIONAL REVIEW OF SYSTEMS:  Review of Systems  HENT:       -Tongue/mouth pain that radiates up to right ear & jaw -"Tip of my tongue is numb sometimes"  -Thick saliva, but improving  Cardiovascular: Negative for leg swelling.  Gastrointestinal: Negative for vomiting and constipation.       -Chronic constipation; having regular BMs with help of Senna, which recently caused diarrhea.  LBM today.   Neurological: Positive for dizziness.       "  I always get dizzy when I take prednisone"; she is now on 10mg  prednisone/day for bullous pemphigoid as directed by her Dermatologist.      CURRENT MEDICATIONS:  Current Outpatient Prescriptions on File Prior to Visit  Medication Sig Dispense Refill  . acetaminophen (TYLENOL) 500 MG tablet Take 1 tablet (500 mg total) by mouth every 6 (six) hours as needed for mild pain. May take 1-2 tablets. Do not exceed 4000 mg per day. 120 tablet 5  . cholecalciferol (VITAMIN D) 1000 UNITS tablet Take 2,000  Units by mouth daily.    . fluconazole (DIFLUCAN) 100 MG tablet Take 1 tablet (100 mg total) by mouth daily. Take 2 tablets by mouth today; then begin 1 tablet daily 31 tablet 0  . levothyroxine (SYNTHROID, LEVOTHROID) 75 MCG tablet TAKE 1 TABLET BY MOUTH EVERY MORNING 90 tablet 1  . lidocaine (XYLOCAINE) 2 % solution Caregiver:Mix 1part 2% viscous lidocaine, 1part H20. Swish and/or swallow 91mL of this mixture, 21min before meals and at bedtime, up to QID (Patient not taking: Reported on 04/08/2016) 100 mL 5  . Nutritional Supplements (FEEDING SUPPLEMENT, OSMOLITE 1.5 CAL,) LIQD Begin 120 cc Osmolite 1.5 QID with 60 cc free water before and after each feeding. Day 3, increase to 180 cc Osmolite 1.5 as tolerated. Day 5 increase to 240 cc Osmolite 1.5 as tolerated. Day 7, increase to 240 cc Osmolite 1.5 5 times daily. Flush or drink an additional 240 cc free water daily. Please send feedings and supplies. 5 Bottle 0  . nystatin (MYCOSTATIN) 100000 UNIT/ML suspension Take 5 mLs (500,000 Units total) by mouth 4 (four) times daily. (Patient not taking: Reported on 04/08/2016) 473 mL 0  . polyethylene glycol (MIRALAX / GLYCOLAX) packet 17 g by PEG Tube route daily. Reported on 04/21/2016    . senna (SENOKOT) 8.6 MG TABS tablet Take 2 tablets (17.2 mg total) by mouth at bedtime. May take every other day if stools become loose. (Patient not taking: Reported on 04/21/2016) 60 each 5  . traMADol (ULTRAM) 50 MG tablet Take 75 mg by mouth 2 (two) times daily. Reported on 04/08/2016     No current facility-administered medications on file prior to visit.    ALLERGIES:  Allergies  Allergen Reactions  . Sulfa Antibiotics   . Ampicillin Swelling  . Chlorine   . Codeine   . Macrobid [Nitrofurantoin Monohydrate Macrocrystals]   . Statins   . Trimethoprim      PHYSICAL EXAM:  Filed Vitals:   04/29/16 1440  BP: 146/50  Pulse: 88  Temp: 97.7 F (36.5 C)  Resp: 16   Weight Date  138 lb 11.2 oz (62.914 kg)  04/29/16  135 lb 14.4 oz (61.644 kg) 04/08/16  138 lb 6.4 oz (62.778 kg) 03/24/16  139 lb 4.8 oz (63.186 kg) 02/22/16  135 lb 11.2 oz (61.553 kg) 01/25/16  133 lb 9.6 oz (60.601 kg) 12/28/15  138 lb 12.8 oz (62.959 kg) 12/24/15  138 lb 11.2 oz (62.914 kg) 12/21/15  140 lb 3.2 oz (63.594 kg) 12/14/15  142 lb 8 oz (64.638 kg) 12/08/15  143 lb 3.2 oz (64.955 kg) Pre-treatment: 12/04/15   General: Elderly female in no acute distress.  Accompanied in clinic today by her daughter.  HEENT: Head is normocephalic.  Pupils equal and reactive to light. Conjunctivae clear without exudate.  Sclerae anicteric. Oral mucosa is dry; saliva mildly ropey. Tongue is dry; no longer with thrush.  Lymph: There is a palpable round node located in the  right submandibular region.  Neck: Mild anterior neck lymphedema.   Cardiovascular: Regular rate and rhythm.  Respiratory: Clear to auscultation bilaterally. Breathing non-labored.   GI: Abdomen soft and round. Non-tender.  Normoactive bowel sounds. G-tube in place. GU: Deferred.   Neuro: No focal deficits. Slow gait with walker.   Psych: Slightly flat affect. Extremities: No edema. Skin: Warm and dry.    LABORATORY DATA:  None at this visit.  DIAGNOSTIC IMAGING:  None at this visit.    ASSESSMENT & PLAN:  Ms. Coontz is a very pleasant 82 y.o. 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   1. Cancer of the right base of tongue: Ms. Kinkade is continuing to recover from palliative radiation therapy.  She remains discouraged regarding the news she recently received with her PET scan in 03/2016. The patient understands that the PET scan showed some response to the BOT tumor, but there remains hypermetabolic activity in the tongue and right submandibular/cervical lymph nodes; there was also a new necrotic left neck hypermetabolic  lymph node in internal IJ region.  I re-reviewed the results of the PET scan today and provided the patient/daughter with a copy of the radiology results.    2. Pain:  Her tongue being sore seems to bother her the most, as well as the radiating pain up to her right ear.  Her pain is well-controlled when she takes the Tylenol.  I encouraged her to try to schedule the Tylenol doses during the day and at bedtime to provide more adequate pain relief, rather than waiting until her pain levels were higher to take the Tylenol. She has no side effects from the Tylenol and providing adequate pain management is important to her overall quality of life.  We discussed the safe parameters for total amount of Tylenol per day. She should not exceed 4000 mg/day, which she likely will not come close to doing if she takes 500 mg 4-5 times per day.  I also prescribed Magic Mouthwash for her to try before meals.  She could pre-medicate with both the Tylenol and Magic Mouthwash before attempting to eat anything by mouth.  This may provide her adequate relief to be able to enjoy some of her food.      3. Physical activity/PT: I commended her efforts to begin physical therapy and engage in physical activity at [facility] with the exercise classes.  I reinforced the importance of physical activity to maintain and gain muscle strength and stamina.  PT will also help with the mild neck lymphedema as well.   4. Adjustment disorder with depressed mood: Ms. Fuge is continuing to struggle with coping from her illness.  This is quite common in cancer survivors after they complete treatment and have time to begin to process their emotions.  We have discussed this many times at previous visits.  She tells me that she has thought about restarting the Remeron and is not willing to take the risk of having a "fainting spell" again.  I reminded her that her medical team at [facility] did not feel that her fall was related to the Remeron,  but she remains steadfast in her decision not to restart the medication, which I respect.  We discussed the option of trying SSRI medication to help with what I believe to be clinical depression at this point, but she declines any  pharmacologic management at this time.  "I just do not want to put any more chemicals in my body anymore."  Again, I expressed support and understanding for her decision.  If she does not wish to take medication, then I recommended her seek professional counseling with either our cancer center staff and/or someone in the community.  I provided a list of local counseling resources to the patient today.  I also encouraged her to reach out to Lorrin Jackson, [facility] as needed as well, as she can continue to provide emotional & spiritual support for Ms. Woody as well.   I encouraged Ms. Lafalce and her daughter to ask questions regarding her above plan of care. All questions were answered to her stated satisfaction.  A written copy of the After Visit Summary with the plan of care was provided to the patient with an extra copy to share with the [facility] staff.  They know to call me with additional questions.    Dispo:  -Of note: Code Status-DNR -Patient is welcome to follow-up with me anytime. I have not placed an additional follow-up appt at this time and will leave it open-ended based on the patient's needs/wishes. Will consider bringing her back in about 2 months for quality of life/symptom optimization, if needed.    A total of 30 minutes was spent with this patient in face-to-face care, with greater than 50% of that time in counseling and care-coordination.  Mike Craze, NP [place] [phone]

## 2016-04-29 ENCOUNTER — Ambulatory Visit (HOSPITAL_BASED_OUTPATIENT_CLINIC_OR_DEPARTMENT_OTHER): Payer: Medicare Other | Admitting: Adult Health

## 2016-04-29 VITALS — BP 146/50 | HR 88 | Temp 97.7°F | Resp 16 | Wt 138.7 lb

## 2016-04-29 DIAGNOSIS — C01 Malignant neoplasm of base of tongue: Secondary | ICD-10-CM

## 2016-04-29 DIAGNOSIS — R634 Abnormal weight loss: Secondary | ICD-10-CM

## 2016-04-29 DIAGNOSIS — K1379 Other lesions of oral mucosa: Secondary | ICD-10-CM

## 2016-04-29 MED ORDER — MAGIC MOUTHWASH W/LIDOCAINE: 5.0000 mL | Freq: Four times a day (QID) | ORAL | Status: DC | PRN

## 2016-04-29 MED FILL — CMPD MMW LID/MAALOX/DIPHEN: 5 days supply | Qty: 600 | Fill #0

## 2016-04-29 NOTE — Patient Instructions (Signed)
-  Try taking the Tylenol every morning when you first wake up to prevent the pain from getting worse.  You can take the Tylenol again before meals if you are feeling up to trying foods in the dining room.  Do not exceed 4000 mg of Tylenol per day.  -Try swishing with the Magic Mouthwash about 15-20 minutes before any by mouth meals as well and you can use this up to 4 times per day as needed.   Call me with any questions! Mike Craze, NP [place] [phone]

## 2016-05-02 ENCOUNTER — Ambulatory Visit: Payer: Self-pay | Admitting: Adult Health

## 2016-05-03 ENCOUNTER — Ambulatory Visit: Payer: Medicare Other | Attending: Radiation Oncology | Admitting: Physical Therapy

## 2016-05-03 DIAGNOSIS — R262 Difficulty in walking, not elsewhere classified: Secondary | ICD-10-CM | POA: Insufficient documentation

## 2016-05-03 DIAGNOSIS — M6281 Muscle weakness (generalized): Secondary | ICD-10-CM | POA: Insufficient documentation

## 2016-05-03 DIAGNOSIS — I890 Lymphedema, not elsewhere classified: Secondary | ICD-10-CM | POA: Diagnosis present

## 2016-05-03 NOTE — Patient Instructions (Addendum)
Weight Shift: Stepping Forward and Back    Weight on left leg, step backward and transfer weight onto right leg, lifting toes of left foot. Then step forward and transfer weight onto right leg, lifting heel of left foot. Repeat _5__ times each way. Then stand on right leg and move left leg.  Copyright  VHI. All rights reserved.    Functional Quadriceps: Sit to Stand    Sit on edge of chair, feet flat on floor. Stand upright, extending knees fully. Repeat _5-10___ times per set.  http://orth.exer.us/735   Copyright  VHI. All rights reserved.  Abduction: Scaption - Thumb Up (Dumbbell)    Lift arms out from sides, thumbs up. Can use a can of something from your pantry or a one pound weight  Repeat _10___ times per set.    Copyright  VHI. All rights reserved.  Biceps Curl    Exhaling, bend arms up. Inhaling, extend arms down. Add small weights if you want.  Try to stand tall with your tummy pulled in  Repeat _10__ times.  Copyright  VHI. All rights reserved.  Marching In-Place    Standing straight, alternate bringing knees toward trunk. Arms swing alternately. Do _10__ sets..  Copyright  VHI. All rights reserved.  Copyright  VHI. All rights reserved.

## 2016-05-03 NOTE — Therapy (Signed)
[place], [place], [address] Phone: [phone]   Fax:  [phone]  Physical Therapy Treatment  Patient Details  Name: [name] MRN: [id number] Date of Birth: 12-Jul-1924 Referring Provider: Mike Craze  Encounter Date: 05/03/2016      PT End of Session - 05/03/16 1651    Visit Number 2   Number of Visits 7   Date for PT Re-Evaluation 05/21/16   PT Start Time 1430   PT Stop Time 1515   PT Time Calculation (min) 45 min   Activity Tolerance Patient tolerated treatment well   Behavior During Therapy [facility] for tasks assessed/performed      Past Medical History  Diagnosis Date  . Nasal polyps   . Osteoporosis   . Mitral valve disorders   . Chronic rhinitis   . Unspecified urinary incontinence   . Asthma   . Hyperlipidemia   . Hypertension   . Urge incontinence   . Unspecified hypothyroidism   . Bilateral claudication of lower limb ([place]) 08/29/2013    Bilateral posterior thighs  . Aortic atherosclerosis ([place]) 08/29/2013    Identified on Aortic ultrasound exam 2008   . Bullous pemphigoid 02/2015    Dr. Wilhemina Bonito  . Arthritis   . Hx of radiation therapy 12/16/15- 01/12/16    Base of Tongue and upper neck, Right Lower neck    Past Surgical History  Procedure Laterality Date  . Nasal polyp surgery  03/2002    x2 Dr Wilburn Cornelia  . Tonsillectomy    . Total abdominal hysterectomy w/ bilateral salpingoophorectomy  1970's  . Bunionectomy Left 12/1998  . Milk cyst right breast    . Breast lumpectomy Right 1953  . Bunionectomy Right 02/2000  . Eye surgery Bilateral 02/2000    Cataract removal  . Root canal  02/2014  . Direct laryngoscopy N/A 10/30/2015    Procedure: DIRECT LARYNGOSCOPY AND BIOPSY OF TONGUE MASS;  Surgeon: Jerrell Belfast, MD;  Location: [facility];  Service: ENT;  Laterality: N/A;    There were no vitals filed for this visit.      Subjective Assessment - 05/03/16 1644    Subjective Pt states she is just "not doing good"  She know that the cancer she said is killing her, she just doesn't know when.     Pertinent History Pt. with right neck mass and tongue and ear pain.  CT scan in December showed mass at base of tonge and indicated malignant nodes.  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  There is neck adenopathy but no evidence of distant metastases.  Urinary incontinence.  h/o osteoporosis, leg claudication. Pt states that the cancer is still there and is growing.  Pt has tube feeidng  Pt also has an autoimmune condition that causes itchy blisters.    Patient Stated Goals daugher wants to see if there is anything to be done with her walking   Pt just wants to feel good. She wants to be able to walk, go out to have dinner and go to the symphony   Currently in Pain? No/denies  has pain at 4:00am that wakes her up every night    Pain Location Mouth                         [facility] Adult PT Treatment/Exercise - 05/03/16 0001    Elbow Exercises   Elbow Flexion AROM;Both;20 reps;Standing   Bar Weights/Barbell (  Elbow Flexion) 2 lbs   Knee/Hip Exercises: Standing   Hip Flexion AROM;Both;10 reps   Forward Lunges Both;5 reps   Side Lunges Both;5 reps   Hip Abduction AROM;Both;10 reps   Abduction Limitations walking with red theraband around distal thighs    Wall Squat 10 reps   Wall Squat Limitations "power up"    Other Standing Knee Exercises standing weight shifts    Knee/Hip Exercises: Seated   Long Arc Quad Both;10 reps   Long Arc Quad Weight 3 lbs.   Marching 10 reps   Marching Limitations 3 pounds    Sit to [employer] 10 reps;with UE support   Shoulder Exercises: Seated   Other Seated Exercises scaption with 1 pound    Shoulder Exercises: Standing   Extension Both;10 reps;Theraband   Theraband Level (Shoulder Extension) Level 2 (Red)   Row Strengthening;Both;5 reps;Theraband   Theraband Level (Shoulder Row) Level 2  (Red)   Ankle Exercises: Seated   Heel Raises 10 reps                PT Education - 05/03/16 1651    Education provided Yes   Education Details strengthening home exercise program    Person(s) Educated Patient   Methods Explanation;Demonstration;Handout   Comprehension Verbalized understanding;Returned demonstration                [facility] Goals - 05/03/16 1654    CC Long Term Goal  #1   Title Patient will be independent in self manual lymph drainage   Status On-going   CC Long Term Goal  #2   Title Patient will be know how to obtain and use compression garments for maintenance phase of treatment   Status On-going   CC Long Term Goal  #3   Title Patient will be independent in  home exercise program   Status Achieved   CC Long Term Goal  #4   Title pt will perform 9 sit to stand repetitions in 30 seconds indicating an improvement in general strength   Status On-going         [facility] Goals - 12/08/15 1335    Patient will be able to verbalize understanding of a home exercise program for cervical range of motion, posture, and walking.    Status Achieved   Patient will be able to verbalize understanding of proper sitting and standing posture.    Status Achieved   Patient will be able to verbalize understanding of lymphedema risk and availability of treatment for this condition.    Status Achieved           Plan - 05/03/16 1652    Clinical Impression Statement Pt received home exercise program for strengthening and stated she should be able to do it at home.  Adjusted walker so that it is a bit higher and she can stand up straighter with it. She states she no longer uses it in her home. She achieved goal for home ex program    Rehab Potential Good   Clinical Impairments Affecting Rehab Potential radiation for tonsillar cancer    PT Frequency 2x / week   PT Duration 4 weeks   PT Treatment/Interventions ADLs/Self Care  Home Management;Manual lymph drainage;Compression bandaging;Patient/family education;Functional mobility training;Therapeutic exercise;Manual techniques;Taping   PT Next Visit Plan Manual lymph drainage with teaching of self treatment with use of mirror.  Issue chip pack       Patient will benefit from skilled therapeutic intervention in order  to improve the following deficits and impairments:  Decreased activity tolerance, Postural dysfunction, Increased edema, Decreased strength, Decreased balance, Abnormal gait, Difficulty walking  Visit Diagnosis: Muscle weakness (generalized)  Difficulty in walking  Lymphedema, not elsewhere classified     Problem List Patient Active Problem List   Diagnosis Date Noted  . Malignant neoplasm of base of tongue ([place]) 12/07/2015  . Malignant neoplastic disease ([place]) 11/26/2015  . Bullous pemphigoid 07/29/2015  . Aortic atherosclerosis ([place]) 08/29/2013  . Bilateral claudication of lower limb ([place]) 08/29/2013  . Hypothyroidism   . Right hip pain 01/16/2013  . Asthma   . Hyperlipidemia   . Hypertension   . Urge incontinence   . NASAL POLYP 05/24/2010  . RHINITIS 08/04/2007  . Allergic asthma 08/04/2007     [name]. [name] PT   Norwood Levo 05/03/2016, 4:55 PM  [place], [place], [address] Phone: [phone]   Fax:  [phone]  Name: [name] MRN: [id number] Date of Birth: 31-May-1924

## 2016-05-05 ENCOUNTER — Ambulatory Visit: Payer: Medicare Other | Admitting: Physical Therapy

## 2016-05-05 DIAGNOSIS — R262 Difficulty in walking, not elsewhere classified: Secondary | ICD-10-CM

## 2016-05-05 DIAGNOSIS — M6281 Muscle weakness (generalized): Secondary | ICD-10-CM | POA: Diagnosis not present

## 2016-05-05 DIAGNOSIS — I890 Lymphedema, not elsewhere classified: Secondary | ICD-10-CM

## 2016-05-05 NOTE — Patient Instructions (Signed)
Sit in front of a mirror. Do 5 slow deep breaths, breathing in through the nose and out through the mouth, letting your belly "inflate" as you breathe in.  1) Place hands on areas just behind collar bones and do 10 stationary circles with stretch in outward directions. 2) Do stationary circles at each armpit about 10 times. 3) Place one hand on the front of the opposite shoulder and do stationary circles with stretch downward toward underarm. 4) Repeat #1 above. 5) Imagine a river runnning in a line from the earlobe straight down the neck.  Place hands just behind this and do circles with stretch coming forward slightly and down, thinking about fluid flowing down that river.  Do 10 times. 6) Place one hand just in front of the river on one side and do circles with a slight back and then downward stretch, thinking again about putting that fluid in the river.  Do 10-20 times on each side. 7) Place one hand just slightly in front of the spot you just did and do the same thing.  DO THIS VERY GENTLY. 8) Use the side of the fingers of your dominant hand to pump downward starting just under the chin and "stair stepping" downward with a stretch, working down to the chest. 9) Repeat #1.  Do not slide on the skin, but STRETCH it with your motions. Only give enough pressure to stretch the skin. Make sure to wash your hands prior to doing this. DO THIS SLOWLY, PLEASE!  And do once a day.

## 2016-05-05 NOTE — Therapy (Signed)
[place], [place], [address] Phone: [phone]   Fax:  [phone]  Physical Therapy Treatment  Patient Details  Name: [name] MRN: [id number] Date of Birth: [birth year] Referring Provider: Mike Craze  Encounter Date: 05/05/2016    Past Medical History  Diagnosis Date  . Nasal polyps   . Osteoporosis   . Mitral valve disorders   . Chronic rhinitis   . Unspecified urinary incontinence   . Asthma   . Hyperlipidemia   . Hypertension   . Urge incontinence   . Unspecified hypothyroidism   . Bilateral claudication of lower limb ([place]) 08/29/2013    Bilateral posterior thighs  . Aortic atherosclerosis ([place]) 08/29/2013    Identified on Aortic ultrasound exam 2008   . Bullous pemphigoid 02/2015    Dr. Wilhemina Bonito  . Arthritis   . Hx of radiation therapy 12/16/15- 01/12/16    Base of Tongue and upper neck, Right Lower neck    Past Surgical History  Procedure Laterality Date  . Nasal polyp surgery  03/2002    x2 Dr Wilburn Cornelia  . Tonsillectomy    . Total abdominal hysterectomy w/ bilateral salpingoophorectomy  1970's  . Bunionectomy Left 12/1998  . Milk cyst right breast    . Breast lumpectomy Right 1953  . Bunionectomy Right 02/2000  . Eye surgery Bilateral 02/2000    Cataract removal  . Root canal  02/2014  . Direct laryngoscopy N/A 10/30/2015    Procedure: DIRECT LARYNGOSCOPY AND BIOPSY OF TONGUE MASS;  Surgeon: Jerrell Belfast, MD;  Location: [facility];  Service: ENT;  Laterality: N/A;    There were no vitals filed for this visit.      Subjective Assessment - 05/05/16 1751    Subjective Pt reports she is doing the exercises with no problem and does not need review of home program. She was participative in learning manual lymph draiange, exercise for neck and use of compression chip pack.    Patient is accompained by: Family member  daughter [name] Adult PT Treatment/Exercise - 05/05/16 0001    Self-Care   Self-Care Other Self-Care Comments   Other Self-Care Comments  provided foam chip pack to wear under chin  30 minutes before and after manual lymph drainage.   Neck Exercises: Seated   Other Seated Exercise neck range of motion, 'kiss the sky" and shoulder rolls for lymphatic preparation    Manual Therapy   Manual Lymphatic Drainage (MLD) instructing as i went with occasional hand over hand instruction and referring to handout.in sitting: short neck , both axillary nodes and shoudler collectors, anterior throat and chin. Instructed pt in placement of chip pack                         [name] - 05/03/16 1654    CC Long Term Goal  #1   Title Patient will be independent in self manual lymph drainage   Status On-going   CC Long Term Goal  #2   Title Patient will be know how to obtain and use compression garments for maintenance phase of treatment   Status On-going   CC Long Term Goal  #3   Title Patient will be independent in  home exercise program   Status Achieved   CC Long  Term Goal  #4   Title pt will perform 9 sit to stand repetitions in 30 seconds indicating an improvement in general strength   Status On-going         [facility] Goals - 12/08/15 1335    Patient will be able to verbalize understanding of a home exercise program for cervical range of motion, posture, and walking.    Status Achieved   Patient will be able to verbalize understanding of proper sitting and standing posture.    Status Achieved   Patient will be able to verbalize understanding of lymphedema risk and availability of treatment for this condition.    Status Achieved           Plan - 05/05/16 1754    Clinical Impression Statement She was participative in learning manual lymph draiange, exercise for neck and use of compression chip pack.  Anticipate she will be able to follow  through on her own.  She and daughter will consider going to [facility] / do internect search o purchase a chin strap if temporary one is successful    Rehab Potential Good   Clinical Impairments Affecting Rehab Potential radiation for tonsillar cancer    PT Next Visit Plan REview and perform Manual lymph drainage with teaching of self treatment with use of mirror.  assess   chip pack   repeated sit to stand test       Patient will benefit from skilled therapeutic intervention in order to improve the following deficits and impairments:  Decreased activity tolerance, Postural dysfunction, Increased edema, Decreased strength, Decreased balance, Abnormal gait, Difficulty walking  Visit Diagnosis: Muscle weakness (generalized)  Difficulty in walking  Lymphedema, not elsewhere classified     Problem List Patient Active Problem List   Diagnosis Date Noted  . Malignant neoplasm of base of tongue ([place]) 12/07/2015  . Malignant neoplastic disease ([place]) 11/26/2015  . Bullous pemphigoid 07/29/2015  . Aortic atherosclerosis ([place]) 08/29/2013  . Bilateral claudication of lower limb ([place]) 08/29/2013  . Hypothyroidism   . Right hip pain 01/16/2013  . Asthma   . Hyperlipidemia   . Hypertension   . Urge incontinence   . NASAL POLYP 05/24/2010  . RHINITIS 08/04/2007  . Allergic asthma 08/04/2007   [name]. [name] PT   [name] 05/05/2016, 5:58 PM  [place], [place], [address] Phone: [phone]   Fax:  [phone]  Name: [name] MRN: [id number] Date of Birth: 1924-04-10

## 2016-05-10 ENCOUNTER — Ambulatory Visit: Payer: Medicare Other | Admitting: Physical Therapy

## 2016-05-10 DIAGNOSIS — M6281 Muscle weakness (generalized): Secondary | ICD-10-CM | POA: Diagnosis not present

## 2016-05-10 DIAGNOSIS — R262 Difficulty in walking, not elsewhere classified: Secondary | ICD-10-CM

## 2016-05-10 DIAGNOSIS — I890 Lymphedema, not elsewhere classified: Secondary | ICD-10-CM

## 2016-05-10 NOTE — Therapy (Signed)
[place], [place], [address] Phone: [phone]   Fax:  [phone]  Physical Therapy Treatment  Patient Details  Name: [name] MRN: [id number] Date of Birth: 1924/04/16 Referring Provider: Mike Craze  Encounter Date: 05/10/2016      Gilbert End of Session - 05/10/16 1724    Visit Number 4   Number of Visits 7   Date for Gilbert Re-Evaluation 05/21/16   Gilbert Start Time 1430   Gilbert Stop Time 1515   Gilbert Time Calculation (min) 45 min   Activity Tolerance Patient tolerated treatment well   Behavior During Therapy [facility] for tasks assessed/performed      Past Medical History  Diagnosis Date  . Nasal polyps   . Osteoporosis   . Mitral valve disorders   . Chronic rhinitis   . Unspecified urinary incontinence   . Asthma   . Hyperlipidemia   . Hypertension   . Urge incontinence   . Unspecified hypothyroidism   . Bilateral claudication of lower limb ([place]) 08/29/2013    Bilateral posterior thighs  . Aortic atherosclerosis ([place]) 08/29/2013    Identified on Aortic ultrasound exam 2008   . Bullous pemphigoid 02/2015    Dr. Wilhemina Bonito  . Arthritis   . Hx of radiation therapy 12/16/15- 01/12/16    Base of Tongue and upper neck, Right Lower neck    Past Surgical History  Procedure Laterality Date  . Nasal polyp surgery  03/2002    x2 Dr Wilburn Cornelia  . Tonsillectomy    . Total abdominal hysterectomy w/ bilateral salpingoophorectomy  1970's  . Bunionectomy Left 12/1998  . Milk cyst right breast    . Breast lumpectomy Right 1953  . Bunionectomy Right 02/2000  . Eye surgery Bilateral 02/2000    Cataract removal  . Root canal  02/2014  . Direct laryngoscopy N/A 10/30/2015    Procedure: DIRECT LARYNGOSCOPY AND BIOPSY OF TONGUE MASS;  Surgeon: Jerrell Belfast, MD;  Location: [facility];  Service: ENT;  Laterality: N/A;    There were no vitals filed for this visit.      Subjective Assessment - 05/10/16 1439    Subjective Gilbert states she has a bad night last night due to pain,in right side and into ear.  She had to take a tramadol and that knocked her out. She has been wearing the chip pack also. She thinks that her lymphedema has been going down with the massage.    Pertinent History Gilbert. with right neck mass and tongue and ear pain.  CT scan in December showed mass at base of tonge and indicated malignant nodes.  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  There is neck adenopathy but no evidence of distant metastases.  Urinary incontinence.  h/o osteoporosis, leg claudication. Gilbert states that the cancer is still there and is growing.  Gilbert has tube feeidng  Gilbert also has an autoimmune condition that causes itchy blisters.    Patient Stated Goals daugher wants to see if there is anything to be done with her walking   Gilbert just wants to feel good. She wants to be able to walk, go out to have dinner and go to the symphony   Currently in Pain? Yes   Pain Score 4    Pain Location Mouth   Pain Orientation Right   Pain Radiating Towards up into ear    Pain Relieving Factors she takes tylenol on a schedule  [facility] Adult Gilbert Treatment/Exercise - 05/10/16 0001    Neck Exercises: Seated   Other Seated Exercise neck range of motion, 'kiss the sky" and shoulder rolls for lymphatic preparation    Manual Therapy   Manual Lymphatic Drainage (MLD) in sitting for short neck, axillary and pectoral nodes and posterior cervical area, then to supine for anterior throat, cheeks, periauricular area and lateral neck with direction toward chest and axiallry area                         [facility] Goals - 05/03/16 1654    CC Long Term Goal  #1   Title Patient will be independent in self manual lymph drainage   Status On-going   CC Long Term Goal  #2   Title Patient will be know how to obtain and use compression garments for maintenance phase of  treatment   Status On-going   CC Long Term Goal  #3   Title Patient will be independent in  home exercise program   Status Achieved   CC Long Term Goal  #4   Title Gilbert will perform 9 sit to stand repetitions in 30 seconds indicating an improvement in general strength   Status On-going         [facility] Goals - 12/08/15 1335    Patient will be able to verbalize understanding of a home exercise program for cervical range of motion, posture, and walking.    Status Achieved   Patient will be able to verbalize understanding of proper sitting and standing posture.    Status Achieved   Patient will be able to verbalize understanding of lymphedema risk and availability of treatment for this condition.    Status Achieved           Plan - 05/10/16 1727    Clinical Impression Statement Gilbert very tired today and continued to have an earache after treatment but got very relaxed.  She will put on her chippack when she gets home    Clinical Impairments Affecting Rehab Potential radiation for tonsillar cancer    Gilbert Next Visit Plan REview and perform Manual lymph drainage with teaching of self treatment with use of mirror.  assess   chip pack   repeated sit to stand test    Consulted and Agree with Plan of Care Patient      Patient will benefit from skilled therapeutic intervention in order to improve the following deficits and impairments:  Decreased activity tolerance, Postural dysfunction, Increased edema, Decreased strength, Decreased balance, Abnormal gait, Difficulty walking  Visit Diagnosis: Muscle weakness (generalized)  Difficulty in walking  Lymphedema, not elsewhere classified     Problem List Patient Active Problem List   Diagnosis Date Noted  . Malignant neoplasm of base of tongue ([place]) 12/07/2015  . Malignant neoplastic disease ([place]) 11/26/2015  . Bullous pemphigoid 07/29/2015  . Aortic atherosclerosis ([place]) 08/29/2013  . Bilateral  claudication of lower limb ([place]) 08/29/2013  . Hypothyroidism   . Right hip pain 01/16/2013  . Asthma   . Hyperlipidemia   . Hypertension   . Urge incontinence   . NASAL POLYP 05/24/2010  . RHINITIS 08/04/2007  . Allergic asthma 08/04/2007    [name]. [name]    Norwood Levo 05/10/2016, 5:29 PM  [place], [place], [address] Phone: [phone]   Fax:  [phone]  Name: Margo Brookhouse MRN: [id number]  Date of Birth: 1924/05/07

## 2016-05-12 ENCOUNTER — Encounter: Payer: Self-pay | Admitting: Physical Therapy

## 2016-05-13 ENCOUNTER — Ambulatory Visit: Payer: Medicare Other | Admitting: Physical Therapy

## 2016-05-13 DIAGNOSIS — M6281 Muscle weakness (generalized): Secondary | ICD-10-CM

## 2016-05-13 DIAGNOSIS — I890 Lymphedema, not elsewhere classified: Secondary | ICD-10-CM

## 2016-05-13 DIAGNOSIS — R262 Difficulty in walking, not elsewhere classified: Secondary | ICD-10-CM

## 2016-05-13 NOTE — Therapy (Signed)
[place], [place], [address] Phone: [phone]   Fax:  [phone]  Physical Therapy Treatment  Patient Details  Name: [name] MRN: [id number] Date of Birth: 06/08/1924 Referring Provider: Mike Craze  Encounter Date: 05/13/2016      PT End of Session - 05/13/16 1225    Visit Number 5   Number of Visits 7   Date for PT Re-Evaluation 05/21/16   PT Start Time 1100   PT Stop Time 1145   PT Time Calculation (min) 45 min   Activity Tolerance Patient tolerated treatment well   Behavior During Therapy [facility] for tasks assessed/performed      Past Medical History  Diagnosis Date  . Nasal polyps   . Osteoporosis   . Mitral valve disorders   . Chronic rhinitis   . Unspecified urinary incontinence   . Asthma   . Hyperlipidemia   . Hypertension   . Urge incontinence   . Unspecified hypothyroidism   . Bilateral claudication of lower limb ([place]) 08/29/2013    Bilateral posterior thighs  . Aortic atherosclerosis ([place]) 08/29/2013    Identified on Aortic ultrasound exam 2008   . Bullous pemphigoid 02/2015    Dr. Wilhemina Bonito  . Arthritis   . Hx of radiation therapy 12/16/15- 01/12/16    Base of Tongue and upper neck, Right Lower neck    Past Surgical History  Procedure Laterality Date  . Nasal polyp surgery  03/2002    x2 Dr Wilburn Cornelia  . Tonsillectomy    . Total abdominal hysterectomy w/ bilateral salpingoophorectomy  1970's  . Bunionectomy Left 12/1998  . Milk cyst right breast    . Breast lumpectomy Right 1953  . Bunionectomy Right 02/2000  . Eye surgery Bilateral 02/2000    Cataract removal  . Root canal  02/2014  . Direct laryngoscopy N/A 10/30/2015    Procedure: DIRECT LARYNGOSCOPY AND BIOPSY OF TONGUE MASS;  Surgeon: Jerrell Belfast, MD;  Location: [place];  Service: ENT;  Laterality: N/A;    There were no vitals filed for this visit.      Subjective Assessment - 05/13/16 1109     Subjective I don't feel well today     Patient is accompained by: Family member  daughter and grandson   Pertinent History Pt. with right neck mass and tongue and ear pain.  CT scan in December showed mass at base of tonge and indicated malignant nodes.  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  There is neck adenopathy but no evidence of distant metastases.  Urinary incontinence.  h/o osteoporosis, leg claudication. Pt states that the cancer is still there and is growing.  Pt has tube feeidng  Pt also has an autoimmune condition that causes itchy blisters.    Patient Stated Goals daugher wants to see if there is anything to be done with her walking   Pt just wants to feel good. She wants to be able to walk, go out to have dinner and go to the symphony   Currently in Pain? Yes   Pain Score 4    Pain Location Face   Pain Orientation Right   Pain Descriptors / Indicators Aching;Constant   Pain Radiating Towards up into ear, sometimes wakes me up    Pain Onset More than a month ago               LYMPHEDEMA/ONCOLOGY QUESTIONNAIRE - 05/13/16 1140    Head and  Neck   4 cm superior to sternal notch around neck 36.8 cm   6 cm superior to sternal notch around neck 36.2 cm   8 cm superior to sternal notch around neck 0.36 cm                  [facility] Adult PT Treatment/Exercise - 05/13/16 0001    Self-Care   Other Self-Care Comments  daughter will work on getting a chin strap for patient to use for chip pack    Neck Exercises: Seated   Other Seated Exercise neck range of motion, 'kiss the sky" and shoulder rolls for lymphatic preparation    Manual Therapy   Manual Lymphatic Drainage (MLD) in sitting for short neck, axillary and pectoral nodes and posterior cervical area,   anterior throat, cheeks, periauricular area and lateral neck with direction toward chest and axiallry area Hand over hand technique for patient instruction with frequent cues to move more  slowly and lighter.  At end of treatment, she had better technique. Pt encouraged to use a chip pack.                         [facility] Goals - 05/13/16 1228    CC Long Term Goal  #1   Title Patient will be independent in self manual lymph drainage   Status On-going   CC Long Term Goal  #2   Title Patient will be know how to obtain and use compression garments for maintenance phase of treatment   Status On-going   CC Long Term Goal  #3   Title Patient will be independent in  home exercise program   Status Achieved   CC Long Term Goal  #4   Title pt will perform 9 sit to stand repetitions in 30 seconds indicating an improvement in general strength   Status On-going         [facility] Clinic Goals - 12/08/15 1335    Patient will be able to verbalize understanding of a home exercise program for cervical range of motion, posture, and walking.    Status Achieved   Patient will be able to verbalize understanding of proper sitting and standing posture.    Status Achieved   Patient will be able to verbalize understanding of lymphedema risk and availability of treatment for this condition.    Status Achieved           Plan - 05/13/16 1225    Clinical Impression Statement Pt comes in today with daughter and grandson.  She states she just does not feel good and continues with multiple complaints.  She is bothered by pain in her face, jaw and back of head today.  Suggested she talk with Mike Craze for other pain management options as she does not want to  take more pain medicine. Pt needed hand over hand instruction in manual lymph drainage technique today and needs more instruction.    Rehab Potential Good   Clinical Impairments Affecting Rehab Potential radiation for tonsillar cancer    PT Frequency --  decrease to once/ week    PT Duration 2 weeks   PT Treatment/Interventions ADLs/Self Care Home Management;Manual lymph  drainage;Compression bandaging;Patient/family education;Functional mobility training;Therapeutic exercise;Manual techniques;Taping   PT Next Visit Plan REview and perform Manual lymph drainage with teaching of self treatment with use of mirror.  assess   chip pack   repeated sit to stand test  Patient will benefit from skilled therapeutic intervention in order to improve the following deficits and impairments:  Decreased activity tolerance, Postural dysfunction, Increased edema, Decreased strength, Decreased balance, Abnormal gait, Difficulty walking  Visit Diagnosis: Muscle weakness (generalized)  Difficulty in walking  Lymphedema, not elsewhere classified     Problem List Patient Active Problem List   Diagnosis Date Noted  . Malignant neoplasm of base of tongue ([place]) 12/07/2015  . Malignant neoplastic disease ([place]) 11/26/2015  . Bullous pemphigoid 07/29/2015  . Aortic atherosclerosis ([place]) 08/29/2013  . Bilateral claudication of lower limb ([place]) 08/29/2013  . Hypothyroidism   . Right hip pain 01/16/2013  . Asthma   . Hyperlipidemia   . Hypertension   . Urge incontinence   . NASAL POLYP 05/24/2010  . RHINITIS 08/04/2007  . Allergic asthma 08/04/2007     [name]. [name], PT    05/13/2016, 12:30 PM  [place], [place], [address] Phone: [phone]   Fax:  [phone]  Name: Noreta Mcnicholas MRN: [id number] Date of Birth: 1924-05-22

## 2016-05-18 ENCOUNTER — Ambulatory Visit: Payer: Medicare Other

## 2016-05-18 DIAGNOSIS — R262 Difficulty in walking, not elsewhere classified: Secondary | ICD-10-CM

## 2016-05-18 DIAGNOSIS — M6281 Muscle weakness (generalized): Secondary | ICD-10-CM | POA: Diagnosis not present

## 2016-05-18 DIAGNOSIS — I890 Lymphedema, not elsewhere classified: Secondary | ICD-10-CM

## 2016-05-18 NOTE — Therapy (Signed)
[place], [place], [address] Phone: [phone]   Fax:  [phone]  Physical Therapy Treatment  Patient Details  Name: [name] MRN: [id number] Date of Birth: 04-Dec-1923 Referring Provider: Mike Craze  Encounter Date: 05/18/2016      PT End of Session - 05/18/16 1522    Visit Number 6   Number of Visits 7   Date for PT Re-Evaluation 05/21/16   PT Start Time 6160   PT Stop Time 1525   PT Time Calculation (min) 56 min   Activity Tolerance Patient tolerated treatment well   Behavior During Therapy [facility] for tasks assessed/performed      Past Medical History:  Diagnosis Date  . Aortic atherosclerosis ([place]) 08/29/2013   Identified on Aortic ultrasound exam 2008   . Arthritis   . Asthma   . Bilateral claudication of lower limb ([place]) 08/29/2013   Bilateral posterior thighs  . Bullous pemphigoid 02/2015   Dr. Wilhemina Bonito  . Chronic rhinitis   . Hx of radiation therapy 12/16/15- 01/12/16   Base of Tongue and upper neck, Right Lower neck  . Hyperlipidemia   . Hypertension   . Mitral valve disorders   . Nasal polyps   . Osteoporosis   . Unspecified hypothyroidism   . Unspecified urinary incontinence   . Urge incontinence     Past Surgical History:  Procedure Laterality Date  . BREAST LUMPECTOMY Right 1953  . BUNIONECTOMY Left 12/1998  . BUNIONECTOMY Right 02/2000  . DIRECT LARYNGOSCOPY N/A 10/30/2015   Procedure: DIRECT LARYNGOSCOPY AND BIOPSY OF TONGUE MASS;  Surgeon: Jerrell Belfast, MD;  Location: [place];  Service: ENT;  Laterality: N/A;  . EYE SURGERY Bilateral 02/2000   Cataract removal  . milk cyst right breast    . NASAL POLYP SURGERY  03/2002   x2 Dr Wilburn Cornelia  . ROOT CANAL  02/2014  . TONSILLECTOMY    . TOTAL ABDOMINAL HYSTERECTOMY W/ BILATERAL SALPINGOOPHORECTOMY  1970's    There were no vitals filed for this visit.      Subjective Assessment - 05/18/16 1501    Subjective  My new compression garment came but I'm not sure how to wear it.    Pertinent History Pt. with right neck mass and tongue and ear pain.  CT scan in December showed mass at base of tonge and indicated malignant nodes.  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  There is neck adenopathy but no evidence of distant metastases.  Urinary incontinence.  h/o osteoporosis, leg claudication. Pt states that the cancer is still there and is growing.  Pt has tube feeidng  Pt also has an autoimmune condition that causes itchy blisters.    Patient Stated Goals daugher wants to see if there is anything to be done with her walking   Pt just wants to feel good. She wants to be able to walk, go out to have dinner and go to the symphony   Currently in Pain? No/denies                         [facility] Adult PT Treatment/Exercise - 05/18/16 0001      Self-Care   Other Self-Care Comments  Pt brought her new chin strap compression garment which, once therapist donned for pt (and instructed pt and daughter in) determined it to be a really good fit for pt. Also issued a rectangle piece of lined peach  Medi foam that daughter is going to fixate inside horizontally at anterior throat section of garment. Pt was able to don and doff garment in front of mirror and reported feeling confident in being able to do this independently.     Manual Therapy   Manual therapy comments --   Manual Lymphatic Drainage (MLD) Sitting in front of mirror spent 10 minutes reviewing proper technique with pt of correct skin stretch without sliding, which she was having trouble with. Also verbally and with demonstration reviewed correct sequence by hand placement to each location of MLD.                PT Education - 05/18/16 1651    Education provided Yes   Education Details Donning/doffing chin strap in front of mirror, see flowsheet   Person(s) Educated Patient   Methods  Explanation;Demonstration;Tactile cues   Comprehension Verbalized understanding;Returned demonstration                [facility] Goals - 05/18/16 1654      CC Long Term Goal  #1   Title Patient will be independent in self manual lymph drainage   Status Partially Met     CC Long Term Goal  #2   Title Patient will be know how to obtain and use compression garments for maintenance phase of treatment   Status Partially Met     CC Long Term Goal  #3   Title Patient will be independent in  home exercise program   Status Achieved     CC Long Term Goal  #4   Title pt will perform 9 sit to stand repetitions in 30 seconds indicating an improvement in general strength   Status On-going         [facility] Clinic Goals - 12/08/15 1335      Patient will be able to verbalize understanding of a home exercise program for cervical range of motion, posture, and walking.    Status Achieved     Patient will be able to verbalize understanding of proper sitting and standing posture.    Status Achieved     Patient will be able to verbalize understanding of lymphedema risk and availability of treatment for this condition.    Status Achieved           Plan - 05/18/16 1526    Clinical Impression Statement Did not have time to measure pts circumference today as she had brought her new compression garment and needed to learn how to don it. Pt did very well with learning this and was able to don/doff the garment independently by end of session. Mother and daughter had alot of questions regarding proper technique of self MLD which pt did require more tactile cuing which she was able to duplicate after a few more trials. Pt would like to come back for one more visit in about 2 weeks to give her time to work on the Maintenance Phase of her treatment.    Rehab Potential Good   Clinical Impairments Affecting Rehab Potential radiation for tonsillar cancer    PT Frequency  1x / week   PT Duration 2 weeks   PT Treatment/Interventions ADLs/Self Care Home Management;Manual lymph drainage;Compression bandaging;Patient/family education;Functional mobility training;Therapeutic exercise;Manual techniques;Taping   PT Next Visit Plan Plan to D/C next visit. Repeat sit to stand test for goal assess. Pt to come back for one more visit to make sure she is independent with her Maintenance Phase of  treatment. Remeasure circumference next visit as well.    Consulted and Agree with Plan of Care Patient;Family member/caregiver   Family Member Consulted Daughter      Patient will benefit from skilled therapeutic intervention in order to improve the following deficits and impairments:  Decreased activity tolerance, Postural dysfunction, Increased edema, Decreased strength, Decreased balance, Abnormal gait, Difficulty walking  Visit Diagnosis: Muscle weakness (generalized)  Lymphedema, not elsewhere classified  Difficulty in walking     Problem List Patient Active Problem List   Diagnosis Date Noted  . Malignant neoplasm of base of tongue ([place]) 12/07/2015  . Malignant neoplastic disease ([place]) 11/26/2015  . Bullous pemphigoid 07/29/2015  . Aortic atherosclerosis ([place]) 08/29/2013  . Bilateral claudication of lower limb ([place]) 08/29/2013  . Hypothyroidism   . Right hip pain 01/16/2013  . Asthma   . Hyperlipidemia   . Hypertension   . Urge incontinence   . NASAL POLYP 05/24/2010  . RHINITIS 08/04/2007  . Allergic asthma 08/04/2007    Otelia Limes, PTA 05/18/2016, 5:10 PM  [place], [place], [address] Phone: [phone]   Fax:  [phone]  Name: Akshitha Culmer MRN: [id number] Date of Birth: 07/21/1924

## 2016-05-25 ENCOUNTER — Ambulatory Visit: Payer: Medicare Other | Admitting: Physical Therapy

## 2016-05-31 ENCOUNTER — Telehealth: Payer: Self-pay | Admitting: Adult Health

## 2016-05-31 NOTE — Telephone Encounter (Signed)
I received a call from [name], Ms. Roca's daughter, with concerns regarding her mom.  [name] tells me that [name] feels like the "cancer has overgrown my mouth" and has grown rapidly. [name] states that she has been looking in her mom's mouth and at her neck and has not noticed a significant change in the size of the tumors.  [name] is having pain off and on, but when asked if she has pain will decline any intervention.  [name] states that [name] "does not want to admit her problems and just swats away any solutions."   [name] tells me that the Palliative NP visited [name] at [facility]; she recommended Cymbalta for the patient, but [name] is not willing to try any medication right now.  She is planning her move to the [facility]isted Living portion of [facility], which will happen in September.  [name] states that her mom has seemed "down in the dumps and very negative" lately.  It seems that pain and lack of energy are her mom's biggest complaints.  She has been attending PT as an outpatient and will finish up therapy soon.  PT talked to the patient about considering a pain patch, but [name] isn't sure about that either.    [name] would like to bring her mom in to further discuss interventions because "she is really making all of [place] very sad and frustrated that she is unwilling to accept any help/soutions."  [name] has said that she "did the radiation for nothing" because she "got it in her head that the radiation was going to cure her, even though she knew it was not curative treatment."  I acknowledged her concerns for her mom and offered support via phone.  I expressed to [name] that I am happy to see them whenever she needs to be seen; it sounds like [name] is trying to exert any control over her situation that she can, as I imagine it all feels very overwhelming and out of control for her right now.  [name] acknowledged that this could be true and really wants me to try  to talk to the patient about considering solutions.   [name] and I have scheduled an appt for me to see [name] on Monday, 06/03/16.  She knows to call me if there are other issues/concerns before their visit here.    Mike Craze, NP [place] [phone]

## 2016-06-01 ENCOUNTER — Encounter: Payer: Self-pay | Admitting: Physical Therapy

## 2016-06-02 ENCOUNTER — Ambulatory Visit: Payer: Medicare Other | Attending: Radiation Oncology | Admitting: Physical Therapy

## 2016-06-02 DIAGNOSIS — R262 Difficulty in walking, not elsewhere classified: Secondary | ICD-10-CM | POA: Insufficient documentation

## 2016-06-02 DIAGNOSIS — I890 Lymphedema, not elsewhere classified: Secondary | ICD-10-CM | POA: Diagnosis present

## 2016-06-02 DIAGNOSIS — M6281 Muscle weakness (generalized): Secondary | ICD-10-CM

## 2016-06-02 NOTE — Therapy (Signed)
[place], [place], [address] Phone: [phone]   Fax:  [phone]  Physical Therapy Treatment  Patient Details  Name: [name] MRN: [id number] Date of Birth: 1923/12/27 Referring Provider: Mike Craze  Encounter Date: 06/02/2016      PT End of Session - 06/02/16 1803    Visit Number 7   Number of Visits 7   Date for PT Re-Evaluation 05/21/16   PT Start Time 1430   PT Stop Time 1515   PT Time Calculation (min) 45 min   Activity Tolerance Patient tolerated treatment well   Behavior During Therapy [facility] for tasks assessed/performed      Past Medical History:  Diagnosis Date  . Aortic atherosclerosis ([place]) 08/29/2013   Identified on Aortic ultrasound exam 2008   . Arthritis   . Asthma   . Bilateral claudication of lower limb ([place]) 08/29/2013   Bilateral posterior thighs  . Bullous pemphigoid 02/2015   Dr. Wilhemina Bonito  . Chronic rhinitis   . Hx of radiation therapy 12/16/15- 01/12/16   Base of Tongue and upper neck, Right Lower neck  . Hyperlipidemia   . Hypertension   . Mitral valve disorders   . Nasal polyps   . Osteoporosis   . Unspecified hypothyroidism   . Unspecified urinary incontinence   . Urge incontinence     Past Surgical History:  Procedure Laterality Date  . BREAST LUMPECTOMY Right 1953  . BUNIONECTOMY Left 12/1998  . BUNIONECTOMY Right 02/2000  . DIRECT LARYNGOSCOPY N/A 10/30/2015   Procedure: DIRECT LARYNGOSCOPY AND BIOPSY OF TONGUE MASS;  Surgeon: Jerrell Belfast, MD;  Location: [facility];  Service: ENT;  Laterality: N/A;  . EYE SURGERY Bilateral 02/2000   Cataract removal  . milk cyst right breast    . NASAL POLYP SURGERY  03/2002   x2 Dr Wilburn Cornelia  . ROOT CANAL  02/2014  . TONSILLECTOMY    . TOTAL ABDOMINAL HYSTERECTOMY W/ BILATERAL SALPINGOOPHORECTOMY  1970's    There were no vitals filed for this visit.      Subjective Assessment - 06/02/16 1440    Subjective  Pt states she is very good about doing her manual lymph drainage every day and it helps. She is still having problems with pain and numbness    Patient is accompained by: Family member   Pertinent History Pt. with right neck mass and tongue and ear pain.  CT scan in December showed mass at base of tonge and indicated malignant nodes.  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  There is neck adenopathy but no evidence of distant metastases.  Urinary incontinence.  h/o osteoporosis, leg claudication. Pt states that the cancer is still there and is growing.  Pt has tube feeidng  Pt also has an autoimmune condition that causes itchy blisters.    Patient Stated Goals daugher wants to see if there is anything to be done with her walking   Pt just wants to feel good. She wants to be able to walk, go out to have dinner and go to the symphony   Currently in Pain? Yes   Pain Score --  did not rate    Pain Location Face   Pain Orientation Right   Pain Descriptors / Indicators Aching   Pain Onset More than a month ago            [facility] PT Assessment - 06/02/16 0001      Sit to  Stand   Comments 9 times in 30 seconds            LYMPHEDEMA/ONCOLOGY QUESTIONNAIRE - 06/02/16 1511      Head and Neck   4 cm superior to sternal notch around neck 36 cm   6 cm superior to sternal notch around neck 35.5 cm   8 cm superior to sternal notch around neck 35.8 cm                  [facility] Adult PT Treatment/Exercise - 06/02/16 0001      Self-Care   Other Self-Care Comments  Upgraded compression garment with to add a piece of gray foam under peach foam for more compression under chin at neck.  This modification appears to apply compression to the firm area where pt needs it the most.  Pt's daughte will modify as needed and she was given 2 pieces of 1/4 foam to place at chin if 1/2 is too much for patient.  She understands how to do this.  Reviewd manua lymph drainage techniqu  with hand over hand instruction for patient and she ackwnowledged it.                         [facility] Goals - 06/02/16 1806      CC Long Term Goal  #1   Title Patient will be independent in self manual lymph drainage   Status Achieved     CC Long Term Goal  #2   Title Patient will be know how to obtain and use compression garments for maintenance phase of treatment     CC Long Term Goal  #3   Status Achieved     CC Long Term Goal  #4   Title pt will perform 9 sit to stand repetitions in 30 seconds indicating an improvement in general strength   Status Achieved         [facility] Goals - 12/08/15 1335      Patient will be able to verbalize understanding of a home exercise program for cervical range of motion, posture, and walking.    Status Achieved     Patient will be able to verbalize understanding of proper sitting and standing posture.    Status Achieved     Patient will be able to verbalize understanding of lymphedema risk and availability of treatment for this condition.    Status Achieved           Plan - 06/02/16 1803    Clinical Impression Statement Pt has had reduction in neck  circumference measurements and reports she is doing manual lymph draiange and exercise at home. He compression garment was modified with more foam to apply compression where she needs it.  Although pt outwardly appears to be doing well, she says she is not doing well and says she thinks the cancer is growing.  She has an appointment with Elzie Rings soon to see if there is something that can be done for her unrelenting pain.    Rehab Potential Good   Clinical Impairments Affecting Rehab Potential radiation for tonsillar cancer    PT Next Visit Plan Discharge    Consulted and Agree with Plan of Care Patient;Family member/caregiver   Family Member Consulted Daughter      Patient will benefit from skilled therapeutic intervention in order  to improve the following deficits and impairments:  Decreased activity tolerance, Postural dysfunction, Increased edema, Decreased  strength, Decreased balance, Abnormal gait, Difficulty walking  Visit Diagnosis: Muscle weakness (generalized)  Lymphedema, not elsewhere classified  Difficulty in walking       G-Codes - 06/13/16 1807    Functional Assessment Tool Used clinical judgement    Functional Limitation Changing and maintaining body position   Changing and Maintaining Body Position Goal Status (D9833) At least 1 percent but less than 20 percent impaired, limited or restricted   Changing and Maintaining Body Position Discharge Status (A2505) At least 1 percent but less than 20 percent impaired, limited or restricted      Problem List Patient Active Problem List   Diagnosis Date Noted  . Malignant neoplasm of base of tongue ([place]) 12/07/2015  . Malignant neoplastic disease ([place]) 11/26/2015  . Bullous pemphigoid 07/29/2015  . Aortic atherosclerosis ([place]) 08/29/2013  . Bilateral claudication of lower limb ([place]) 08/29/2013  . Hypothyroidism   . Right hip pain 01/16/2013  . Asthma   . Hyperlipidemia   . Hypertension   . Urge incontinence   . NASAL POLYP 05/24/2010  . RHINITIS 08/04/2007  . Allergic asthma 08/04/2007      PHYSICAL THERAPY DISCHARGE SUMMARY  Visits from Start of Care: 7  Current functional level related to goals / functional outcomes: Improved functional mobiity and gait and independent in managing lymphedema   Remaining deficits: Anterior neck lymphedema   Education / Equipment: Self manual lymph drainage, use of compression, home exercise  Plan: Patient agrees to discharge.  Patient goals were met. Patient is being discharged due to meeting the stated rehab goals.  ?????    [name]. [name] PT  Norwood Levo 06/13/2016, 6:09 PM  [place], [place],  [address] Phone: [phone]   Fax:  [phone]  Name: [name] MRN: [id number] Date of Birth: 1924-01-13

## 2016-06-06 ENCOUNTER — Ambulatory Visit (HOSPITAL_BASED_OUTPATIENT_CLINIC_OR_DEPARTMENT_OTHER): Payer: Medicare Other | Admitting: Adult Health

## 2016-06-06 VITALS — BP 150/54 | HR 87 | Temp 97.9°F | Resp 17 | Wt 140.6 lb

## 2016-06-06 DIAGNOSIS — C01 Malignant neoplasm of base of tongue: Secondary | ICD-10-CM | POA: Diagnosis present

## 2016-06-08 ENCOUNTER — Encounter: Payer: Self-pay | Admitting: Adult Health

## 2016-06-08 NOTE — Progress Notes (Signed)
REASON FOR VISIT:  Routine follow-up for head & neck cancer   BRIEF ONCOLOGIC HISTORY:    Malignant neoplasm of base of tongue ([place])   10/12/2015 Imaging    CT neck: Base of tongue mass measuring approximately 14 mm. There may be additional tumor extending to the left of this mass.  Malignant adenopathy throughout right neck. Image quality degraded by moderate motion.      10/30/2015 Initial Biopsy    Right base of tongue biopsy.  Path revealed mucoepidermoid carcinoma.       10/30/2015 Initial Diagnosis    Malignant neoplasm of base of tongue ([place])     11/11/2015 Procedure    Flexible fiberoptic laryngoscopy: Large submucosal mass noted at the base of tongue and vallecula on the right, with extension back to the vallecula with a more exophytic component directly in midline, which stopped short of the sulcus of the vallecula wi     11/13/2015 PET scan    Irregular hypermetabolic mass in right oropharynx, measuring 3.9 x 3.3 cm, (considerably larger than reported on 10/12/15 CT neck). Hypermetabolic right level 2 and right level 3 LN mets. No distant hypermetabolic metastatic disease.     12/16/2015 - 01/12/2016 Radiation Therapy    Aggressive palliative radiaiton Isidore Moos). Base of tongue and upper neck / 46 Gy in 20 fractions.  Right lower neck / 40 Gy in 20 fractions     04/06/2016 PET scan    Hypermetabolic (R) BOT mass with persistent hypermetabolic adenopathy in (R) neck & (R) submandibular region. New necrotic LN in low (L) IJ station c/w disease progression. Nodular appearance in apical segment RUL which could be infection or mets.        INTERVAL HISTORY:  Ms. Schmied reports with her daughter, [name], for follow-up based on a recent phone call regarding the patient's concerns from [name].  Lorrin Jackson, [facility] is also present for today's visit to provide additional spiritual support for the patient and her family.   Ms. Gora reports "not feeling very happy with my life" since  her last visit at the cancer center.  She has had some continued tongue pain, but does not like to take pain medication.  She tells me that the pain was so bad yesterday that she had to take 1 Tramadol tab at 2 different times.  Her pain radiates up to her (R) ear as well.  She also takes Tylenol intermittently, which is effective for her most of the time.  She does not like to take the Tramadol because it constipates her.  She states that she feels "boxed in" with her tube feeding schedule.  She wants to be able to get out and do more, but feels like she can't because she has to be able to do her tube feedings.  She is anticipating moving to an apartment in the assisted living portion of [facility] sometime in September.     -Pain: Having some tongue pain, but patient reluctant to take Tramadol d/t concerns of constipation. Tylenol works well for her unless severe pain.  -Nutrition/Weight: Tube feedings continue; has gained ~2 lbs in the past month. She doesn't eat much by mouth.  -Appetite: Still struggling with appetite and po intake; She does not eat much by mouth at all; "I'll have a few bites and that's all I want because my tongue hurts."  -Dental: Saw Kulinski in 01/2016 and was discharged to primary care dentist; doing fluoride trays every night.    ADDITIONAL REVIEW  OF SYSTEMS:  Review of Systems  HENT:       -Tongue/mouth pain that radiates up to right ear & jaw -"Tip of my tongue is numb sometimes"    Eyes: Negative.   Respiratory: Negative.   Cardiovascular: Negative for leg swelling.  Gastrointestinal: Negative for abdominal pain, constipation and vomiting.       -Chronic constipation; having regular BMs with help of Senna, which recently caused diarrhea.  LBM today.   Neurological: Positive for dizziness.       "I always get dizzy when I take prednisone"; she is on prednisone and methotrexate for her bullous pemphigoid.   Endo/Heme/Allergies: Negative.   Psychiatric/Behavioral:  Positive for depression. The patient is nervous/anxious.      CURRENT MEDICATIONS:  Current Outpatient Prescriptions on File Prior to Visit  Medication Sig Dispense Refill  . acetaminophen (TYLENOL) 500 MG tablet Take 1 tablet (500 mg total) by mouth every 6 (six) hours as needed for mild pain. May take 1-2 tablets. Do not exceed 4000 mg per day. 120 tablet 5  . cholecalciferol (VITAMIN D) 1000 UNITS tablet Take 2,000 Units by mouth daily.    Marland Kitchen levothyroxine (SYNTHROID, LEVOTHROID) 75 MCG tablet TAKE 1 TABLET BY MOUTH EVERY MORNING 90 tablet 1  . Nutritional Supplements (FEEDING SUPPLEMENT, OSMOLITE 1.5 CAL,) LIQD Begin 120 cc Osmolite 1.5 QID with 60 cc free water before and after each feeding. Day 3, increase to 180 cc Osmolite 1.5 as tolerated. Day 5 increase to 240 cc Osmolite 1.5 as tolerated. Day 7, increase to 240 cc Osmolite 1.5 5 times daily. Flush or drink an additional 240 cc free water daily. Please send feedings and supplies. 5 Bottle 0  . predniSONE (DELTASONE) 10 MG tablet Take 20 mg by mouth daily with breakfast.     . traMADol (ULTRAM) 50 MG tablet Take 75 mg by mouth 2 (two) times daily. Reported on 04/08/2016    . polyethylene glycol (MIRALAX / GLYCOLAX) packet 17 g by PEG Tube route daily. Reported on 04/21/2016     No current facility-administered medications on file prior to visit.     ALLERGIES:  Allergies  Allergen Reactions  . Sulfa Antibiotics   . Ampicillin Swelling  . Chlorine   . Codeine   . Macrobid [Nitrofurantoin Monohydrate Macrocrystals]   . Rosuvastatin Calcium Other (See Comments)  . Statins   . Trimethoprim      PHYSICAL EXAM:  Vitals:   06/06/16 1317  BP: (!) 150/54  Pulse: 87  Resp: 17  Temp: 97.9 F (36.6 C)   Filed Weights   06/06/16 1317  Weight: 140 lb 9.6 oz (63.8 kg)   Weight Date  140 lb 9.6 oz (63.8 kg) 06/06/16  138 lb 11.2 oz (62.914 kg) 04/29/16  135 lb 14.4 oz (61.644 kg) 04/08/16  138 lb 6.4 oz (62.778 kg) 03/24/16  139 lb  4.8 oz (63.186 kg) 02/22/16  135 lb 11.2 oz (61.553 kg) 01/25/16  133 lb 9.6 oz (60.601 kg) 12/28/15  138 lb 12.8 oz (62.959 kg) 12/24/15  138 lb 11.2 oz (62.914 kg) 12/21/15  140 lb 3.2 oz (63.594 kg) 12/14/15  142 lb 8 oz (64.638 kg) 12/08/15  143 lb 3.2 oz (64.955 kg) Pre-treatment: 12/04/15   Filed Weights   06/06/16 1317  Weight: 140 lb 9.6 oz (63.8 kg)   General: Elderly female in no acute distress.  Accompanied in clinic today by her daughter.  HEENT: Head is normocephalic.  Pupils equal and reactive to  light. Conjunctivae clear without exudate.  Sclerae anicteric. Oral mucosa is dry; tongue is dry.  Patient concerned about increasing rate of growth to tongue tumor to right base of tongue (photos taken today for future comparison; see below); Clinically, I do not notice obvious increase in size from previous exam.   Lymph: There is a palpable round node located in the right submandibular region; clinically remains similar in size from previous as well.   Neck: Scant anterior neck lymphedema.   Cardiovascular: Regular rate and rhythm.  Respiratory: Clear to auscultation bilaterally. Breathing non-labored.   GI: Abdomen soft and round. Non-tender.  Normoactive bowel sounds. G-tube in place. GU: Deferred.   Neuro: No focal deficits. Ambulating with cane today (previously using walker).   Psych: Affect slightly improved from previous.  Extremities: No edema. Skin: Warm and dry.    LABORATORY DATA:  None at this visit.  DIAGNOSTIC IMAGING:  None at this visit.    ASSESSMENT & PLAN:  Ms. Benoist is a very pleasant 82 y.o. 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   1. Cancer of the right base of tongue: Ms. Schmaltz is continuing to recover from palliative radiation therapy. Clinically, I do not think the tongue or lymph node tumors have  increased substantially in size.  I provided reassurance today and took a photo so that we may compare at future visits.  The patient and her daughter agreed to leave the follow-up with me open for now; no additional follow-up scheduled for today.  They know to call me with any new concerns or questions.    2. Pain:  We have talked at length at many appointments about her pain management.  I reinforced the importance of not waiting too long before taking pain medication, as it will take longer for the pain medications to offer her relief.  We discussed scheduling pain medications again, which she is reluctant to do.  "I take the Tylenol when I think I need it, usually at night to help me sleep."  She has required a few doses of the Tramadol recently, which resolves her pain, but she avoids taking them due to concerns for constipation.  She knows to take stool softeners when taking the Tramadol, which she has been doing.    3. Adjustment disorder with depressed mood: Ms. Wolter is continuing to struggle with coping from her illness.  This is quite common in cancer survivors after they complete treatment and have time to begin to process their emotions. We have discussed this many times at previous visits, as well. Lorrin Jackson, [facility] was present during today's visit to offer additional support and expertise.  She is very clear in not wanting to try any anti-depressants, particularly Remeron, right now.  Again, I expressed support and understanding for her decision.  We discussed different approaches she can consider as she transitions to her new apartment in the [facility]isted Living portion of [facility].  She needs increased socialization and interaction with her friends.  She really "lit up" when talking about going to her exercise classes, seeing her friends in the dining hall for socialization, and doing different activities at [facility].  She says that she feels "boxed in" because of her tube feeding  schedule, which has been hard for her as well.  I encouraged her to talk with the dietitian at [facility]  about potentially making adjustments to her feeding schedule to better allow her to participate in more things she enjoys.  We talked about different ideas for dealing with grief during this time of transition.  Much of our visit today was spent in active listening, creating a strategy for additional support for Ms. Dilger, and advocating for her reported needs.  Please see any additional social support documentation from Lorrin Jackson, [facility] as well.     I encouraged Ms. Engelhard and her daughter to ask questions regarding her above plan of care. All questions were answered to her stated satisfaction. They know to call me with additional questions.    Dispo:  -Of note: Code Status-DNR -Patient is welcome to follow-up with me anytime. I have not placed an additional follow-up appt at this time and will leave it open-ended based on the patient's needs/wishes. Will consider bringing her back in about 2 months for quality of life/symptom optimization, if needed.    A total of 75 minutes was spent with this patient in face-to-face care, with greater than 50% of that time in counseling and care-coordination.  Mike Craze, NP [place] [phone]

## 2016-06-24 ENCOUNTER — Telehealth: Payer: Self-pay | Admitting: Adult Health

## 2016-06-24 ENCOUNTER — Telehealth: Payer: Self-pay

## 2016-06-24 ENCOUNTER — Other Ambulatory Visit: Payer: Self-pay | Admitting: Adult Health

## 2016-06-24 DIAGNOSIS — K5900 Constipation, unspecified: Secondary | ICD-10-CM

## 2016-06-24 MED ORDER — FLEET ENEMA 7-19 GM/118ML RE ENEM: 1.0000 | ENEMA | Freq: Every day | RECTAL | 30 refills | Status: DC | PRN

## 2016-06-24 MED ORDER — FLEET ENEMA 7-19 GM/118ML RE ENEM: 1.0000 | ENEMA | Freq: Once | RECTAL | 0 refills | Status: AC

## 2016-06-24 NOTE — Telephone Encounter (Signed)
Please see phone note from patient's Oncologist today   Constipation concerns addressed by specialist, order for enema not sent.

## 2016-06-24 NOTE — Telephone Encounter (Signed)
Okay for fleets enema x 1

## 2016-06-24 NOTE — Telephone Encounter (Signed)
I received return call from [facility] that patient did not have results with Fleets enema and had some bleeding from her rectum (likely from trauma of enema).  Nurse tells me that she has no nausea/vomiting, abdomen is soft, and the patient herself is not uncomfortable.    I encouraged the nurse to give the patient her 2 tabs of Senna tonight, as well as the Miralax and likely her bowels will move tomorrow.  If no results, then they could repeat the enema tomorrow.  I also encouraged the nursing staff to get in touch with the patient's PCP to further manage the patient's constipation, particularly over the holiday weekend.   Nurse voiced understanding and agreed with plan.   Mike Craze, NP [place] [phone]

## 2016-06-24 NOTE — Telephone Encounter (Signed)
I received a call from [facility] from [facility] requesting an order for an enema for Ms. Southall as all oral agents have been unsuccessful in managing her constipation.  Her last BM was reportedly 3 days ago.   I asked Mliss Sax if they had called the patient's PCP, as her constipation is likely unrelated to her cancer history. They stated that they called her PCP, but she is out of the office today.    Therefore, I provided a prescription for Fleets enema x 1, to be repeated as needed, with 30 refills.    Order with prescription faxed to [phone], [facility], for Ms. Kovack.   Mike Craze, NP [place] [phone]

## 2016-06-24 NOTE — Telephone Encounter (Signed)
Message left on triage voicemail. Patient c/o constipation and no bowel movement x 3 days. [employer] underwriter) is requesting rx for an enema   Please advise

## 2016-06-24 NOTE — Progress Notes (Signed)
I received a call from [facility] from [facility] requesting an order for an enema for [name] as all oral agents have been unsuccessful in managing her constipation.  Her last BM was reportedly 3 days ago.   I asked Mliss Sax if they had called the patient's PCP, as her constipation is likely unrelated to her cancer history. They stated that they called her PCP, but she is out of the office today.    Therefore, I provided a prescription for Fleets enema x 1, to be repeated as needed, with 30 refills.    Order with prescription faxed to [phone], [facility] RN, for Ms. Persad.   Mike Craze, NP [place] [phone]

## 2016-07-04 ENCOUNTER — Emergency Department (HOSPITAL_COMMUNITY)
Admission: EM | Admit: 2016-07-04 | Discharge: 2016-07-04 | Disposition: A | Discharge status: Home / Self Care | Payer: Medicare Other | Attending: Emergency Medicine | Admitting: Emergency Medicine

## 2016-07-04 ENCOUNTER — Encounter (HOSPITAL_COMMUNITY): Payer: Self-pay | Admitting: Emergency Medicine

## 2016-07-04 ENCOUNTER — Emergency Department (HOSPITAL_COMMUNITY): Payer: Medicare Other

## 2016-07-04 DIAGNOSIS — S42002A Fracture of unspecified part of left clavicle, initial encounter for closed fracture: Secondary | ICD-10-CM

## 2016-07-04 DIAGNOSIS — I10 Essential (primary) hypertension: Secondary | ICD-10-CM | POA: Diagnosis not present

## 2016-07-04 DIAGNOSIS — Y92002 Bathroom of unspecified non-institutional (private) residence single-family (private) house as the place of occurrence of the external cause: Secondary | ICD-10-CM | POA: Insufficient documentation

## 2016-07-04 DIAGNOSIS — J45909 Unspecified asthma, uncomplicated: Secondary | ICD-10-CM | POA: Diagnosis not present

## 2016-07-04 DIAGNOSIS — S0011XA Contusion of right eyelid and periocular area, initial encounter: Secondary | ICD-10-CM | POA: Insufficient documentation

## 2016-07-04 DIAGNOSIS — Z79899 Other long term (current) drug therapy: Secondary | ICD-10-CM | POA: Insufficient documentation

## 2016-07-04 DIAGNOSIS — W1839XA Other fall on same level, initial encounter: Secondary | ICD-10-CM | POA: Insufficient documentation

## 2016-07-04 DIAGNOSIS — Y999 Unspecified external cause status: Secondary | ICD-10-CM | POA: Diagnosis not present

## 2016-07-04 DIAGNOSIS — T148XXA Other injury of unspecified body region, initial encounter: Secondary | ICD-10-CM

## 2016-07-04 DIAGNOSIS — Y939 Activity, unspecified: Secondary | ICD-10-CM | POA: Insufficient documentation

## 2016-07-04 DIAGNOSIS — W19XXXA Unspecified fall, initial encounter: Secondary | ICD-10-CM

## 2016-07-04 DIAGNOSIS — S4992XA Unspecified injury of left shoulder and upper arm, initial encounter: Secondary | ICD-10-CM | POA: Diagnosis present

## 2016-07-04 DIAGNOSIS — E039 Hypothyroidism, unspecified: Secondary | ICD-10-CM | POA: Diagnosis not present

## 2016-07-04 LAB — CBC WITH DIFFERENTIAL/PLATELET
BASOS ABS: 0 10*3/uL (ref 0.0–0.1)
BASOS PCT: 0 %
EOS PCT: 0 %
Eosinophils Absolute: 0 10*3/uL (ref 0.0–0.7)
HEMATOCRIT: 42.7 % (ref 36.0–46.0)
Hemoglobin: 14 g/dL (ref 12.0–15.0)
LYMPHS PCT: 7 %
Lymphs Abs: 0.8 10*3/uL (ref 0.7–4.0)
MCH: 32.3 pg (ref 26.0–34.0)
MCHC: 32.8 g/dL (ref 30.0–36.0)
MCV: 98.4 fL (ref 78.0–100.0)
MONOS PCT: 7 %
Monocytes Absolute: 0.8 10*3/uL (ref 0.1–1.0)
NEUTROS ABS: 10.3 10*3/uL — AB (ref 1.7–7.7)
Neutrophils Relative %: 86 %
PLATELETS: 394 10*3/uL (ref 150–400)
RBC: 4.34 MIL/uL (ref 3.87–5.11)
RDW: 16.5 % — AB (ref 11.5–15.5)
WBC: 11.9 10*3/uL — ABNORMAL HIGH (ref 4.0–10.5)

## 2016-07-04 LAB — I-STAT CHEM 8, ED
BUN: 30 mg/dL — ABNORMAL HIGH (ref 6–20)
CREATININE: 1.1 mg/dL — AB (ref 0.44–1.00)
Calcium, Ion: 1.17 mmol/L (ref 1.15–1.40)
Chloride: 96 mmol/L — ABNORMAL LOW (ref 101–111)
GLUCOSE: 103 mg/dL — AB (ref 65–99)
HCT: 44 % (ref 36.0–46.0)
HEMOGLOBIN: 15 g/dL (ref 12.0–15.0)
Potassium: 4.3 mmol/L (ref 3.5–5.1)
Sodium: 135 mmol/L (ref 135–145)
TCO2: 31 mmol/L (ref 0–100)

## 2016-07-04 MED ORDER — SODIUM CHLORIDE 0.9 % IV BOLUS (SEPSIS): 1000.0000 mL | Freq: Once | INTRAVENOUS | Status: DC

## 2016-07-04 NOTE — ED Notes (Signed)
Pt transported to CT and Xray exams, IV fluids and lab are delayed.

## 2016-07-04 NOTE — ED Triage Notes (Signed)
Per [facility] pt from [facility]. Had 3 falls last night. Bruise right eye, right face , believes that pt hit head against nightstand. Denies loc. Right shoulder bruise. No obvious deformity. Alert and oriented x 4. Hx tongue cancer. Feeding tube in place, and per Nurse pt was not feeding herself appropriately due to emotional status.

## 2016-07-04 NOTE — ED Provider Notes (Signed)
[facility] DEPT Provider Note   CSN: [id number] Arrival date & time: 07/04/16  0913     History   Chief Complaint Chief Complaint  Patient presents with  . Fall    HPI [name] is a 82 y.o. female.  The history is provided by the patient.  Fall  This is a new problem. Episode onset: 3 times over night. The problem occurs hourly. The problem has not changed since onset.Associated symptoms comments: Pain over the left shoulder from a fall where she hit the floor.  Also mild pain over the right eye where she hit the counter in the bathroom.  No headache or LOC.  No n/v.  Pt gets all tube feeds due to tongue CA and has not been doing them appropriately per her facility.  She states she fell because of feeling lightheaded with standing but attributes it to taking tramadol before bed. The symptoms are aggravated by walking. The symptoms are relieved by rest. She has tried nothing for the symptoms. The treatment provided no relief.    Past Medical History:  Diagnosis Date  . Aortic atherosclerosis ([place]) 08/29/2013   Identified on Aortic ultrasound exam 2008   . Arthritis   . Asthma   . Bilateral claudication of lower limb ([place]) 08/29/2013   Bilateral posterior thighs  . Bullous pemphigoid 02/2015   Dr. Wilhemina Bonito  . Chronic rhinitis   . Hx of radiation therapy 12/16/15- 01/12/16   Base of Tongue and upper neck, Right Lower neck  . Hyperlipidemia   . Hypertension   . Mitral valve disorders   . Nasal polyps   . Osteoporosis   . Unspecified hypothyroidism   . Unspecified urinary incontinence   . Urge incontinence     Patient Active Problem List   Diagnosis Date Noted  . Malignant neoplasm of base of tongue ([place]) 12/07/2015  . Malignant neoplastic disease ([place]) 11/26/2015  . Bullous pemphigoid 07/29/2015  . Aortic atherosclerosis ([place]) 08/29/2013  . Bilateral claudication of lower limb ([place]) 08/29/2013  . Hypothyroidism   . Right hip pain 01/16/2013  . Asthma   .  Hyperlipidemia   . Hypertension   . Urge incontinence   . NASAL POLYP 05/24/2010  . RHINITIS 08/04/2007  . Allergic asthma 08/04/2007    Past Surgical History:  Procedure Laterality Date  . BREAST LUMPECTOMY Right 1953  . BUNIONECTOMY Left 12/1998  . BUNIONECTOMY Right 02/2000  . DIRECT LARYNGOSCOPY N/A 10/30/2015   Procedure: DIRECT LARYNGOSCOPY AND BIOPSY OF TONGUE MASS;  Surgeon: Jerrell Belfast, MD;  Location: [facility];  Service: ENT;  Laterality: N/A;  . EYE SURGERY Bilateral 02/2000   Cataract removal  . milk cyst right breast    . NASAL POLYP SURGERY  03/2002   x2 Dr Wilburn Cornelia  . ROOT CANAL  02/2014  . TONSILLECTOMY    . TOTAL ABDOMINAL HYSTERECTOMY W/ BILATERAL SALPINGOOPHORECTOMY  1970's    OB History    No data available       Home Medications    Prior to Admission medications   Medication Sig Start Date End Date Taking? Authorizing Provider  acetaminophen (TYLENOL) 500 MG tablet Take 1 tablet (500 mg total) by mouth every 6 (six) hours as needed for mild pain. May take 1-2 tablets. Do not exceed 4000 mg per day. 01/01/16   Holley Bouche, NP  cholecalciferol (VITAMIN D) 1000 UNITS tablet Take 2,000 Units by mouth daily.    Historical Provider, MD  levothyroxine (SYNTHROID, LEVOTHROID) 75 MCG  tablet TAKE 1 TABLET BY MOUTH EVERY MORNING 04/19/16   [name] L Reed, DO  methotrexate (RHEUMATREX) 2.5 MG tablet Take by mouth. 07/27/15   Historical Provider, MD  Nutritional Supplements (FEEDING SUPPLEMENT, OSMOLITE 1.5 CAL,) LIQD Begin 120 cc Osmolite 1.5 QID with 60 cc free water before and after each feeding. Day 3, increase to 180 cc Osmolite 1.5 as tolerated. Day 5 increase to 240 cc Osmolite 1.5 as tolerated. Day 7, increase to 240 cc Osmolite 1.5 5 times daily. Flush or drink an additional 240 cc free water daily. Please send feedings and supplies. 12/30/15   [name], MD  polyethylene glycol [facility] / Floria Raveling) packet 17 g by PEG Tube route daily. Reported on 04/21/2016     Historical Provider, MD  predniSONE (DELTASONE) 10 MG tablet Take 20 mg by mouth daily with breakfast.     Historical Provider, MD  sodium phosphate (FLEET) 7-19 GM/118ML ENEM Place 133 mLs (1 enema total) rectally daily as needed. 06/24/16   Holley Bouche, NP  traMADol (ULTRAM) 50 MG tablet Take 75 mg by mouth 2 (two) times daily. Reported on 04/08/2016    Historical Provider, MD    Family History Family History  Problem Relation Age of Onset  . Diabetes Mother     Adult onset  . Arthritis Mother   . Breast cancer Mother 79  . Heart attack Father   . Diabetes Father     Adult onset  . Heart disease Father   . Heart Problems Father     Arrhythmia requiring pacemaker  . Diabetes Brother   . Fibromyalgia Daughter   . Diabetes type II      sibling  . Allergies      1/2 sister(father's side)    Social History Social History  Substance Use Topics  . Smoking status: Never Smoker  . Smokeless tobacco: Never Used     Comment: Positive hx of passive tobacco smoke exposure  . Alcohol use 1.2 oz/week    2 Glasses of wine per week     Comment: 2 wine a week -social     Allergies   Sulfa antibiotics; Ampicillin; Chlorine; Codeine; Macrobid [nitrofurantoin monohydrate macrocrystals]; Rosuvastatin calcium; Statins; and Trimethoprim   Review of Systems Review of Systems  All other systems reviewed and are negative.    Physical Exam Updated Vital Signs BP 143/57 (BP Location: Right Arm)   Pulse 92   Temp 98.2 F (36.8 C) (Oral)   Resp 18   SpO2 96%   Physical Exam  Constitutional: She is oriented to person, place, and time. She appears well-developed and well-nourished. No distress.  HENT:  Head: Normocephalic and atraumatic.    Mouth/Throat: Oropharynx is clear and moist. Mucous membranes are dry.  Mass present on the right side of the anterior neck.  Ecchymosis present over the right eye with mild tenderness over bony structures  Eyes: Conjunctivae and EOM are  normal. Pupils are equal, round, and reactive to light.  eomi without signs of entrapment and vision at baseline  Neck: Normal range of motion. Neck supple.  Cardiovascular: Normal rate, regular rhythm and intact distal pulses.   No murmur heard. Pulmonary/Chest: Effort normal and breath sounds normal. No respiratory distress. She has no wheezes. She has no rales.  Abdominal: Soft. She exhibits no distension. There is no tenderness. There is no rebound and no guarding.  Peg tube present in the left abd  Musculoskeletal: She exhibits tenderness. She exhibits no edema.  Left shoulder: She exhibits decreased range of motion, tenderness and bony tenderness.       Arms: Neurological: She is alert and oriented to person, place, and time.  Skin: Skin is warm and dry. No rash noted. No erythema.  Psychiatric: She has a normal mood and affect. Her behavior is normal.  Nursing note and vitals reviewed.    ED Treatments / Results  Labs (all labs ordered are listed, but only abnormal results are displayed) Labs Reviewed  CBC WITH DIFFERENTIAL/PLATELET  URINALYSIS, ROUTINE W REFLEX MICROSCOPIC (NOT AT [facility])  I-STAT CHEM 8, ED    EKG  EKG Interpretation None       Radiology Dg Shoulder Left  Result Date: 07/04/2016 CLINICAL DATA:  Fall x3 last night with left shoulder pain. EXAM: LEFT SHOULDER - 2+ VIEW COMPARISON:  Chest x-ray 12/25/2013 FINDINGS: Examination demonstrates a comminuted mildly displaced fracture of the distal clavicle. Mild degenerative change of the glenohumeral joint. IMPRESSION: Comminuted mildly displaced distal left clavicle fracture. Electronically Signed   By: Marin Olp M.D.   On: 07/04/2016 09:53   Ct Maxillofacial Wo Contrast  Result Date: 07/04/2016 CLINICAL DATA:  Fall with eye pain. EXAM: CT MAXILLOFACIAL WITHOUT CONTRAST TECHNIQUE: Multidetector CT imaging of the maxillofacial structures was performed. Multiplanar CT image reconstructions were also  generated. A small metallic BB was placed on the right temple in order to reliably differentiate right from left. COMPARISON:  Neck CT 10/12/2015 FINDINGS: Osseous: Negative for fracture or mandibular dislocation. Cervical disc and facet degeneration with C3-4 anterolisthesis. Orbits: No evidence of globe or postseptal swelling. Bilateral cataract resection. Senescent globe calcifications. Sinuses: Status post medial maxillary antrostomies and ethmoidectomies. Chronic right sphenoid sinusitis with complete opacification. Soft tissues: Reticulation over the right jaw and orbit correlating with reported contusion. No opaque foreign body. There is a known tongue malignancy and bilateral cervical metastatic adenopathy. Pattern of disease is similar to recent PET-CT 04/06/2016. Limited intracranial: No evidence of injury. IMPRESSION: 1. Right face contusion without fracture. 2. Known tongue carcinoma with metastatic cervical adenopathy. 3. Chronic right sphenoid sinusitis. Electronically Signed   By: Monte Fantasia M.D.   On: 07/04/2016 10:41    Procedures Procedures (including critical care time)  Medications Ordered in ED Medications  sodium chloride 0.9 % bolus 1,000 mL (not administered)     Initial Impression / Assessment and Plan / ED Course  I have reviewed the triage vital signs and the nursing notes.  Pertinent labs & imaging results that were available during my care of the patient were reviewed by me and considered in my medical decision making (see chart for details).  Clinical Course   Patient with 3 falls last night when she got up to go to the bathroom. She states when she got up to try to go the bathroom she feels dizzy. She attributed to taking tramadol before she went to bed however she also was reported to not be doing her tube feeds as accurately because she has to cancer and has to get all nutrition by tube feeds. Patient does appear mildly dehydrated on exam but has normal mental  status and neurologically intact. She has significant ecchymosis over the right eye and right shoulder. She has no neck pain and does not take anticoagulation. She denies any headache or LOC. Patient has full range of motion of her right shoulder with ecchymosis however significant tenderness over the before meals joint of the left shoulder in the distal clavicle. CBC, Chem-8, UA pending. Patient  given IV fluids. Imaging of the face and shoulder pending  11:03 AM Imaging with distal clavicle fx.  Placed in sling. n facial imaging neg.  12:00 PM Labs without significant sx.  Will d/c home.  Final Clinical Impressions(s) / ED Diagnoses   Final diagnoses:  Fall, initial encounter  Contusion  Clavicle fracture, left, closed, initial encounter    New Prescriptions New Prescriptions   No medications on file     Blanchie Dessert, MD 07/04/16 1202

## 2016-07-04 NOTE — ED Notes (Signed)
Bed: WA17 Expected date:  Expected time:  Means of arrival:  Comments: EMS, Fall, 82 yo

## 2016-07-04 NOTE — ED Notes (Signed)
Total of 5 attempt for IV access, IV fluids will replaced by oral fluids per Dr. Maryan Rued order

## 2016-07-04 NOTE — ED Notes (Signed)
Bed: [facility]ALD Expected date:  Expected time:  Means of arrival:  Comments: 

## 2016-07-04 NOTE — ED Notes (Signed)
Attempted x 3 to obtain IV access and lab, no success. A second RN at bedside  to attempt IV .

## 2016-07-05 ENCOUNTER — Encounter: Payer: Self-pay | Admitting: Internal Medicine

## 2016-07-05 ENCOUNTER — Non-Acute Institutional Stay (SKILLED_NURSING_FACILITY): Payer: Medicare Other | Admitting: Internal Medicine

## 2016-07-05 DIAGNOSIS — Z931 Gastrostomy status: Secondary | ICD-10-CM | POA: Diagnosis not present

## 2016-07-05 DIAGNOSIS — R296 Repeated falls: Secondary | ICD-10-CM

## 2016-07-05 DIAGNOSIS — F329 Major depressive disorder, single episode, unspecified: Secondary | ICD-10-CM

## 2016-07-05 DIAGNOSIS — L120 Bullous pemphigoid: Secondary | ICD-10-CM | POA: Diagnosis not present

## 2016-07-05 DIAGNOSIS — C01 Malignant neoplasm of base of tongue: Secondary | ICD-10-CM | POA: Diagnosis not present

## 2016-07-05 DIAGNOSIS — F32A Depression, unspecified: Secondary | ICD-10-CM

## 2016-07-05 DIAGNOSIS — E860 Dehydration: Secondary | ICD-10-CM | POA: Diagnosis not present

## 2016-07-05 DIAGNOSIS — S42002A Fracture of unspecified part of left clavicle, initial encounter for closed fracture: Secondary | ICD-10-CM | POA: Diagnosis not present

## 2016-07-10 NOTE — Progress Notes (Signed)
Provider:  Rexene Edison. Mariea Clonts, D.O., C.M.D. Location:  [place] Room Number: [place] of Service:  SNF (31)  PCP: Hollace Kinnier, DO Patient Care Team: Gayland Curry, DO as PCP - General (Geriatric Medicine) [facility] Deneise Lever, MD as Consulting Physician (Pulmonary Disease) Jerrell Belfast, MD as Consulting Physician (Otolaryngology) Rolm Bookbinder, MD as Consulting Physician (Dermatology) [name], MD as Attending Physician (Radiation Oncology) Holley Bouche, NP as Nurse Practitioner (Nurse Practitioner)  Extended Emergency Contact Information Primary Emergency Contact: [place] Phone: [phone] Relation: Other Secondary Emergency Contact: Gaudin,[name]          [place], [place] Mobile Phone: [phone] Relation: Daughter  Code Status: DNR Goals of Care: Advanced Directive information Advanced Directives 07/05/2016  Does patient have an advance directive? Yes  Type of Advance Directive Living will;Healthcare Power of Attorney  Does patient want to make changes to advanced directive? -  Copy of advanced directive(s) in chart? Yes  Pre-existing out of facility DNR order (yellow form or pink MOST form) -    Chief Complaint  Patient presents with  . New Admit To SNF    admission to rehab (is in memory care room b/c rehab full)    HPI: Patient is a 82 y.o. 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  In the ED, she was noted to have  some mild dehydration and increased pain/aching and bruising s/p fall.  Her cr was 1.1 up from baseline 0.9 and her intake had been poor as has been the case since her treatments.    For her tube feeding, she is to be on osmolite 5 tube feedings per day with 50ml water flushes and she is to drink 420ml which she does not think she is doing.  There are three beverages setting on her bedside and she's had very little out of any of them.  She did note dry mouth while I was there and I gave her the cup to drink a few sips.  She remains on a dysphagia 3 diet.  RD is following her closely.  When seen, she reports soreness of her shoulders bilaterally, but especially the left around her fractured clavicle.  She says her eye is a little sore.  She is to be getting ice on the shoulder offered to her tid.  She then says she doesn't think it helps.  She also says the tramadol 75mg  po bid doesn't really work that well and it doesn't make her feel well she she takes it.  She does use some of her tylenol ES 1-2 q 6 prn not to exceed 3g per 24h.  Her HCPOA daughter is here and keeps asking if we are giving her different things for her pain, but the patient is refusing it. Mrs. Taher also said, "the problem with being strong and bouncing back is that you don't get as much pity b/c people just think you'll get better."     She has been clearly depressed since her cancer diagnosis.  Prior to any of that  beginning she had clearly not wanted her life prolonged, but then changed her mind about treatments when the palliative XRT was offered (?due to family wishes).  She has said she is not ready to die though.  We did not readdress her goals again today.  She does have her MOST in place that we completed several months ago on 01/12/16:  "Comfort measures, determine use of IVF and abx as situations arise depending on how she is doing at that time, already has PEG tube and was not ready to discuss potential duration of this at  this point.  Do not hospitalize.  DNR.  All three of her daughters were here for this discussion."   I do think we should discuss this again since she recently went to the hospital and her overall QOL has clearly declined since the last meeting.  The pt and her daughter had been unsure if they wanted me to take care of her again b/c of not checking a urine sample previously where she, like most geriatric patients, wound up with a positive sample when another provider ordered it during a period of pt confusion so I did not think we should jump into goals of care today.    Past Medical History:  Diagnosis Date  . Aortic atherosclerosis ([place]) 08/29/2013   Identified on Aortic ultrasound exam 2008   . Arthritis   . Asthma   . Bilateral claudication of lower limb ([place]) 08/29/2013   Bilateral posterior thighs  . Bullous pemphigoid 02/2015   Dr. Wilhemina Bonito  . Chronic rhinitis   . Hx of radiation therapy 12/16/15- 01/12/16   Base of Tongue and upper neck, Right Lower neck  . Hyperlipidemia   . Hypertension   . Mitral valve disorders   . Nasal polyps   . Osteoporosis   . Unspecified hypothyroidism   . Unspecified urinary incontinence   . Urge incontinence    Past Surgical History:  Procedure Laterality Date  . BREAST LUMPECTOMY Right 1953  . BUNIONECTOMY Left 12/1998  . BUNIONECTOMY Right 02/2000  . DIRECT LARYNGOSCOPY N/A 10/30/2015   Procedure: DIRECT LARYNGOSCOPY AND BIOPSY OF TONGUE MASS;  Surgeon: Jerrell Belfast, MD;  Location: [facility];  Service: ENT;  Laterality: N/A;  . EYE SURGERY Bilateral 02/2000   Cataract removal  . milk cyst right breast    . NASAL POLYP SURGERY  03/2002   x2 Dr Wilburn Cornelia  . ROOT CANAL  02/2014  . TONSILLECTOMY    . TOTAL ABDOMINAL HYSTERECTOMY W/ BILATERAL SALPINGOOPHORECTOMY  1970's    Social History   Social History  . Marital status: Widowed    Spouse name: N/A  . Number of children: 3  . Years of education: N/A   Occupational History  . retired  Pharmacist, hospital    Social History Main Topics  . Smoking status: Never Smoker  . Smokeless tobacco: Never Used     Comment: Positive hx of passive tobacco smoke exposure  . Alcohol use 1.2 oz/week    2 Glasses of wine per week     Comment: 2 wine a week -social  . Drug use: No  . Sexual activity: No   Other Topics Concern  . None   Social History Narrative   Widowed, married in [place], spouse died October 20, 1998.    Patient lives in [place] [facility] [place] since 2007.    Patient has a living well.    She is a retired Pharmacist, hospital.    Never smoked  Alcohol: wine twice a week    Exercises 3 times a week    reports that she has never smoked. She has never used smokeless tobacco. She reports that she drinks about 1.2 oz of alcohol per week . She reports that she does not use drugs.  Functional Status Survey:    Family History  Problem Relation Age of Onset  . Diabetes Mother     Adult onset  . Arthritis Mother   . Breast cancer Mother 2  . Heart attack Father   . Diabetes Father     Adult onset  . Heart disease Father   . Heart Problems Father     Arrhythmia requiring pacemaker  . Diabetes Brother   . Fibromyalgia Daughter   . Diabetes type II      sibling  . Allergies      1/2 sister(father's side)    Health Maintenance  Topic Date Due  . TETANUS/TDAP  10/01/1943  . ZOSTAVAX  10/01/1986  . INFLUENZA VACCINE  05/24/2016  . DEXA SCAN  Completed  . PNA vac Low Risk Adult  Completed    Allergies  Allergen Reactions  . Sulfa Antibiotics   . Ampicillin Swelling  . Chlorine   . Codeine   . Macrobid [Nitrofurantoin Monohydrate Macrocrystals]   . Rosuvastatin Calcium Other (See Comments)  . Statins   . Trimethoprim       Medication List       Accurate as of 07/05/16 11:59 PM. Always use your most recent med list.          acetaminophen 500 MG tablet Commonly known as:  TYLENOL Take 1 tablet (500 mg total) by mouth every 6 (six) hours as needed  for mild pain. May take 1-2 tablets. Do not exceed 4000 mg per day.   cholecalciferol 1000 units tablet Commonly known as:  VITAMIN D Take 2,000 Units by mouth daily.   feeding supplement (OSMOLITE 1.5 CAL) Liqd Begin 120 cc Osmolite 1.5 QID with 60 cc free water before and after each feeding. Day 3, increase to 180 cc Osmolite 1.5 as tolerated. Day 5 increase to 240 cc Osmolite 1.5 as tolerated. Day 7, increase to 240 cc Osmolite 1.5 5 times daily. Flush or drink an additional 240 cc free water daily. Please send feedings and supplies.   levothyroxine 75 MCG tablet Commonly known as:  SYNTHROID, LEVOTHROID TAKE 1 TABLET BY MOUTH EVERY MORNING   methotrexate 2.5 MG tablet Commonly known as:  RHEUMATREX Take 30 mg/m2 by mouth once a week.   polyethylene glycol packet Commonly known as:  MIRALAX / GLYCOLAX 17 g by PEG Tube route daily. Reported on 04/21/2016   prednisoLONE 5 MG Tabs tablet Take 10 mg by mouth daily.   sodium phosphate 7-19 GM/118ML Enem Place 133 mLs (1 enema total) rectally daily as needed.   traMADol 50 MG tablet Commonly known as:  ULTRAM Take 75 mg by mouth 2 (two) times daily. Reported on 04/08/2016       Review of Systems  Constitutional: Positive for malaise/fatigue and weight loss. Negative for chills and fever.  HENT: Negative for congestion, ear pain, hearing loss and sore throat.        Tongue pain, dysarthria, dysphagia, loss of taste  Eyes: Negative for blurred vision.       Some right eye pain due to contusion  Respiratory: Negative for cough and shortness of breath.   Cardiovascular: Negative for chest pain, palpitations and leg swelling.  Gastrointestinal:  Negative for abdominal pain, blood in stool, constipation, diarrhea, melena, nausea and vomiting.  Genitourinary: Positive for urgency. Negative for dysuria, flank pain, frequency and hematuria.  Musculoskeletal: Positive for falls, joint pain and myalgias.       Left shoulder, some both  shoulders  Skin: Positive for itching and rash.       Improved bullous  Neurological: Positive for speech change and weakness. Negative for dizziness, tingling, sensory change, focal weakness, loss of consciousness and headaches.  Endo/Heme/Allergies: Bruises/bleeds easily.  Psychiatric/Behavioral: Positive for depression and memory loss. Negative for suicidal ideas. The patient does not have insomnia.        Some intermittent confusion since cancer treatments    Vitals:   07/05/16 1518  BP: 113/65  Pulse: 91  Resp: 17  Temp: 97.1 F (36.2 C)  TempSrc: Oral  SpO2: 92%   There is no height or weight on file to calculate BMI. Physical Exam  Constitutional: She is oriented to person, place, and time. No distress.  HENT:  Right Ear: External ear normal.  Left Ear: External ear normal.  Nose: Nose normal.  Mouth/Throat: Oropharynx is clear and moist.  Ecchymoses around right eye (no visible injury to orbit itself); visible enlargement of right posterior tongue, no thrush  Eyes: Conjunctivae and EOM are normal. Pupils are equal, round, and reactive to light.  Neck: Normal range of motion. Neck supple.  Edema of right side of neck and chin  Cardiovascular: Normal rate, regular rhythm, normal heart sounds and intact distal pulses.   Pulmonary/Chest: Effort normal and breath sounds normal. No respiratory distress. She has no wheezes. She has no rales.  Abdominal: Soft. Bowel sounds are normal. She exhibits no distension and no mass. There is no tenderness. There is no guarding.  PEG in place  Musculoskeletal: She exhibits tenderness.  Using sling left arm  Neurological: She is alert and oriented to person, place, and time.  Skin: Skin is warm and dry. Capillary refill takes less than 2 seconds.  Left clavicular region with ecchymoses, also on right shoulder, right eye  Psychiatric:  Flat affect, expresses frustration and disappointment about her current situation    Labs  reviewed: Basic Metabolic Panel:  Recent Labs  10/23/15 0940  12/30/15 1150  01/07/16 1037 01/11/16 1045 01/11/16 1045 04/06/16 1153 04/06/16 1153 07/04/16 1115  NA 141  --  139  < > 135* 136  --  139  --  135  K 4.3  --  4.1  < > 4.3 4.6  --  4.3  --  4.3  CL 105  --  105  --   --   --   --   --   --  96*  CO2 27  --  26  < > 28 30*  --  29  --   --   GLUCOSE 94  --  90  < > 123 165*  --  84  --  103*  BUN 17  < > 20  < > 24.2 25.8  --  27.5*  --  30*  CREATININE 0.93  < > 0.91  < > 0.9 0.9  --  0.9  --  1.10*  CALCIUM 9.5  --  9.3  < > 9.6 9.8  --  10.3  --   --   MG  --   --   --   < > 1.8 2.2  --  2.2  --   --   PHOS  --   --   --   < >  3.1  --  3.2  --  3.6  --   < > = values in this interval not displayed. Liver Function Tests:  Recent Labs  01/07/16 1037 01/11/16 1045 04/06/16 1153  AST 22 38* 20  ALT 24 47 17  ALKPHOS 67 88 79  BILITOT 0.38 0.32 1.07  PROT 6.4 6.6 7.5  ALBUMIN 2.9* 3.0* 3.7   No results for input(s): LIPASE, AMYLASE in the last 8760 hours. No results for input(s): AMMONIA in the last 8760 hours. CBC:  Recent Labs  10/23/15 0940 12/30/15 1150 07/04/16 1106 07/04/16 1115  WBC 9.9 9.1 11.9*  --   NEUTROABS  --  7.4 10.3*  --   HGB 12.8 13.4 14.0 15.0  HCT 40.7 42.2 42.7 44.0  MCV 99.5 99.3 98.4  --   PLT 390 382 394  --    Cardiac Enzymes: No results for input(s): CKTOTAL, CKMB, CKMBINDEX, TROPONINI in the last 8760 hours. BNP: Invalid input(s): POCBNP No results found for: HGBA1C Lab Results  Component Value Date   TSH 1.609 04/06/2016   No results found for: VITAMINB12 No results found for: FOLATE No results found for: IRON, TIBC, FERRITIN  Imaging and Procedures obtained prior to SNF admission: Dg Shoulder Left  Result Date: 07/04/2016 CLINICAL DATA:  Fall x3 last night with left shoulder pain. EXAM: LEFT SHOULDER - 2+ VIEW COMPARISON:  Chest x-ray 12/25/2013 FINDINGS: Examination demonstrates a comminuted mildly  displaced fracture of the distal clavicle. Mild degenerative change of the glenohumeral joint. IMPRESSION: Comminuted mildly displaced distal left clavicle fracture. Electronically Signed   By: Marin Olp M.D.   On: 07/04/2016 09:53   Ct Maxillofacial Wo Contrast  Result Date: 07/04/2016 CLINICAL DATA:  Fall with eye pain. EXAM: CT MAXILLOFACIAL WITHOUT CONTRAST TECHNIQUE: Multidetector CT imaging of the maxillofacial structures was performed. Multiplanar CT image reconstructions were also generated. A small metallic BB was placed on the right temple in order to reliably differentiate right from left. COMPARISON:  Neck CT 10/12/2015 FINDINGS: Osseous: Negative for fracture or mandibular dislocation. Cervical disc and facet degeneration with C3-4 anterolisthesis. Orbits: No evidence of globe or postseptal swelling. Bilateral cataract resection. Senescent globe calcifications. Sinuses: Status post medial maxillary antrostomies and ethmoidectomies. Chronic right sphenoid sinusitis with complete opacification. Soft tissues: Reticulation over the right jaw and orbit correlating with reported contusion. No opaque foreign body. There is a known tongue malignancy and bilateral cervical metastatic adenopathy. Pattern of disease is similar to recent PET-CT 04/06/2016. Limited intracranial: No evidence of injury. IMPRESSION: 1. Right face contusion without fracture. 2. Known tongue carcinoma with metastatic cervical adenopathy. 3. Chronic right sphenoid sinusitis. Electronically Signed   By: Monte Fantasia M.D.   On: 07/04/2016 10:41    Assessment/Plan 1. Recurrent falls -had 3 falls in 24 hrs -is here for PT, OT now -she has never gotten her full strength back after her ca dx and tx  2. Malignant neoplasm of base of tongue (HCC) -unfortunately, had palliative XRT and it did temporarily shrink the tumor, but it appears to be growing again and affecting her speech -she has been clear she does not want more  treatments -she continues with PEG feedings, free water flushes and oral hydration (which she does not do enough of)  3. Clavicle fracture, left, closed, initial encounter -continue use of sling except during bathing at which point arm is to be kept in the sling position anyway -f/u with Dr. Erlinda Hong, ortho, on 9/25 -cont tylenol and tramadol for  pain, ice also for inflammation/ease pain, could also use topicals -OT will be working with her and can help out with pain mgt also  4. Dehydration -ongoing problem -RD working with her to make sure she gets adequate water intake--pt does not drink even the 480 cc it appears, but staff are going to monitor her input -f/u bmp  5. PEG (percutaneous endoscopic gastrostomy) status ([place]) -cont osmolite tube feeds 5x per day with 60cc water flushes and monitor I/Os to see if she needs this adjusted -has dysphagia 3 diet, but basically does not eat   6. Bullous pemphigoid -continues on methotrexate and prednisone from rheum -not on folic acid at present--would favor adding this to her regimen on all days except the day she takes the MTX  7. Depression -ongoing, she thinks remeron led to her fall a previous time so she will not take it and has refused medication for her mood -continues to follow with Elzie Rings at oncology and wanted her to be aware of her current situation (being in rehab s/p fall with clavicular fx)--pt is to be moving into AL where she can receive more assistance  Family/ staff Communication: Discussed with family.   Discussed with nursing staff.    Labs/tests ordered:  Bmp f/u  Jaia Alonge L. Yessenia Maillet, D.O. [facility] [address], [place] [address] Cell Phone (Mon-Fri 8am-5pm):  [phone] On Call:  [phone] & follow prompts after 5pm & weekends Office Phone:  [phone] Office Fax:  [phone]

## 2016-07-13 ENCOUNTER — Telehealth: Payer: Self-pay | Admitting: Adult Health

## 2016-07-13 NOTE — Telephone Encounter (Signed)
I received a call from [facility], Ms. Weigand's other daughter who lives in [place] and is in [place] for the week to help with her mother.   Beth and the patient wanted to make sure I was aware of some events that have happened and give me an update on how her mom is doing.    She tells me that Ms. Kunkler fell about a week ago and broke her clavicle and got several bruises on her face and body.  She is continuing to recover.  The plan was originally to have the patient transferred to her Assisted Living apartment at [facility]; however since the fall, she will be moved to skilled nursing for a few weeks to regain her strength.  Right now, she is in the "Memory Care" unit, but only because they are waiting for a bed to open up in the skilled nursing/rehab area.  They are hoping that move will happen early next week.  After she recovers, the plan will be for her will still be to move in to the assisted living apartment, once she has gotten a little stronger.   Beth told me that Rodena Piety (the palliative care provider at [facility]?) saw Ms. Shreiner after her fall and they are working to optimize her symptom management, specifically her pain and depression.  They have increased her increase Tylenol dose and she still has the Tramadol available for her to take.  They also discussed starting her on an antidepressant, specifically one that may be beneficial for both her pain and mood.  They had discussed Cymbalta, but unfortunately, the patient cannot swallow capsules.  So, they worked with pharmacy and Ms. Botero has been started on Effexor.  She has had a few days worth so far. Beth tells me that her pain has been very well-controlled in the past 2 days.  They are hopeful (including Ms. Shake) that she will get stronger and be able to have some quality of life improvement in the coming weeks.    Beth shared with me that her mom is also accepting that the pain is related to her treatment for  the cancer and does not necessarily mean the cancer is growing by the day.  This is a positive step per the patient's daughter.    I encouraged her to call me anytime.  I reinforced that I remain available to see them as needed, but have not set-up a formal appt for follow-up and will be on "stand by" if they should need me in the future.  Beth agrees with this plan.  They know to call me, when needed.    I will route this note to Dr. Isidore Moos and Gayleen Orem, RN to make them aware as well.   Mike Craze, NP [place] [phone]

## 2016-07-22 ENCOUNTER — Telehealth: Payer: Self-pay | Admitting: *Deleted

## 2016-07-22 NOTE — Telephone Encounter (Signed)
Oncology Nurse Navigator Documentation  Received return call from [facility], patient's dtr. I explained my call was to confirm her mother is receiving palliative care support. She reported her mother:  Is being seen by Archie Endo, [facility], and working closely with [facility] nursing, with particular focus on pain management r/t recent falls.  Ms. Horak and dtrs very pleased with her support.  Moved to Skilled Nursing at [facility] on Monday, is receiving excellent care. Information shared with Dr. Isidore Moos, Survivorship NP Mike Craze, [name], Palliative Care.  Gayleen Orem, RN, BSN, [facility] [phone]

## 2016-07-25 ENCOUNTER — Non-Acute Institutional Stay (SKILLED_NURSING_FACILITY): Payer: Medicare Other | Admitting: Adult Health

## 2016-07-25 ENCOUNTER — Encounter: Payer: Self-pay | Admitting: Adult Health

## 2016-07-25 DIAGNOSIS — B370 Candidal stomatitis: Secondary | ICD-10-CM | POA: Diagnosis not present

## 2016-07-25 DIAGNOSIS — K1379 Other lesions of oral mucosa: Secondary | ICD-10-CM

## 2016-07-25 NOTE — Progress Notes (Signed)
Provider:  Cindi Carbon, NP Location:  [place] of Service:  SNF (31)  PCP: Cindi Carbon, NP Patient Care Team: Gayland Curry, DO as PCP - General (Geriatric Medicine) [facility] Deneise Lever, MD as Consulting Physician (Pulmonary Disease) Jerrell Belfast, MD as Consulting Physician (Otolaryngology) Rolm Bookbinder, MD as Consulting Physician (Dermatology) [name], MD as Attending Physician (Radiation Oncology) Holley Bouche, NP as Nurse Practitioner (Nurse Practitioner)  Extended Emergency Contact Information Primary Emergency Contact: [place] of [place] Phone: [phone] Relation: Other Secondary Emergency Contact: Ventress,[name]          [place], [place] Mobile Phone: [phone] Relation: Daughter  Code Status: DNR Goals of Care: Advanced Directive information Advanced Directives 07/05/2016  Does patient have an advance directive? Yes  Type of Advance Directive Living will;Healthcare Power of Attorney  Does patient want to make changes to advanced directive? -  Copy of advanced directive(s) in chart? Yes  Pre-existing out of facility DNR order (yellow form or pink MOST form) -    Chief Complaint  Patient presents with  . Acute Visit    tongue swelling    HPI: Patient is a 82 y.o. 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 She is on tube feeding. Today she c/o worsening swelling/pain to her tongue. She has also had noted episodes of trouble speaking due to her tongue CA. She was started on Effexor on 9/18 for depression. She has had no difficulty breathing or rashes since starting this medication. She has a hx of oral thrush and she is currently taking prednisone. She is able to take small sips of water, but states she does not swallow food.  Her current goals of care are comfort based. She is a  DNR with a most form, followed by Palliative care.  Currently she is using ultram and tylenol scheduled for pain.     Past Medical History:  Diagnosis Date  . Aortic atherosclerosis ([place]) 08/29/2013   Identified on Aortic ultrasound exam 2008   . Arthritis   . Asthma   . Bilateral claudication of lower limb ([place]) 08/29/2013   Bilateral posterior thighs  . Bullous pemphigoid 02/2015   Dr. Wilhemina Bonito  . Chronic rhinitis   . Hx of radiation therapy 12/16/15- 01/12/16   Base of Tongue and upper neck, Right Lower neck  . Hyperlipidemia   . Hypertension   . Mitral valve disorders(424.0)   . Nasal polyps   . Osteoporosis   . Unspecified hypothyroidism   . Unspecified urinary incontinence   . Urge incontinence    Past Surgical History:  Procedure Laterality Date  . BREAST LUMPECTOMY Right 1953  . BUNIONECTOMY Left 12/1998  . BUNIONECTOMY Right 02/2000  . DIRECT LARYNGOSCOPY N/A 10/30/2015   Procedure: DIRECT LARYNGOSCOPY AND BIOPSY OF TONGUE MASS;  Surgeon: Jerrell Belfast, MD;  Location: [place];  Service: ENT;  Laterality: N/A;  . EYE SURGERY Bilateral 02/2000   Cataract removal  . milk cyst right breast    . NASAL POLYP SURGERY  03/2002   x2 Dr Wilburn Cornelia  . ROOT CANAL  02/2014  . TONSILLECTOMY    . TOTAL ABDOMINAL HYSTERECTOMY W/ BILATERAL SALPINGOOPHORECTOMY  1970's    Social History   Social History  . Marital status: Widowed    Spouse name: N/A  . Number of children: 3  .  Years of education: N/A   Occupational History  . retired Pharmacist, hospital    Social History Main Topics  . Smoking status: Never Smoker  . Smokeless tobacco: Never Used     Comment: Positive hx of passive tobacco smoke exposure  . Alcohol use 1.2 oz/week    2 Glasses of wine per week     Comment: 2 wine a week -social  . Drug use: No  . Sexual activity: No   Other Topics Concern  . None   Social History Narrative   Widowed, married in [place], spouse died 10-28-98.    Patient lives in [place] at  [place] since 2007.    Patient has a living well.    She is a retired Pharmacist, hospital.    Never smoked   Alcohol: wine twice a week    Exercises 3 times a week    reports that she has never smoked. She has never used smokeless tobacco. She reports that she drinks about 1.2 oz of alcohol per week . She reports that she does not use drugs.  Functional Status Survey:    Family History  Problem Relation Age of Onset  . Diabetes Mother     Adult onset  . Arthritis Mother   . Breast cancer Mother 41  . Heart attack Father   . Diabetes Father     Adult onset  . Heart disease Father   . Heart Problems Father     Arrhythmia requiring pacemaker  . Diabetes Brother   . Fibromyalgia Daughter   . Diabetes type II      sibling  . Allergies      1/2 sister(father's side)    Health Maintenance  Topic Date Due  . TETANUS/TDAP  [birth year]/1945  . ZOSTAVAX  10/01/1986  . INFLUENZA VACCINE  05/24/2016  . DEXA SCAN  Completed  . PNA vac Low Risk Adult  Completed    Allergies  Allergen Reactions  . Sulfa Antibiotics   . Ampicillin Swelling  . Chlorine   . Codeine   . Macrobid [Nitrofurantoin Monohydrate Macrocrystals]   . Rosuvastatin Calcium Other (See Comments)  . Statins   . Trimethoprim       Medication List       Accurate as of 07/25/16 11:59 PM. Always use your most recent med list.          acetaminophen 500 MG tablet Commonly known as:  TYLENOL Take 500 mg by mouth 3 (three) times daily.   cholecalciferol 1000 units tablet Commonly known as:  VITAMIN D Take 2,000 Units by mouth daily.   feeding supplement (OSMOLITE 1.5 CAL) Liqd Begin 120 cc Osmolite 1.5 QID with 60 cc free water before and after each feeding. Day 3, increase to 180 cc Osmolite 1.5 as tolerated. Day 5 increase to 240 cc Osmolite 1.5 as tolerated. Day 7, increase to 240 cc Osmolite 1.5 5 times daily. Flush or drink an additional 240 cc free water daily. Please send feedings and  supplies.   glycerin adult 2 g suppository Place 1 suppository rectally as needed for constipation.   levothyroxine 75 MCG tablet Commonly known as:  SYNTHROID, LEVOTHROID TAKE 1 TABLET BY MOUTH EVERY MORNING   methotrexate 2.5 MG tablet Commonly known as:  RHEUMATREX Take 2.5 mg by mouth once a week. Give 3 tabs BID weekly on Friday   polyethylene glycol packet Commonly known as:  MIRALAX / GLYCOLAX 17 g by PEG Tube route daily. Reported on 04/21/2016  prednisoLONE 5 MG Tabs tablet Take 10 mg by mouth daily. On 9/15, if no new blisters after 2 weeks, may reduce to one 5 mg tablet q day   sennosides-docusate sodium 8.6-50 MG tablet Commonly known as:  SENOKOT-S Take 1 tablet by mouth daily.   traMADol 50 MG tablet Commonly known as:  ULTRAM Take 50 mg by mouth every 6 (six) hours. Hold for sedation. May have an additional 50 mg tab per PEG tube q hs for severe pain.   venlafaxine 25 MG tablet Commonly known as:  EFFEXOR Take 25 mg by mouth 3 (three) times daily with meals.       Review of Systems  Constitutional: Positive for malaise/fatigue and weight loss. Negative for chills and fever.  HENT: Negative for congestion, ear pain, hearing loss and sore throat.        Tongue pain, dysarthria, dysphagia, loss of taste  Eyes: Negative for blurred vision.       Some right eye pain due to contusion  Respiratory: Negative for cough and shortness of breath.   Cardiovascular: Negative for chest pain, palpitations and leg swelling.  Gastrointestinal: Negative for abdominal pain, blood in stool, constipation, diarrhea, melena, nausea and vomiting.  Genitourinary: Positive for urgency. Negative for dysuria, flank pain, frequency and hematuria.  Musculoskeletal: Positive for falls, joint pain and myalgias.       Left shoulder, some both shoulders  Skin: Positive for itching and rash.       Improved bullous  Neurological: Positive for speech change and weakness. Negative for  dizziness, tingling, sensory change, focal weakness, loss of consciousness and headaches.  Endo/Heme/Allergies: Bruises/bleeds easily.  Psychiatric/Behavioral: Positive for depression and memory loss. Negative for suicidal ideas. The patient does not have insomnia.        Some intermittent confusion since cancer treatments    Vitals:   07/25/16 1620  BP: (!) 155/66  Pulse: 81  Resp: 18  Temp: 98.3 F (36.8 C)  SpO2: 93%   There is no height or weight on file to calculate BMI. Physical Exam  Constitutional: No distress.  HENT:  Head: Normocephalic.  Nose: Nose normal.  Mouth/Throat: Uvula is midline. Mucous membranes are not pale and not dry. Oral lesions (edematous, white patch to base of tongue and to the right side.) present. No lacerations. No oropharyngeal exudate or posterior oropharyngeal edema.  Cardiovascular: Regular rhythm.   No murmur heard. Pulmonary/Chest: Effort normal and breath sounds normal. No respiratory distress. She has no wheezes.  Abdominal: Soft.  Skin: She is not diaphoretic.    Labs reviewed: Basic Metabolic Panel:  Recent Labs  10/23/15 0940  12/30/15 1150  01/07/16 1037 01/11/16 1045 01/11/16 1045 04/06/16 1153 04/06/16 1153 07/04/16 1115  NA 141  --  139  < > 135* 136  --  139  --  135  K 4.3  --  4.1  < > 4.3 4.6  --  4.3  --  4.3  CL 105  --  105  --   --   --   --   --   --  96*  CO2 27  --  26  < > 28 30*  --  29  --   --   GLUCOSE 94  --  90  < > 123 165*  --  84  --  103*  BUN 17  < > 20  < > 24.2 25.8  --  27.5*  --  30*  CREATININE 0.93  < >  0.91  < > 0.9 0.9  --  0.9  --  1.10*  CALCIUM 9.5  --  9.3  < > 9.6 9.8  --  10.3  --   --   MG  --   --   --   < > 1.8 2.2  --  2.2  --   --   PHOS  --   --   --   < > 3.1  --  3.2  --  3.6  --   < > = values in this interval not displayed. Liver Function Tests:  Recent Labs  01/07/16 1037 01/11/16 1045 04/06/16 1153  AST 22 38* 20  ALT 24 47 17  ALKPHOS 67 88 79  BILITOT 0.38  0.32 1.07  PROT 6.4 6.6 7.5  ALBUMIN 2.9* 3.0* 3.7   No results for input(s): LIPASE, AMYLASE in the last 8760 hours. No results for input(s): AMMONIA in the last 8760 hours. CBC:  Recent Labs  10/23/15 0940 12/30/15 1150 07/04/16 1106 07/04/16 1115  WBC 9.9 9.1 11.9*  --   NEUTROABS  --  7.4 10.3*  --   HGB 12.8 13.4 14.0 15.0  HCT 40.7 42.2 42.7 44.0  MCV 99.5 99.3 98.4  --   PLT 390 382 394  --    Cardiac Enzymes: No results for input(s): CKTOTAL, CKMB, CKMBINDEX, TROPONINI in the last 8760 hours. BNP: Invalid input(s): POCBNP No results found for: HGBA1C Lab Results  Component Value Date   TSH 1.609 04/06/2016   No results found for: VITAMINB12 No results found for: FOLATE No results found for: IRON, TIBC, FERRITIN  Imaging and Procedures obtained prior to SNF admission: Dg Shoulder Left  Result Date: 07/04/2016 CLINICAL DATA:  Fall x3 last night with left shoulder pain. EXAM: LEFT SHOULDER - 2+ VIEW COMPARISON:  Chest x-ray 12/25/2013 FINDINGS: Examination demonstrates a comminuted mildly displaced fracture of the distal clavicle. Mild degenerative change of the glenohumeral joint. IMPRESSION: Comminuted mildly displaced distal left clavicle fracture. Electronically Signed   By: Marin Olp M.D.   On: 07/04/2016 09:53   Ct Maxillofacial Wo Contrast  Result Date: 07/04/2016 CLINICAL DATA:  Fall with eye pain. EXAM: CT MAXILLOFACIAL WITHOUT CONTRAST TECHNIQUE: Multidetector CT imaging of the maxillofacial structures was performed. Multiplanar CT image reconstructions were also generated. A small metallic BB was placed on the right temple in order to reliably differentiate right from left. COMPARISON:  Neck CT 10/12/2015 FINDINGS: Osseous: Negative for fracture or mandibular dislocation. Cervical disc and facet degeneration with C3-4 anterolisthesis. Orbits: No evidence of globe or postseptal swelling. Bilateral cataract resection. Senescent globe calcifications. Sinuses:  Status post medial maxillary antrostomies and ethmoidectomies. Chronic right sphenoid sinusitis with complete opacification. Soft tissues: Reticulation over the right jaw and orbit correlating with reported contusion. No opaque foreign body. There is a known tongue malignancy and bilateral cervical metastatic adenopathy. Pattern of disease is similar to recent PET-CT 04/06/2016. Limited intracranial: No evidence of injury. IMPRESSION: 1. Right face contusion without fracture. 2. Known tongue carcinoma with metastatic cervical adenopathy. 3. Chronic right sphenoid sinusitis. Electronically Signed   By: Monte Fantasia M.D.   On: 07/04/2016 10:41    Assessment/Plan  1) Mouth pain  Secondary to oral cancer Begin viscous lidocaine 10 ml q 4 hrs prn pain If no improvement consider stronger agent Continue Ultram 50 mg q 6 hrs and scheduled tylenol  2) Oral thrush Mild case noted to mid base of tongue White patch to the side  of the tongue appears to be cancer related Begin clotrimazole troch 10 mg 5 x a day for 7 days  Cindi Carbon, ANP [facility] [phone]

## 2016-07-29 LAB — BASIC METABOLIC PANEL
BUN: 31 mg/dL — AB (ref 4–21)
Creatinine: 0.8 mg/dL (ref 0.5–1.1)
Glucose: 189 mg/dL
Potassium: 4.7 mmol/L (ref 3.4–5.3)
Sodium: 129 mmol/L — AB (ref 137–147)

## 2016-07-29 LAB — CBC AND DIFFERENTIAL
HEMATOCRIT: 34 % — AB (ref 36–46)
Hemoglobin: 11.9 g/dL — AB (ref 12.0–16.0)
PLATELETS: 456 10*3/uL — AB (ref 150–399)
WBC: 10.6 10^3/mL

## 2016-08-01 ENCOUNTER — Encounter: Payer: Self-pay | Admitting: Adult Health

## 2016-08-01 ENCOUNTER — Telehealth: Payer: Self-pay | Admitting: *Deleted

## 2016-08-01 ENCOUNTER — Non-Acute Institutional Stay (SKILLED_NURSING_FACILITY): Payer: Medicare Other | Admitting: Adult Health

## 2016-08-01 ENCOUNTER — Ambulatory Visit (INDEPENDENT_AMBULATORY_CARE_PROVIDER_SITE_OTHER): Payer: Medicare Other | Admitting: Orthopaedic Surgery

## 2016-08-01 DIAGNOSIS — Z7189 Other specified counseling: Secondary | ICD-10-CM

## 2016-08-01 DIAGNOSIS — K909 Intestinal malabsorption, unspecified: Secondary | ICD-10-CM

## 2016-08-01 DIAGNOSIS — C01 Malignant neoplasm of base of tongue: Secondary | ICD-10-CM

## 2016-08-01 DIAGNOSIS — Z931 Gastrostomy status: Secondary | ICD-10-CM | POA: Diagnosis not present

## 2016-08-01 DIAGNOSIS — R197 Diarrhea, unspecified: Secondary | ICD-10-CM | POA: Diagnosis not present

## 2016-08-01 DIAGNOSIS — S42002D Fracture of unspecified part of left clavicle, subsequent encounter for fracture with routine healing: Secondary | ICD-10-CM | POA: Diagnosis not present

## 2016-08-01 DIAGNOSIS — N3000 Acute cystitis without hematuria: Secondary | ICD-10-CM | POA: Diagnosis not present

## 2016-08-01 DIAGNOSIS — E871 Hypo-osmolality and hyponatremia: Secondary | ICD-10-CM | POA: Diagnosis not present

## 2016-08-01 NOTE — Telephone Encounter (Signed)
Oncology Nurse Navigator Documentation  Received all from Thomes Dinning, Nurse Manager [facility]. He indicated Ms. Hammaker has developed notable neck and some facial swelling. I explained based on his description it is likely she has developed post-radiation lymphedema. I indicated I would follow-up tomorrow with referral guidance to Outpatient Cancer Rehab.  Gayleen Orem, RN, BSN, [place] [facility] [place] [phone]

## 2016-08-01 NOTE — Progress Notes (Signed)
Provider:  Cindi Carbon, NP Location:  [place] of Service:  SNF (31)  PCP: Cindi Carbon, NP Patient Care Team: Gayland Curry, DO as PCP - General (Geriatric Medicine) [facility] Deneise Lever, MD as Consulting Physician (Pulmonary Disease) Jerrell Belfast, MD as Consulting Physician (Otolaryngology) Rolm Bookbinder, MD as Consulting Physician (Dermatology) [name], MD as Attending Physician (Radiation Oncology) Holley Bouche, NP as Nurse Practitioner (Nurse Practitioner)  Extended Emergency Contact Information Primary Emergency Contact: [place] of [place] Phone: [phone] Relation: Other Secondary Emergency Contact: Amara,[name]          [place], [place] of [place] Mobile Phone: [phone] Relation: Daughter  Code Status: DNR Goals of Care: Advanced Directive information Advanced Directives 07/05/2016  Does patient have an advance directive? Yes  Type of Advance Directive Living will;Healthcare Power of Attorney  Does patient want to make changes to advanced directive? -  Copy of advanced directive(s) in chart? Yes  Pre-existing out of facility DNR order (yellow form or pink MOST form) -    Chief Complaint  Patient presents with  . Acute Visit    diarrhea, pos urine, low sodium    HPI: Patient is a 82 y.o. 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 She is on tube feeding, osmolite 5 cans a day.  Staff has reported diarrhea for 2-3 weeks. She denies any abd pain, fever, nausea, or vomiting.  She is no longer receiving treatment for the ca but has been developing more edema on her tongue and sub mandible area. Denies wheezing or trouble breathing.  Her NA was 129 on 10/6, BUN 31. Free water flushes increased to 100 cc before/after feedings. BMP pending. Labs were checked with urine due to lethargy and  confusion on 10/6.  Lethargy has improved, remains depressed.   Denies wheezing and trouble breathing. Currently on prednisone for bullous pemphigoid at 5 mg qd. UA C and S growing 100,000 colonies, final report pending. Performed for confusion and lethargy, family was adamant to test urine    Past Medical History:  Diagnosis Date  . Aortic atherosclerosis ([place]) 08/29/2013   Identified on Aortic ultrasound exam 2008   . Arthritis   . Asthma   . Bilateral claudication of lower limb ([place]) 08/29/2013   Bilateral posterior thighs  . Bullous pemphigoid 02/2015   Dr. Wilhemina Bonito  . Chronic rhinitis   . Hx of radiation therapy 12/16/15- 01/12/16   Base of Tongue and upper neck, Right Lower neck  . Hyperlipidemia   . Hypertension   . Mitral valve disorders(424.0)   . Nasal polyps   . Osteoporosis   . Unspecified hypothyroidism   . Unspecified urinary incontinence   . Urge incontinence    Past Surgical History:  Procedure Laterality Date  . BREAST LUMPECTOMY Right 1953  . BUNIONECTOMY Left 12/1998  . BUNIONECTOMY Right 02/2000  . DIRECT LARYNGOSCOPY N/A 10/30/2015   Procedure: DIRECT LARYNGOSCOPY AND BIOPSY OF TONGUE MASS;  Surgeon: Jerrell Belfast, MD;  Location: [place];  Service: ENT;  Laterality: N/A;  . EYE SURGERY Bilateral 02/2000   Cataract removal  . milk cyst right breast    . NASAL POLYP SURGERY  03/2002   x2 Dr Wilburn Cornelia  . ROOT CANAL  02/2014  . TONSILLECTOMY    . TOTAL ABDOMINAL HYSTERECTOMY W/ BILATERAL SALPINGOOPHORECTOMY  1970's    Social History  Social History  . Marital status: Widowed    Spouse name: N/A  . Number of children: 3  . Years of education: N/A   Occupational History  . retired Pharmacist, hospital    Social History Main Topics  . Smoking status: Never Smoker  . Smokeless tobacco: Never Used     Comment: Positive hx of passive tobacco smoke exposure  . Alcohol use 1.2 oz/week    2 Glasses of wine per week     Comment: 2 wine a week -social  . Drug use: No  .  Sexual activity: No   Other Topics Concern  . None   Social History Narrative   Widowed, married in [place], spouse died 1998/11/07.    Patient lives in [place] since 2007.    Patient has a living well.    She is a retired Pharmacist, hospital.    Never smoked   Alcohol: wine twice a week    Exercises 3 times a week    reports that she has never smoked. She has never used smokeless tobacco. She reports that she drinks about 1.2 oz of alcohol per week . She reports that she does not use drugs.  Functional Status Survey:    Family History  Problem Relation Age of Onset  . Diabetes Mother     Adult onset  . Arthritis Mother   . Breast cancer Mother 26  . Heart attack Father   . Diabetes Father     Adult onset  . Heart disease Father   . Heart Problems Father     Arrhythmia requiring pacemaker  . Diabetes Brother   . Fibromyalgia Daughter   . Diabetes type II      sibling  . Allergies      1/2 sister(father's side)    Health Maintenance  Topic Date Due  . TETANUS/TDAP  10[birth year]/1945  . ZOSTAVAX  10/01/1986  . INFLUENZA VACCINE  05/24/2016  . DEXA SCAN  Completed  . PNA vac Low Risk Adult  Completed    Allergies  Allergen Reactions  . Sulfa Antibiotics   . Ampicillin Swelling  . Chlorine   . Codeine   . Macrobid [Nitrofurantoin Monohydrate Macrocrystals]   . Rosuvastatin Calcium Other (See Comments)  . Statins   . Trimethoprim       Medication List       Accurate as of 08/01/16  2:28 PM. Always use your most recent med list.          acetaminophen 500 MG tablet Commonly known as:  TYLENOL Take 500 mg by mouth 3 (three) times daily.   cholecalciferol 1000 units tablet Commonly known as:  VITAMIN D Take 2,000 Units by mouth daily.   feeding supplement (OSMOLITE 1.5 CAL) Liqd Begin 120 cc Osmolite 1.5 QID with 60 cc free water before and after each feeding. Day 3, increase to 180 cc Osmolite 1.5 as tolerated. Day 5 increase to 240  cc Osmolite 1.5 as tolerated. Day 7, increase to 240 cc Osmolite 1.5 5 times daily. Flush or drink an additional 240 cc free water daily. Please send feedings and supplies.   glycerin adult 2 g suppository Place 1 suppository rectally as needed for constipation.   levothyroxine 75 MCG tablet Commonly known as:  SYNTHROID, LEVOTHROID TAKE 1 TABLET BY MOUTH EVERY MORNING   lidocaine 2 % solution Commonly known as:  XYLOCAINE Use as directed 10 mLs in the mouth or throat every 8 (eight) hours as needed  for mouth pain.   methotrexate 2.5 MG tablet Commonly known as:  RHEUMATREX Take 2.5 mg by mouth once a week. Give 3 tabs BID weekly on Friday   polyethylene glycol packet Commonly known as:  MIRALAX / GLYCOLAX 17 g by PEG Tube route daily as needed. Reported on 04/21/2016   prednisoLONE 5 MG Tabs tablet Take 10 mg by mouth daily. On 9/15, if no new blisters after 2 weeks, may reduce to one 5 mg tablet q day   traMADol 50 MG tablet Commonly known as:  ULTRAM Take 50 mg by mouth every 12 (twelve) hours as needed (pain). Hold for sedation. May have an additional 50 mg tab per PEG tube q hs for severe pain.   venlafaxine 50 MG tablet Commonly known as:  EFFEXOR Take 50 mg by mouth 3 (three) times daily with meals.       Review of Systems  Constitutional: Positive for malaise/fatigue and weight loss. Negative for chills and fever.  HENT: Negative for congestion, ear pain, hearing loss and sore throat.        Tongue pain, dysarthria, dysphagia, loss of taste  Eyes: Negative for blurred vision.       Some right eye pain due to contusion  Respiratory: Negative for cough and shortness of breath.   Cardiovascular: Negative for chest pain, palpitations and leg swelling.  Gastrointestinal: Negative for abdominal pain, blood in stool, constipation, diarrhea, melena, nausea and vomiting.  Genitourinary: Positive for urgency. Negative for dysuria, flank pain, frequency and hematuria.    Musculoskeletal: Positive for falls, joint pain and myalgias.       Left shoulder, some both shoulders  Skin: Negative for itching and rash.       Improved bullous  Neurological: Positive for speech change and weakness. Negative for dizziness, tingling, sensory change, focal weakness, loss of consciousness and headaches.  Endo/Heme/Allergies: Bruises/bleeds easily.  Psychiatric/Behavioral: Positive for depression and memory loss. Negative for suicidal ideas. The patient does not have insomnia.        Some intermittent confusion since cancer treatments    Vitals:   08/01/16 1420  BP: (!) 155/72  Pulse: 94  Resp: 16  Temp: 98.7 F (37.1 C)  SpO2: 95%  Weight: 140 lb (63.5 kg)   Body mass index is 25.61 kg/m. Physical Exam  Constitutional: She is oriented to person, place, and time. No distress.  HENT:  Head: Normocephalic.  Nose: Nose normal.  Mouth/Throat: Uvula is midline. Mucous membranes are not pale and not dry. Oral lesions (edematous, white patch to base of tongue and to the right side.) present. No lacerations. No posterior oropharyngeal edema.  Facial swelling noted to right cheek and sub mandible area. Tongue swelling, white lesion to right side of tongue noted and unchanged but tongue does appear slightly more swollen  Cardiovascular: Regular rhythm.   No murmur heard. Pulmonary/Chest: Effort normal and breath sounds normal. No respiratory distress. She has no wheezes.  Abdominal: Soft. Bowel sounds are normal. She exhibits no distension. There is no tenderness.  Neurological: She is alert and oriented to person, place, and time. No cranial nerve deficit.  Skin: Skin is warm and dry. She is not diaphoretic.  Psychiatric: She has a normal mood and affect.  Nursing note and vitals reviewed.   Labs reviewed: Basic Metabolic Panel:  Recent Labs  10/23/15 0940  12/30/15 1150  01/07/16 1037 01/11/16 1045 01/11/16 1045 04/06/16 1153 04/06/16 1153 07/04/16 1115  07/29/16  NA 141  --  139  < > 135* 136  --  139  --  135 129*  K 4.3  --  4.1  < > 4.3 4.6  --  4.3  --  4.3 4.7  CL 105  --  105  --   --   --   --   --   --  96*  --   CO2 27  --  26  < > 28 30*  --  29  --   --   --   GLUCOSE 94  --  90  < > 123 165*  --  84  --  103*  --   BUN 17  < > 20  < > 24.2 25.8  --  27.5*  --  30* 31*  CREATININE 0.93  < > 0.91  < > 0.9 0.9  --  0.9  --  1.10* 0.8  CALCIUM 9.5  --  9.3  < > 9.6 9.8  --  10.3  --   --   --   MG  --   --   --   < > 1.8 2.2  --  2.2  --   --   --   PHOS  --   --   --   < > 3.1  --  3.2  --  3.6  --   --   < > = values in this interval not displayed. Liver Function Tests:  Recent Labs  01/07/16 1037 01/11/16 1045 04/06/16 1153  AST 22 38* 20  ALT 24 47 17  ALKPHOS 67 88 79  BILITOT 0.38 0.32 1.07  PROT 6.4 6.6 7.5  ALBUMIN 2.9* 3.0* 3.7   No results for input(s): LIPASE, AMYLASE in the last 8760 hours. No results for input(s): AMMONIA in the last 8760 hours. CBC:  Recent Labs  10/23/15 0940 12/30/15 1150 07/04/16 1106 07/04/16 1115 07/29/16  WBC 9.9 9.1 11.9*  --  10.6  NEUTROABS  --  7.4 10.3*  --   --   HGB 12.8 13.4 14.0 15.0 11.9*  HCT 40.7 42.2 42.7 44.0 34*  MCV 99.5 99.3 98.4  --   --   PLT 390 382 394  --  456*   Cardiac Enzymes: No results for input(s): CKTOTAL, CKMB, CKMBINDEX, TROPONINI in the last 8760 hours. BNP: Invalid input(s): POCBNP No results found for: HGBA1C Lab Results  Component Value Date   TSH 1.609 04/06/2016   No results found for: VITAMINB12 No results found for: FOLATE No results found for: IRON, TIBC, FERRITIN  Imaging and Procedures obtained prior to SNF admission: Dg Shoulder Left  Result Date: 07/04/2016 CLINICAL DATA:  Fall x3 last night with left shoulder pain. EXAM: LEFT SHOULDER - 2+ VIEW COMPARISON:  Chest x-ray 12/25/2013 FINDINGS: Examination demonstrates a comminuted mildly displaced fracture of the distal clavicle. Mild degenerative change of the  glenohumeral joint. IMPRESSION: Comminuted mildly displaced distal left clavicle fracture. Electronically Signed   By: Marin Olp M.D.   On: 07/04/2016 09:53   Ct Maxillofacial Wo Contrast  Result Date: 07/04/2016 CLINICAL DATA:  Fall with eye pain. EXAM: CT MAXILLOFACIAL WITHOUT CONTRAST TECHNIQUE: Multidetector CT imaging of the maxillofacial structures was performed. Multiplanar CT image reconstructions were also generated. A small metallic BB was placed on the right temple in order to reliably differentiate right from left. COMPARISON:  Neck CT 10/12/2015 FINDINGS: Osseous: Negative for fracture or mandibular dislocation. Cervical disc and facet degeneration with C3-4 anterolisthesis. Orbits: No  evidence of globe or postseptal swelling. Bilateral cataract resection. Senescent globe calcifications. Sinuses: Status post medial maxillary antrostomies and ethmoidectomies. Chronic right sphenoid sinusitis with complete opacification. Soft tissues: Reticulation over the right jaw and orbit correlating with reported contusion. No opaque foreign body. There is a known tongue malignancy and bilateral cervical metastatic adenopathy. Pattern of disease is similar to recent PET-CT 04/06/2016. Limited intracranial: No evidence of injury. IMPRESSION: 1. Right face contusion without fracture. 2. Known tongue carcinoma with metastatic cervical adenopathy. 3. Chronic right sphenoid sinusitis. Electronically Signed   By: Monte Fantasia M.D.   On: 07/04/2016 10:41    Assessment/Plan 1. Acute cystitis without hematuria Cipro 500 mg BID for 3 days Florastor 1 cap BID for 7 days  2. Hyponatremia Recheck BMP, will need to decrease free water, may need to ad IV NS  3. Status post gastrostomy ([place]) Maintain peg site, change dressing  4. Diarrhea due to malabsorption ? If this is due to the tube feeding Try Jevity 1.2 40 cc/hr continuous Check cdiff x 2  5. Malignant neoplasm of base of tongue (HCC) Increased  swelling ?progression of disease Will refer back to oncology Increase prednisone 20 mg BID for 5 days then return to previous dosing. No signs of resp distress or wheezing but given the facial swelling this seems appropriate.   I discussed her case with Rodena Piety the palliative care team. If her disease is progressing we agreed that she would be most appropriate for Hospice care and may need roxanol to treat her pain/progression. She has a DNR order and has declined further treatment for her illness. Her goals of care are comfort based but she does not want to stop tube feeding at this time.   Cindi Carbon, ANP [facility] [phone]

## 2016-08-03 ENCOUNTER — Telehealth: Payer: Self-pay | Admitting: *Deleted

## 2016-08-03 ENCOUNTER — Other Ambulatory Visit: Payer: Self-pay | Admitting: Radiation Oncology

## 2016-08-03 DIAGNOSIS — C01 Malignant neoplasm of base of tongue: Secondary | ICD-10-CM

## 2016-08-03 NOTE — Telephone Encounter (Signed)
CALLED PATIENT TO INFORM OF PT. APPT. ON 08-05-16 - ARRIVAL TIME - 10 AM, LVM FOR A RETURN CALL

## 2016-08-05 ENCOUNTER — Telehealth: Payer: Self-pay | Admitting: *Deleted

## 2016-08-05 ENCOUNTER — Ambulatory Visit: Payer: Medicare Other | Admitting: Physical Therapy

## 2016-08-05 NOTE — Telephone Encounter (Signed)
CALLED [facility] TO INFORM OF CT FOR 08-11-16- ARRIVAL TIME - 1:45 PM @ [facility], PT. TO BE NPO 4 HRS. PRIOR TO TEST, AND HER FU WITH DR. Isidore Moos ON 08-12-16 @ 1 PM, SP[OKE WITH SAM KING (NURSE ) AND HE IS AWARE OF THESE APPTS.

## 2016-08-08 ENCOUNTER — Telehealth: Payer: Self-pay | Admitting: *Deleted

## 2016-08-08 LAB — BASIC METABOLIC PANEL
BUN: 23 mg/dL — AB (ref 4–21)
Creatinine: 0.7 mg/dL (ref 0.5–1.1)
Glucose: 116 mg/dL
Potassium: 4 mmol/L (ref 3.4–5.3)
Sodium: 137 mmol/L (ref 137–147)

## 2016-08-08 NOTE — Telephone Encounter (Signed)
CALLED [facility] AND SPOKE WITH SAM KING ABOUT NEW APPT. TIME FOR 08-12-16, PATIENT TO ARRIVE 7:50 AM, SAM KING TO NOTIFY DAUGHTER OF THIS APPT.

## 2016-08-09 ENCOUNTER — Telehealth: Payer: Self-pay | Admitting: *Deleted

## 2016-08-09 NOTE — Telephone Encounter (Signed)
Oncology Nurse Navigator Documentation  I received a call from [facility] 11:15 PM last night (i.e. VMM).  She and her other sister Vaughan Basta) feel VERY STRONGLY that their mother's Friday appointment to review results of Thursday's CTs SHOULD NOT be cancelled per sister [name]'s request.  Beth stated "[name] can't bring her mother, [name] is in denial".  [name] has spoken with her mother and Kamelah wants to know results.  [name] has spoken with [facility] Nurse Mgr. Thomes Dinning and he is arranging transportation for Fantasia's appointments.  Beth would like [place] to call her so she can be part of the conversation during the appointment [phone]).   Information shared with Dr. Isidore Moos, appointment to move forward as scheduled per her reply.  [employer] updated.  Gayleen Orem, RN, BSN, [place] [facility] [place] [phone]

## 2016-08-10 NOTE — Progress Notes (Addendum)
Radiation Oncology         [phone] ________________________________  Name: [name] MRN: [id number]  Date: 08/12/2016  DOB: [birth year]  Follow-Up Visit Note  CC: REED, [name], DO  Reed, [name] L, DO  Diagnosis and Prior Radiotherapy:       ICD-9-CM ICD-10-CM   1. 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 Base of tongue and upper neck  / 46 Gy in 20 fractions                        2. Right lower neck / 40 Gy in 20 fractions                        Narrative: The patient returns to today for a routine follow up. On 07/04/16 the patient experienced a fall and presented to the ED. X-ray at that time showed a comminuted mildly displaced distal left clavicle fracture. Maxillofacial CT w/o contrast showed a right face contusion without fracture, known tongue carcinoma with metastatic cervical adenopathy with a pattern of disease similar to the PET-CT on 04/06/16, and chronic right sphenoid sinusitis.  Last night I received a call from radiology that patient's CT Chest showed upper abdominal free air.  I called the nurse supervisor at [facility] and confirmed that patient hasn't had any recent procedures.  Free air per the radiologist would not be normal months after a PEG tube was placed, as in the case of the patient.  [facility] nurse supervisor arranged for patient transport to [facility] ED.  CT of abdomen did not reveal any obvious bowel perforation or emergent issue. She denies abdominal pain.  Unfortunately CT neck and chest showed progressive tongue and neck cancer and likely small asymptomatic lung mets.  Pain issues, if any: She reports constant pain in her tongue area. She is taking Tylenol during the day,  and tramadol before bedtime without much relief. IV fentanyl yesterday in ED did help pain.  Using a feeding tube?: Yes, She is exclusively using her feeding tube. She receives continuous Jevity 24 hours daily.   Weight is only about 5 lb less than months ago  Swallowing issues, if any: other swallows water.    Taste has not returned  Smoking or chewing tobacco? No Using fluoride trays daily? No Last ENT visit was on: None recently Other notable issues, if any:  She has thick saliva present, she does use a portable suction machine at her nursing facility. She reports feeling like she is choking at times.   ALLERGIES:  is allergic to sulfa antibiotics; ampicillin; chlorine; codeine; macrobid [nitrofurantoin monohydrate macrocrystals]; rosuvastatin calcium; statins; and trimethoprim.  Meds: Current Outpatient Prescriptions  Medication Sig Dispense Refill  . acetaminophen (TYLENOL) 160 MG/5ML elixir Take 500 mg by mouth every 6 (six) hours as needed for fever.    Marland Kitchen acetaminophen (TYLENOL) 160 MG/5ML elixir Take 325 mg by mouth every 6 (six) hours.    . cholecalciferol (VITAMIN D) 1000 UNITS tablet Take 2,000 Units by mouth daily.    Marland Kitchen levothyroxine (SYNTHROID, LEVOTHROID) 75 MCG tablet TAKE 1 TABLET BY MOUTH EVERY MORNING 90 tablet 1  . methotrexate (RHEUMATREX) 2.5 MG tablet Take 7.5 mg by mouth once a  week. Friday    . Nutritional Supplements (FEEDING SUPPLEMENT, JEVITY 1.2 CAL,) LIQD Place 1,000 mLs into feeding tube continuous. 60 ml/hr    . predniSONE (DELTASONE) 5 MG tablet Take 5 mg by mouth daily with breakfast.    . saccharomyces boulardii (FLORASTOR) 250 MG capsule Take 250 mg by mouth 2 (two) times daily.    . traMADol (ULTRAM) 50 MG tablet Take 50 mg by mouth every 12 (twelve) hours as needed (pain). Hold for sedation. May have an additional 50 mg tab per PEG tube q hs for severe pain.    Marland Kitchen venlafaxine (EFFEXOR) 50 MG tablet Take 50 mg by mouth 3 (three) times daily with  meals.    . bisacodyl (DULCOLAX) 10 MG suppository Place 10 mg rectally daily as needed for moderate constipation.    . fentaNYL (DURAGESIC - DOSED MCG/HR) 25 MCG/HR patch Place 1 patch (25 mcg total) onto the skin every 3 (three) days. 5 patch 0  . glycerin adult 2 g suppository Place 1 suppository rectally as needed for constipation.    . lidocaine (XYLOCAINE) 2 % solution Use as directed 10 mLs in the mouth or throat every 8 (eight) hours as needed for mouth pain.    . metroNIDAZOLE (FLAGYL) 50 mg/ml oral suspension Take 500 mg by mouth 3 (three) times daily.    . Nutritional Supplements (FEEDING SUPPLEMENT, OSMOLITE 1.5 CAL,) LIQD Begin 120 cc Osmolite 1.5 QID with 60 cc free water before and after each feeding. Day 3, increase to 180 cc Osmolite 1.5 as tolerated. Day 5 increase to 240 cc Osmolite 1.5 as tolerated. Day 7, increase to 240 cc Osmolite 1.5 5 times daily. Flush or drink an additional 240 cc free water daily. Please send feedings and supplies. (Patient not taking: Reported on 08/12/2016) 5 Bottle 0  . polyethylene glycol (MIRALAX / GLYCOLAX) packet 17 g by PEG Tube route daily as needed for moderate constipation. Reported on 04/21/2016     No current facility-administered medications for this encounter.     Physical Findings: The patient is in no acute distress. Patient is alert and oriented. Wt Readings from Last 3 Encounters:  08/12/16 138 lb 9.6 oz (62.9 kg)  08/11/16 138 lb (62.6 kg)  08/01/16 140 lb (63.5 kg)    height is 5\' 2"  (1.575 m) and weight is 138 lb 9.6 oz (62.9 kg). Her temperature is 97.7 F (36.5 C). Her blood pressure is 145/104 (abnormal) and her pulse is 116 (abnormal). Her oxygen saturation is 97%. .  General: Alert and oriented, in wheelchair, mild distress Heart: RRR, no murmurs Lungs: CTAB HEENT: Head is normocephalic.  Thick saliva. Palpable tumor in right oral tongue. It is infiltrative, not exophytic Neck: Enlarged tender masses in right  submandibular region as well as right neck along cervical region. Left lower neck is tender.  Skin has healed well in the treatment fields. There is some right jaw swelling Psychiatric: Judgment and insight are intact. Affect is appropriate. In mild distress   Lab Findings: Lab Results  Component Value Date   WBC 9.7 08/11/2016   HGB 15.3 (H) 08/11/2016   HCT 45.0 08/11/2016   MCV 98.1 08/11/2016   PLT 446 (H) 08/11/2016    Lab Results  Component Value Date   TSH 1.609 04/06/2016    Radiographic Findings: Ct Soft Tissue Neck W Contrast  Result Date: 08/11/2016 CLINICAL DATA:  Restaging base of tongue cancer. Prior radiation therapy. Generalized throat pain. EXAM: CT NECK WITH CONTRAST TECHNIQUE:  Multidetector CT imaging of the neck was performed using the standard protocol following the bolus administration of intravenous contrast. CONTRAST:  56mL ISOVUE-300 IOPAMIDOL (ISOVUE-300) INJECTION 61% COMPARISON:  Maxillofacial CT 07/04/2016. PET-CT 04/06/2016. Neck CT 10/12/2015. FINDINGS: Pharynx and larynx: Previously described tongue base tumor has greatly progressed and now appears as a large, irregular, heterogeneously hyperenhancing the mass measuring at least 4.3 x 4.2 x 5.1 cm (AP x transverse x craniocaudal) with adjacent small satellite foci of enhancement. In addition to involving the tongue base, there is now extensive involvement of the oral tongue with intrinsic and extrinsic tongue muscle involvement and extension on the right greater than the left. There is extension posteriorly on the right across the glossotonsillar sulcus. Mild pharyngeal mucosal edema likely reflects radiation changes. Airway is patent. Salivary glands: Post radiation changes in the parotid and submandibular glands. Thyroid: Unchanged small size of the thyroid gland without focal abnormality identified. Lymph nodes: Marked progression of bilateral cervical lymphadenopathy compared to the prior maxillofacial CT  and head CT. Most lymph nodes demonstrate varying degrees of necrosis. Submental lymph nodes measure up to 10 mm in short axis. Submandibular lymph nodes measure up to 11 mm in short axis. Conglomerate right level II nodal mass measures 2.8 x 2.0 cm with ill-defined margins which could reflect extracapsular extension. Left-sided level II lymph nodes measure up to 2.0 x 1.4 cm. There are numerous small necrotic level II, III, and IV lymph nodes bilaterally. A large necrotic left level IV nodal mass measures 3.0 x 2.1 x 4.3 cm. Small lymph nodes or muscular implants along the right lateral margin of the hyoid measure up to 10 x 6 mm. Vascular: Major vascular structures of the neck appear patent. Lymphadenopathy results in areas of mild-to-moderate internal jugular vein compression bilaterally. Limited intracranial: Unremarkable. Visualized orbits: Prior cataract extraction. Mastoids and visualized paranasal sinuses: Prior functional sinus surgery. Chronic right sphenoid sinusitis with complete sinus opacification. Bilateral maxillary sinus osseous wall thickening from chronic sinusitis. Clear mastoids. Skeleton: No suspicious osseous lesion identified. Advanced multilevel cervical facet arthrosis. Unchanged grade 1 anterolisthesis of C2 on C3 and grade 1 retrolisthesis of C3 on C4. Upper chest: Evaluated on separate dedicated chest CT. Other: None. IMPRESSION: Marked interval progression of primary tongue base tumor and bilateral cervical lymphadenopathy as above. Electronically Signed   By: Logan Bores M.D.   On: 08/11/2016 17:09   Ct Chest W Contrast  Result Date: 08/11/2016 CLINICAL DATA:  Followup base of tongue carcinoma. Previous radiation therapy. Followup right apical pulmonary opacity. EXAM: CT CHEST WITH CONTRAST TECHNIQUE: Multidetector CT imaging of the chest was performed during intravenous contrast administration. CONTRAST:  70mL ISOVUE-300 IOPAMIDOL (ISOVUE-300) INJECTION 61% COMPARISON:  PET-CT  on 04/06/2016 FINDINGS: Cardiovascular: No acute findings. Coronary artery calcification. Aortic atherosclerosis. Mediastinum/Nodes: No masses or pathologically enlarged lymph nodes identified within the mediastinum, hilar, or axillary regions. Lungs/Pleura: Opacity in the lateral right lung apex. cyst, but appears more angular in configuration with internal bronchograms, and is suspicious for radiation pneumonitis. Numerous tiny sub-cm pulmonary nodules are seen throughout both lungs which are new since previous study and suspicious for early pulmonary metastases. No evidence of central endobronchial obstruction or pleural effusion. Upper Abdomen: Percutaneous gastrostomy tube is again seen in place. A small amount of free air is seen within the upper abdomen which is new since previous study. This is of uncertain etiology, but is suspicious for bowel perforation in the absence of recent surgical procedure. Musculoskeletal: No suspicious bone lesions are identified,  however there is a distal left clavicle fracture which appears subacute in age and is new since 04/06/2016. IMPRESSION: Numerous tiny bilateral sub-cm pulmonary nodules which are new since prior exam, and suspicious for early pulmonary metastases. Mild evolution of opacity in the right lung apex, suspicious for radiation pneumonitis. Continued attention recommended on follow-up CT. Small amount of free air within the upper abdomen which is new since previous study. This is suspicious for bowel perforation in the absence of recent surgical procedure. Recommend clinical correlation, and consider abdomen pelvis CT with oral and IV contrast for further evaluation. Subacute distal left clavicle fracture. No definite evidence of bone metastases. Critical Value/emergent results were called by telephone at the time of interpretation on 08/11/2016 at 6:31 pm to Dr. [name] , who verbally acknowledged these results. Electronically Signed   By: Earle Gell  M.D.   On: 08/11/2016 18:31   Ct Abdomen Pelvis W Contrast  Result Date: 08/12/2016 CLINICAL DATA:  Question of free air within the abdomen on CT of the chest. Further evaluation requested. Initial encounter. EXAM: CT ABDOMEN AND PELVIS WITH CONTRAST TECHNIQUE: Multidetector CT imaging of the abdomen and pelvis was performed using the standard protocol following bolus administration of intravenous contrast. CONTRAST:  80 mL of Isovue 300 IV contrast COMPARISON:  CT of the chest performed earlier today at 2:16 p.m. FINDINGS: Lower chest: Mild coronary artery calcification is noted. Scattered pleural-based nodules are noted at the lung bases bilaterally, measuring up to 6 mm in size. Hepatobiliary: The liver is unremarkable in appearance. Material of mildly increased attenuation dependently within the gallbladder likely reflects sludge. The gallbladder is otherwise unremarkable. The common bile duct remains normal in caliber. Pancreas: The pancreas is within normal limits. Spleen: The spleen is unremarkable in appearance. Adrenals/Urinary Tract: The adrenal glands are unremarkable in appearance. The kidneys are within normal limits Bilateral renal cysts are noted, larger on the left, measuring up to 6.5 cm at the lower pole of the left kidney. There is no evidence of hydronephrosis. No renal or ureteral stones are identified. No perinephric stranding is seen. Stomach/Bowel: A G-tube is noted ending at the body of the stomach. The stomach is largely decompressed. The small bowel is within normal limits. The appendix is normal in caliber, without evidence of appendicitis. Diffuse pneumatosis is noted along the ascending colon and cecum, corresponding to the finding noted on chest CT. No abnormal bowel wall thickening or soft tissue inflammation is seen, and the underlying vasculature appears grossly intact. There is no evidence of portal venous gas. This could simply reflect benign pneumatosis, though a variety of  conditions can result in pneumatosis. Vascular/Lymphatic: Scattered calcification is seen along the abdominal aorta and its branches. The abdominal aorta is otherwise grossly unremarkable. The inferior vena cava is grossly unremarkable. No retroperitoneal lymphadenopathy is seen. No pelvic sidewall lymphadenopathy is identified. Reproductive: The bladder is mildly distended and grossly unremarkable. The patient is status post hysterectomy. No suspicious adnexal masses are seen. Other: No additional soft tissue abnormalities are seen. No free intra-abdominal air is seen. Musculoskeletal: No acute osseous abnormalities are identified. Degenerative change is noted at the left side of the pubic symphysis. Multilevel vacuum phenomenon is noted along the lumbar spine. There is grade 2 anterolisthesis of L5 on S1, reflecting chronic bilateral pars defects at L5. The visualized musculature is unremarkable in appearance. IMPRESSION: 1. Diffuse pneumatosis along the ascending colon and cecum, corresponding to the finding noted on chest CT. No abnormal bowel wall thickening  or soft tissue inflammation is seen, and the underlying vasculature appears grossly intact. No evidence of portal venous gas. This could simply reflect benign pneumatosis, though a variety of conditions can result in pneumatosis. 2. No free intra-abdominal air seen. 3. Scattered pleural based nodules at the lung bases bilaterally, measuring up to 6 mm in size, as previously noted. 4. Mild coronary artery calcifications seen. 5. Sludge noted within the gallbladder. Gallbladder otherwise unremarkable. 6. Scattered bilateral renal cysts seen. 7. Scattered aortic atherosclerosis noted. 8. Grade 2 anterolisthesis of L5 on S1, reflecting chronic bilateral pars defects at L5. Degenerative change along the lumbar spine. Electronically Signed   By: Garald Balding M.D.   On: 08/12/2016 02:06    Impression/Plan:  Progressive cancer in neck/mouth and likely lung  metastases.   Fortunately her airway is patent and I reassured her she doesn't appear close to airway issues.  I offered a short course (5 or so fractions) of palliative RT to help her pain.  We discussed the risks and benefits of this - she declines more RT  Instead she will focus on medication management of her pain. I gave her a Rx for fentanyl 25 mcg patches disp 5.  She make soon need this titrated up and have other short acting narcotics started for breakthrough pain.  Detailed note written to [facility] on this.   Recommended baking soda rinses PRN for thick saliva.  Continue flagyl for C Diff diarrhea per [facility].  Thyroid function: WNL Lab Results  Component Value Date   TSH 1.609 04/06/2016   Given her decision not to pursue re irradiation to address her tongue/neck pain, I think that she would stand to improve her QOL with best supportive care and hospice. I think her life expectancy is < 57mo.  Archie Endo NP follows her from [facility] for palliative care.  Patient is unsure about hospice but interested in considering this more seriously with [name] soon.  She and her daughter and I had a length discussion about all the above. She will follow up at the cancer center  PRN.  I also wrote in her note to [facility] that she should be taken to ED if develops severe belly pain (free air on CT).   I wished [name] the best and I hope her pain will improve with Fentanyl +/- other meds from palliative care. That is her number one goal - pain relief.  At least 30 minutes spent face to face with patient, over 50% on coordination of care and counseling.  _____________________________________   [name], M.D.  This document serves as a record of services personally performed by [name], MD. It was created on her behalf by Darcus Austin, a trained medical scribe. The creation of this record is based on the scribe's personal observations and the provider's statements to them. This  document has been checked and approved by the attending provider.

## 2016-08-11 ENCOUNTER — Encounter: Payer: Self-pay | Admitting: Radiation Oncology

## 2016-08-11 ENCOUNTER — Ambulatory Visit (HOSPITAL_COMMUNITY)
Admission: RE | Admit: 2016-08-11 | Discharge: 2016-08-11 | Disposition: A | Discharge status: Home / Self Care | Payer: Medicare Other | Source: Ambulatory Visit | Attending: Radiation Oncology | Admitting: Radiation Oncology

## 2016-08-11 ENCOUNTER — Emergency Department (HOSPITAL_COMMUNITY)
Admission: EM | Admit: 2016-08-11 | Discharge: 2016-08-12 | Disposition: A | Discharge status: Home / Self Care | Payer: Medicare Other | Attending: Emergency Medicine | Admitting: Emergency Medicine

## 2016-08-11 ENCOUNTER — Encounter (HOSPITAL_COMMUNITY): Payer: Self-pay | Admitting: *Deleted

## 2016-08-11 ENCOUNTER — Emergency Department (HOSPITAL_COMMUNITY): Payer: Medicare Other

## 2016-08-11 DIAGNOSIS — Z79899 Other long term (current) drug therapy: Secondary | ICD-10-CM | POA: Diagnosis not present

## 2016-08-11 DIAGNOSIS — S42032A Displaced fracture of lateral end of left clavicle, initial encounter for closed fracture: Secondary | ICD-10-CM

## 2016-08-11 DIAGNOSIS — J45909 Unspecified asthma, uncomplicated: Secondary | ICD-10-CM | POA: Insufficient documentation

## 2016-08-11 DIAGNOSIS — C01 Malignant neoplasm of base of tongue: Secondary | ICD-10-CM | POA: Insufficient documentation

## 2016-08-11 DIAGNOSIS — X58XXXA Exposure to other specified factors, initial encounter: Secondary | ICD-10-CM

## 2016-08-11 DIAGNOSIS — E039 Hypothyroidism, unspecified: Secondary | ICD-10-CM | POA: Diagnosis not present

## 2016-08-11 DIAGNOSIS — Z7722 Contact with and (suspected) exposure to environmental tobacco smoke (acute) (chronic): Secondary | ICD-10-CM | POA: Insufficient documentation

## 2016-08-11 DIAGNOSIS — R590 Localized enlarged lymph nodes: Secondary | ICD-10-CM

## 2016-08-11 DIAGNOSIS — I10 Essential (primary) hypertension: Secondary | ICD-10-CM | POA: Insufficient documentation

## 2016-08-11 DIAGNOSIS — C799 Secondary malignant neoplasm of unspecified site: Secondary | ICD-10-CM

## 2016-08-11 DIAGNOSIS — C069 Malignant neoplasm of mouth, unspecified: Secondary | ICD-10-CM | POA: Diagnosis not present

## 2016-08-11 DIAGNOSIS — R918 Other nonspecific abnormal finding of lung field: Secondary | ICD-10-CM | POA: Insufficient documentation

## 2016-08-11 DIAGNOSIS — R935 Abnormal findings on diagnostic imaging of other abdominal regions, including retroperitoneum: Secondary | ICD-10-CM | POA: Diagnosis present

## 2016-08-11 LAB — CBC WITH DIFFERENTIAL/PLATELET
BASOS PCT: 0 %
Basophils Absolute: 0 10*3/uL (ref 0.0–0.1)
Eosinophils Absolute: 0 10*3/uL (ref 0.0–0.7)
Eosinophils Relative: 0 %
HEMATOCRIT: 40.4 % (ref 36.0–46.0)
HEMOGLOBIN: 13.4 g/dL (ref 12.0–15.0)
LYMPHS PCT: 17 %
Lymphs Abs: 1.7 10*3/uL (ref 0.7–4.0)
MCH: 32.5 pg (ref 26.0–34.0)
MCHC: 33.2 g/dL (ref 30.0–36.0)
MCV: 98.1 fL (ref 78.0–100.0)
MONOS PCT: 12 %
Monocytes Absolute: 1.2 10*3/uL — ABNORMAL HIGH (ref 0.1–1.0)
NEUTROS ABS: 6.8 10*3/uL (ref 1.7–7.7)
NEUTROS PCT: 71 %
Platelets: 446 10*3/uL — ABNORMAL HIGH (ref 150–400)
RBC: 4.12 MIL/uL (ref 3.87–5.11)
RDW: 13.8 % (ref 11.5–15.5)
WBC: 9.7 10*3/uL (ref 4.0–10.5)

## 2016-08-11 LAB — I-STAT CHEM 8, ED
BUN: 28 mg/dL — AB (ref 6–20)
CREATININE: 0.8 mg/dL (ref 0.44–1.00)
Calcium, Ion: 1.13 mmol/L — ABNORMAL LOW (ref 1.15–1.40)
Chloride: 92 mmol/L — ABNORMAL LOW (ref 101–111)
Glucose, Bld: 81 mg/dL (ref 65–99)
HEMATOCRIT: 45 % (ref 36.0–46.0)
Hemoglobin: 15.3 g/dL — ABNORMAL HIGH (ref 12.0–15.0)
POTASSIUM: 3.4 mmol/L — AB (ref 3.5–5.1)
Sodium: 134 mmol/L — ABNORMAL LOW (ref 135–145)
TCO2: 33 mmol/L (ref 0–100)

## 2016-08-11 IMAGING — XA IR PERC PLACEMENT GASTROSTOMY
5 series · 13 of 13 positions shown · non-contrast
Comparison: PET-CT - 11/13/2015

INDICATION: History of mucoid epidermoid carcinoma of the oropharynx. Please
perform per gastrostomy tube placement for enteric nutrition
supplementation as the patient is undergoing radiation therapy.

EXAM:
PULL TROUGH GASTROSTOMY TUBE PLACEMENT

[Series 1: care single · 1 of 1 slices shown (1 of 4)]
[im 1/1]
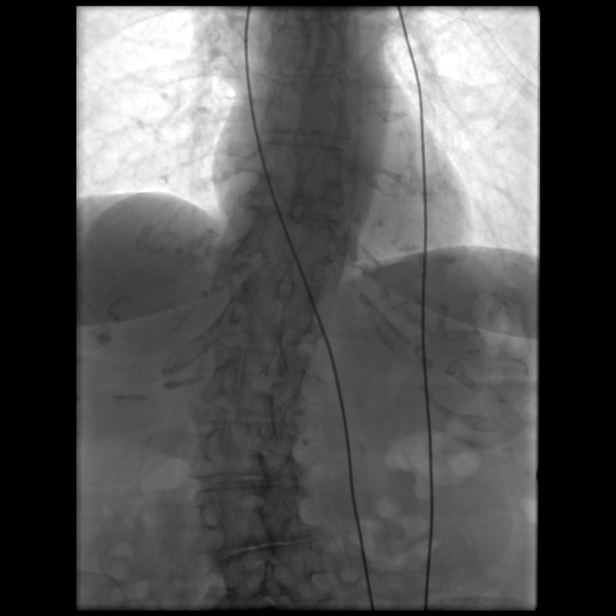

[Series 8: care single · 1 of 1 slices shown (2 of 4)]
[im 1/1]
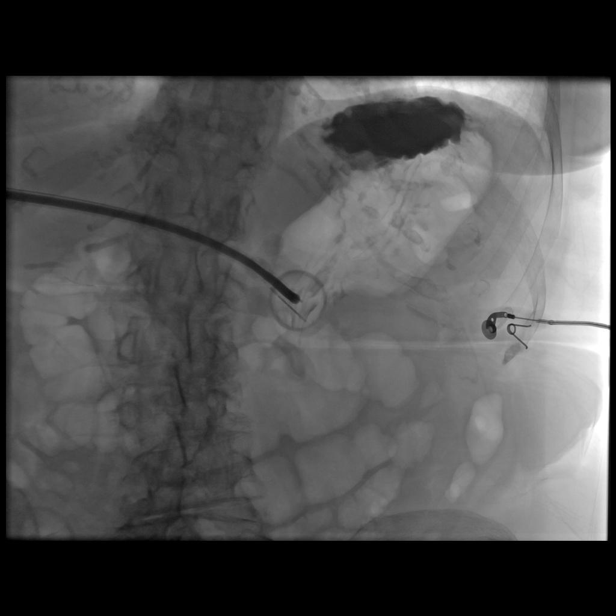

[Series 9: care single · 1 of 1 slices shown (3 of 4)]
[im 1/1]
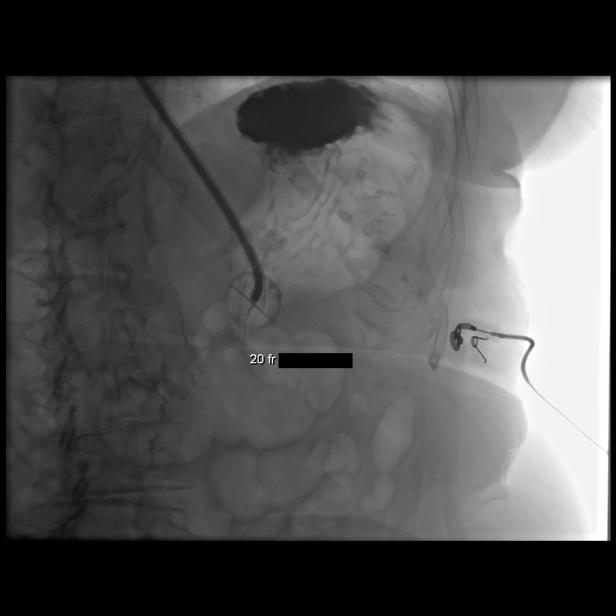

[Series 10: care single · 1 of 1 slices shown (4 of 4)]
[im 1/1]
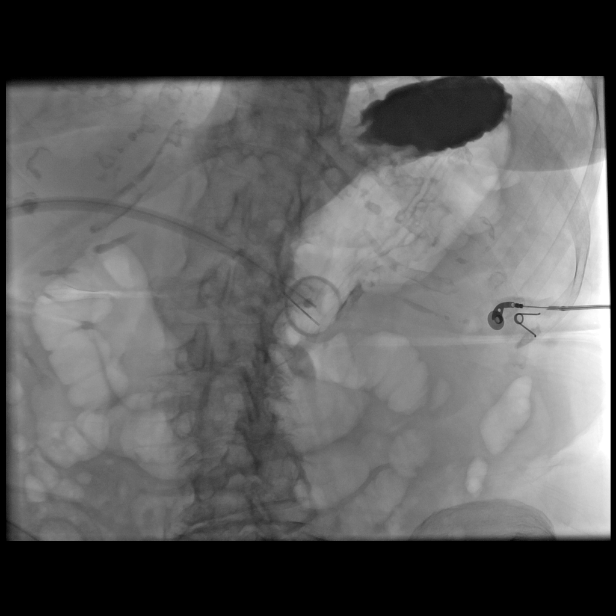

[Series 300: tube placements · 9 of 9 slices shown]
[im 1/9]
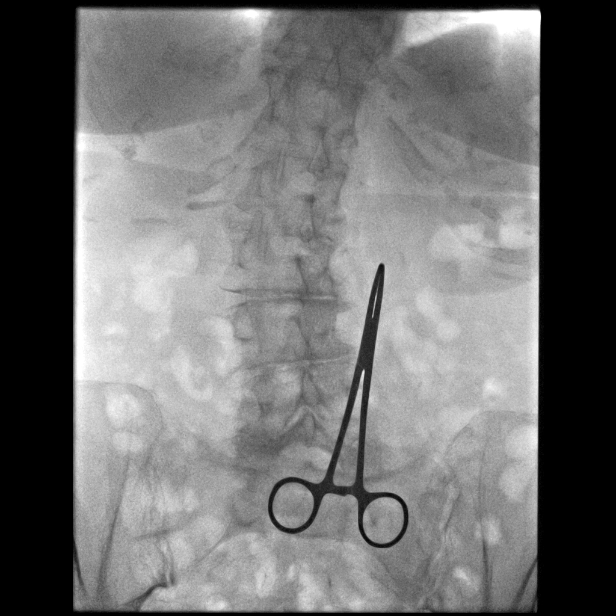
[im 2/9]
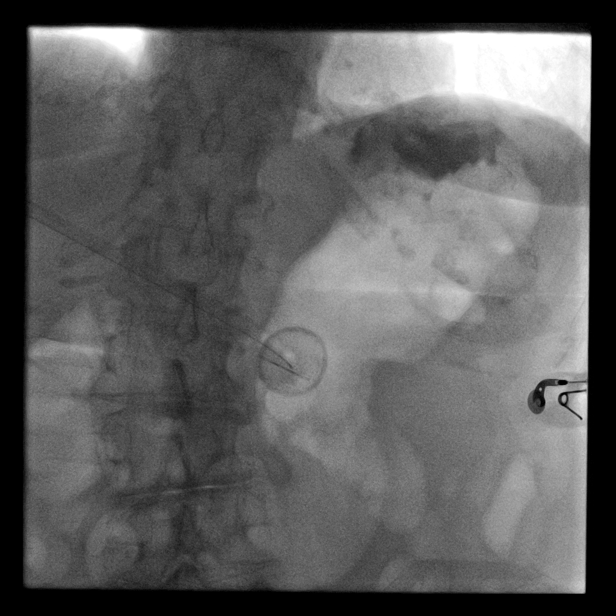
[im 3/9]
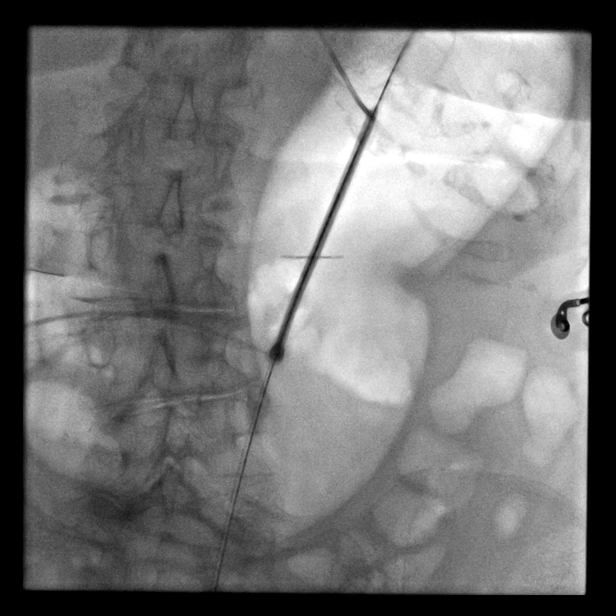
[im 4/9]
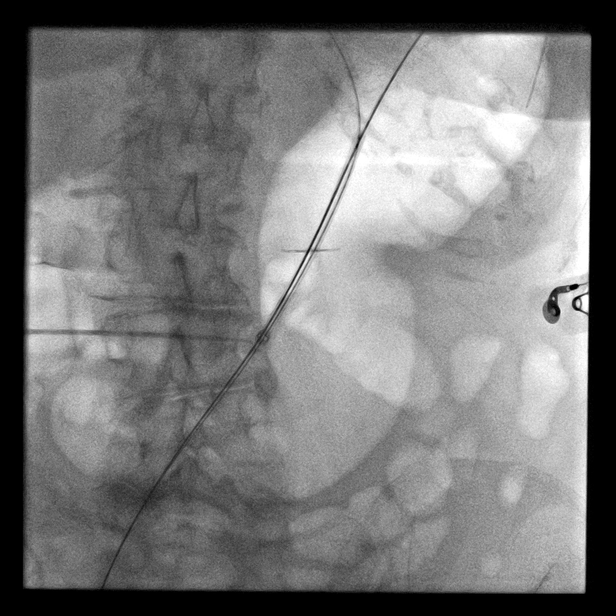
[im 5/9]
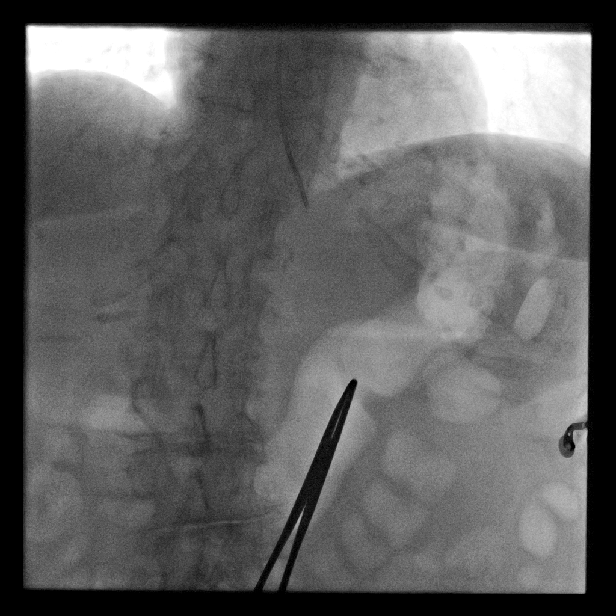
[im 6/9]
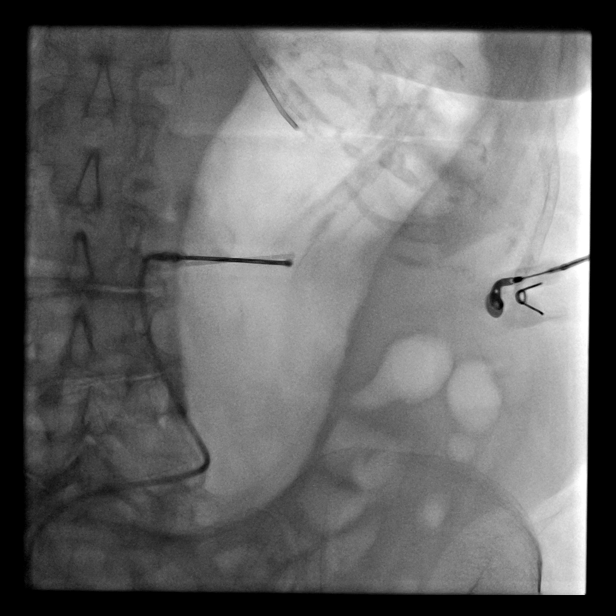
[im 7/9]
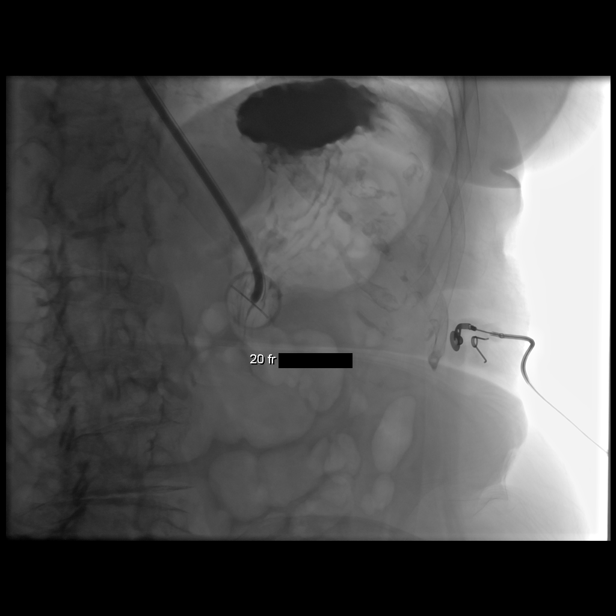
[im 8/9]
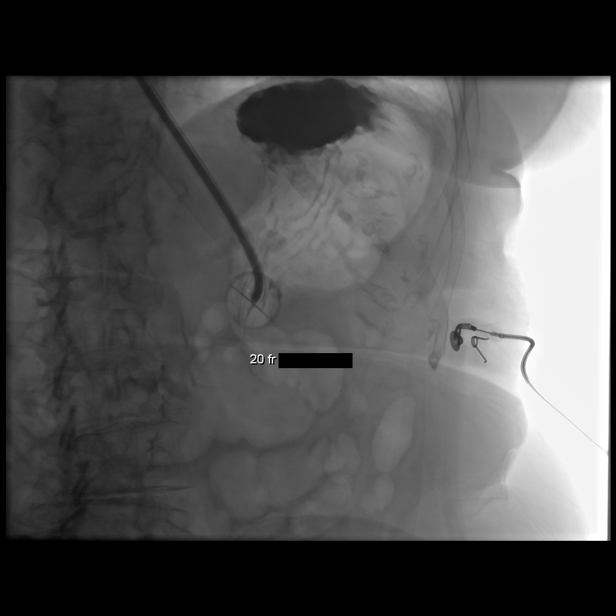
[im 9/9]
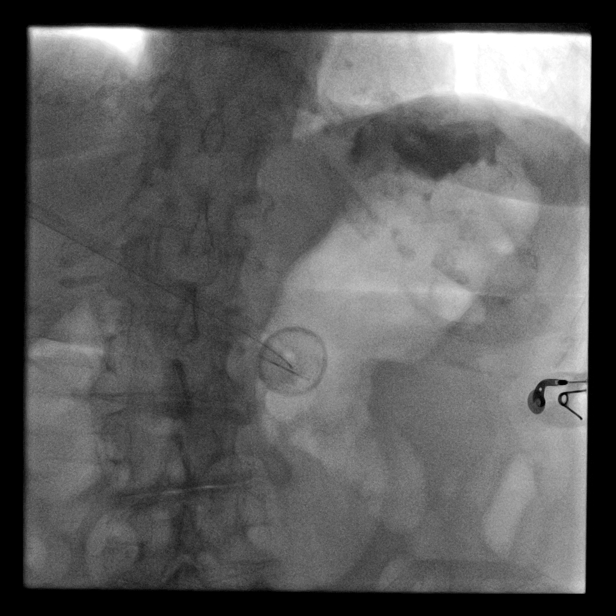

[13 of 13 positions shown; findings below may reference images not displayed]

MEDICATIONS:
Vancomycin 1 gm IV; Antibiotics were administered within 1 hour of
the procedure.

Glucagon 1 mg IV

CONTRAST:  20 mL of Omnipaque 300 administered into the gastric
lumen.

ANESTHESIA/SEDATION:
Moderate (conscious) sedation was employed during this procedure. A
total of Versed 2 mg and Fentanyl 100 mcg was administered
intravenously.

Moderate Sedation Time: 23 minutes. The patient's level of
consciousness and vital signs were monitored continuously by
radiology nursing throughout the procedure under my direct
supervision.

FLUOROSCOPY TIME:  2 minutes 18 seconds (19 mGy)

COMPLICATIONS:
None immediate.

PROCEDURE:
Informed written consent was obtained from the patient following
explanation of the procedure, risks, benefits and alternatives. A
time out was performed prior to the initiation of the procedure.
Ultrasound scanning was performed to demarcate the edge of the left
lobe of the liver. Maximal barrier sterile technique utilized
including caps, mask, sterile gowns, sterile gloves, large sterile
drape, hand hygiene and Betadine prep.

The left upper quadrant was sterilely prepped and draped. An oral
gastric catheter was inserted into the stomach under fluoroscopy.
The existing nasogastric feeding tube was removed. The left costal
margin and air opacified transverse colon were identified and
avoided. Air was injected into the stomach for insufflation and
visualization under fluoroscopy. Under sterile conditions a 17 gauge
trocar needle was utilized to access the stomach percutaneously
beneath the left subcostal margin after the overlying soft tissues
were anesthetized with 1% Lidocaine with epinephrine. Needle
position was confirmed within the stomach with aspiration of air and
injection of small amount of contrast. A single T tack was deployed
for gastropexy. Over an Amplatz guide wire, a 9-French sheath was
inserted into the stomach. A snare device was utilized to capture
the oral gastric catheter. The snare device was pulled retrograde
from the stomach up the esophagus and out the oropharynx. The
20-French pull-through gastrostomy was connected to the snare device
and pulled antegrade through the oropharynx down the esophagus into
the stomach and then through the percutaneous tract external to the
patient. The gastrostomy was assembled externally. Contrast
injection confirms position in the stomach. Several spot
radiographic images were obtained in various obliquities for
documentation. The patient tolerated procedure well without
immediate post procedural complication.
FINDINGS: After successful fluoroscopic guided placement, the gastrostomy tube
is appropriately positioned with internal disc against the ventral
aspect of the gastric lumen.
IMPRESSION: Successful fluoroscopic insertion of a 20-French pull-through
gastrostomy tube.

The gastrostomy may be used immediately for medication
administration and in 24 hrs for the initiation of feeds.

## 2016-08-11 MED ORDER — ACETAMINOPHEN 160 MG/5ML PO SOLN
500.0000 mg | Freq: Once | ORAL | Status: AC
  Administered 2016-08-11: 500 mg
  Filled 2016-08-11: qty 20

## 2016-08-11 MED ORDER — IOPAMIDOL (ISOVUE-300) INJECTION 61%
75.0000 mL | Freq: Once | INTRAVENOUS | Status: AC | PRN
  Administered 2016-08-11: 75 mL via INTRAVENOUS

## 2016-08-11 MED ORDER — IOPAMIDOL (ISOVUE-300) INJECTION 61%: 100.0000 mL | Freq: Once | INTRAVENOUS | Status: DC | PRN

## 2016-08-11 MED ORDER — IOPAMIDOL (ISOVUE-300) INJECTION 61%: 30.0000 mL | Freq: Once | INTRAVENOUS | Status: DC | PRN

## 2016-08-11 MED ORDER — IOPAMIDOL (ISOVUE-300) INJECTION 61%
100.0000 mL | Freq: Once | INTRAVENOUS | Status: AC | PRN
  Administered 2016-08-12: 80 mL via INTRAVENOUS

## 2016-08-11 NOTE — Progress Notes (Addendum)
I received a call from radiology that patient's CT showed abdominal free air.  I called the nurse supervisor at [facility] and confirmed that patient hasn't had any recent procedures.  Free air per the radiologist would not be normal months after a PEG tube was placed, as in the case of the patient.  [facility] nurse supervisor will try to arrange patient transport to [facility] ED. She will also try to reach patient's daughter to inform her of this.  I will see the patient tomorrow.  I called triage nurse at ED /[facility] to let her know patient's status. -----------------------------------  [name], MD

## 2016-08-11 NOTE — ED Notes (Signed)
Bed: [id number] Expected date:  Expected time:  Means of arrival:  Comments: EMS 82 yo female from [facility]/CT scan/contact precautions/diarrhea

## 2016-08-11 NOTE — ED Triage Notes (Signed)
Per [facility], pt from [facility].  Abnormal CT showing air in abd, sent here for further eval.  Hx of C-diff & on precautions, hx of throat ca - Stage IV, DNR & has a PEG.

## 2016-08-11 NOTE — Telephone Encounter (Signed)
Oncology Nurse Navigator Documentation  Returned call to Ms Lippard's dtr [name] to address concerns left on VM for RN Anderson Malta, her request to address concerns with Dr. Isidore Moos.  I noted that Dr. Isidore Moos is unable to speak with her today and I am calling in her place. She expressed concerns re mother's care at [facility]:  Oral pain is primarily d/t poor oral hygiene being provided  Pain is not adequately managed  She's being given unnecessary medications  I supported her concerns, her advocacy for her mother's care.    I explained that it is more appropriate to speak directly with [facility] medical staff since they are caring for her mother.  I assured her they would address her questions.  She indicated she has previously spoken with Thomes Dinning, Nurse Mgr, as well as NP Alyse Low, and found them to be "very helpful".  I encouraged her to continue dialogue with them.  She voiced agreement to do so.  I addressed her question regarding the need for CT scans scheduled for this Thursday, noting the value of understanding if there is disease progression so her care can be adjusted as needed.  She voiced understanding.  She expressed appreciation for my call, noted she "feels better" having talked with me.  Gayleen Orem, RN, BSN, [place] at [place] [phone]

## 2016-08-11 NOTE — ED Notes (Signed)
Contrast given via Peg tube & flushed post ;med.

## 2016-08-11 NOTE — ED Notes (Signed)
Labs to be drawn with start of IV

## 2016-08-11 NOTE — ED Provider Notes (Signed)
[name] DEPT Provider Note   CSN: [id number] Arrival date & time: 08/11/16  2031     History   Chief Complaint Chief Complaint  Patient presents with  . Abnormal CT results    HPI [name] is a 82 y.o. female.  The history is provided by the patient.  Patient presents after a CT scan of the chest showed free air in the abdomen. She had a CT scan of her neck and chest were evaluated her throat/tongue cancer. The cancer appears to worsened. She is not on active treatment for it, and has a PEG tube. The PEG tube was placed a few months ago. She has had a fair amount of diarrhea. Reportedly may be C. difficile positive. No real abdominal pain. No fevers. Sent in for CT scan of the abdomen and pelvis.  Past Medical History:  Diagnosis Date  . Aortic atherosclerosis ([place]) 08/29/2013   Identified on Aortic ultrasound exam 2008   . Arthritis   . Asthma   . Bilateral claudication of lower limb ([place]) 08/29/2013   Bilateral posterior thighs  . Bullous pemphigoid 02/2015   Dr. Wilhemina Bonito  . Chronic rhinitis   . Hx of radiation therapy 12/16/15- 01/12/16   Base of Tongue and upper neck, Right Lower neck  . Hyperlipidemia   . Hypertension   . Mitral valve disorders(424.0)   . Nasal polyps   . Osteoporosis   . Unspecified hypothyroidism   . Unspecified urinary incontinence   . Urge incontinence     Patient Active Problem List   Diagnosis Date Noted  . Malignant neoplasm of base of tongue ([place]) 12/07/2015  . Malignant neoplastic disease ([place]) 11/26/2015  . Bullous pemphigoid 07/29/2015  . Aortic atherosclerosis ([place]) 08/29/2013  . Bilateral claudication of lower limb ([place]) 08/29/2013  . Hypothyroidism   . Right hip pain 01/16/2013  . Asthma   . Hyperlipidemia   . Hypertension   . Urge incontinence   . NASAL POLYP 05/24/2010  . RHINITIS 08/04/2007  . Allergic asthma 08/04/2007    Past Surgical History:  Procedure Laterality Date  . BREAST LUMPECTOMY Right 1953    . BUNIONECTOMY Left 12/1998  . BUNIONECTOMY Right 02/2000  . DIRECT LARYNGOSCOPY N/A 10/30/2015   Procedure: DIRECT LARYNGOSCOPY AND BIOPSY OF TONGUE MASS;  Surgeon: Jerrell Belfast, MD;  Location: [facility];  Service: ENT;  Laterality: N/A;  . EYE SURGERY Bilateral 02/2000   Cataract removal  . milk cyst right breast    . NASAL POLYP SURGERY  03/2002   x2 Dr Wilburn Cornelia  . ROOT CANAL  02/2014  . TONSILLECTOMY    . TOTAL ABDOMINAL HYSTERECTOMY W/ BILATERAL SALPINGOOPHORECTOMY  1970's    OB History    No data available       Home Medications    Prior to Admission medications   Medication Sig Start Date End Date Taking? Authorizing Provider  acetaminophen (TYLENOL) 160 MG/5ML elixir Take 500 mg by mouth every 6 (six) hours as needed for fever.   Yes Historical Provider, MD  acetaminophen (TYLENOL) 160 MG/5ML elixir Take 325 mg by mouth every 6 (six) hours.   Yes Historical Provider, MD  bisacodyl (DULCOLAX) 10 MG suppository Place 10 mg rectally daily as needed for moderate constipation.   Yes Historical Provider, MD  cholecalciferol (VITAMIN D) 1000 UNITS tablet Take 2,000 Units by mouth daily.   Yes Historical Provider, MD  glycerin adult 2 g suppository Place 1 suppository rectally as needed for constipation.   Yes  Historical Provider, MD  levothyroxine (SYNTHROID, LEVOTHROID) 75 MCG tablet TAKE 1 TABLET BY MOUTH EVERY MORNING 04/19/16  Yes [name] L Reed, DO  lidocaine (XYLOCAINE) 2 % solution Use as directed 10 mLs in the mouth or throat every 8 (eight) hours as needed for mouth pain.   Yes Historical Provider, MD  methotrexate (RHEUMATREX) 2.5 MG tablet Take 7.5 mg by mouth once a week. Friday 07/27/15  Yes Historical Provider, MD  metroNIDAZOLE (FLAGYL) 50 mg/ml oral suspension Take 500 mg by mouth 3 (three) times daily.   Yes Historical Provider, MD  Nutritional Supplements (FEEDING SUPPLEMENT, JEVITY 1.2 CAL,) LIQD Place 1,000 mLs into feeding tube continuous. 60 ml/hr   Yes Historical  Provider, MD  polyethylene glycol (MIRALAX / GLYCOLAX) packet 17 g by PEG Tube route daily as needed for moderate constipation. Reported on 04/21/2016   Yes Historical Provider, MD  predniSONE (DELTASONE) 5 MG tablet Take 5 mg by mouth daily with breakfast.   Yes Historical Provider, MD  saccharomyces boulardii (FLORASTOR) 250 MG capsule Take 250 mg by mouth 2 (two) times daily.   Yes Historical Provider, MD  traMADol (ULTRAM) 50 MG tablet Take 50 mg by mouth every 12 (twelve) hours as needed (pain). Hold for sedation. May have an additional 50 mg tab per PEG tube q hs for severe pain.   Yes Historical Provider, MD  venlafaxine (EFFEXOR) 50 MG tablet Take 50 mg by mouth 3 (three) times daily with meals.   Yes Historical Provider, MD  Nutritional Supplements (FEEDING SUPPLEMENT, OSMOLITE 1.5 CAL,) LIQD Begin 120 cc Osmolite 1.5 QID with 60 cc free water before and after each feeding. Day 3, increase to 180 cc Osmolite 1.5 as tolerated. Day 5 increase to 240 cc Osmolite 1.5 as tolerated. Day 7, increase to 240 cc Osmolite 1.5 5 times daily. Flush or drink an additional 240 cc free water daily. Please send feedings and supplies. Patient not taking: Reported on 08/11/2016 12/30/15   [name], MD    Family History Family History  Problem Relation Age of Onset  . Diabetes Mother     Adult onset  . Arthritis Mother   . Breast cancer Mother 23  . Heart attack Father   . Diabetes Father     Adult onset  . Heart disease Father   . Heart Problems Father     Arrhythmia requiring pacemaker  . Diabetes Brother   . Fibromyalgia Daughter   . Diabetes type II      sibling  . Allergies      1/2 sister(father's side)    Social History Social History  Substance Use Topics  . Smoking status: Never Smoker  . Smokeless tobacco: Never Used     Comment: Positive hx of passive tobacco smoke exposure  . Alcohol use No     Allergies   Sulfa antibiotics; Ampicillin; Chlorine; Codeine; Macrobid  [nitrofurantoin monohydrate macrocrystals]; Rosuvastatin calcium; Statins; and Trimethoprim   Review of Systems Review of Systems  Constitutional: Negative for appetite change and fever.  HENT: Positive for trouble swallowing.   Respiratory: Negative for chest tightness.   Cardiovascular: Negative for chest pain.  Gastrointestinal: Positive for diarrhea. Negative for abdominal pain.  Genitourinary: Negative for dyspareunia.  Musculoskeletal: Negative for back pain.  Skin: Negative for wound.     Physical Exam Updated Vital Signs BP 192/78 (BP Location: Left Arm)   Pulse 93   Temp 97.9 F (36.6 C) (Oral)   Resp 12   Ht 5\' 2"  (  1.575 m)   Wt 138 lb (62.6 kg)   SpO2 94%   BMI 25.24 kg/m   Physical Exam  Constitutional: She appears well-developed.  HENT:  Head: Atraumatic.   Slurred speech with swelling of tongue.  Neck: Neck supple.  Cardiovascular: Normal rate.   Pulmonary/Chest: Effort normal.  Abdominal: Soft.  Mild left lower quadrant tenderness. PEG tube in left upper abdomen.  Neurological: She is alert.  Skin: Skin is warm. Capillary refill takes less than 2 seconds.  Psychiatric: She has a normal mood and affect.     ED Treatments / Results  Labs (all labs ordered are listed, but only abnormal results are displayed) Labs Reviewed  CBC WITH DIFFERENTIAL/PLATELET - Abnormal; Notable for the following:       Result Value   Platelets 446 (*)    Monocytes Absolute 1.2 (*)    All other components within normal limits  I-STAT CHEM 8, ED - Abnormal; Notable for the following:    Sodium 134 (*)    Potassium 3.4 (*)    Chloride 92 (*)    BUN 28 (*)    Calcium, Ion 1.13 (*)    Hemoglobin 15.3 (*)    All other components within normal limits    EKG  EKG Interpretation None       Radiology Ct Soft Tissue Neck W Contrast  Result Date: 08/11/2016 CLINICAL DATA:  Restaging base of tongue cancer. Prior radiation therapy. Generalized throat pain. EXAM: CT  NECK WITH CONTRAST TECHNIQUE: Multidetector CT imaging of the neck was performed using the standard protocol following the bolus administration of intravenous contrast. CONTRAST:  69mL ISOVUE-300 IOPAMIDOL (ISOVUE-300) INJECTION 61% COMPARISON:  Maxillofacial CT 07/04/2016. PET-CT 04/06/2016. Neck CT 10/12/2015. FINDINGS: Pharynx and larynx: Previously described tongue base tumor has greatly progressed and now appears as a large, irregular, heterogeneously hyperenhancing the mass measuring at least 4.3 x 4.2 x 5.1 cm (AP x transverse x craniocaudal) with adjacent small satellite foci of enhancement. In addition to involving the tongue base, there is now extensive involvement of the oral tongue with intrinsic and extrinsic tongue muscle involvement and extension on the right greater than the left. There is extension posteriorly on the right across the glossotonsillar sulcus. Mild pharyngeal mucosal edema likely reflects radiation changes. Airway is patent. Salivary glands: Post radiation changes in the parotid and submandibular glands. Thyroid: Unchanged small size of the thyroid gland without focal abnormality identified. Lymph nodes: Marked progression of bilateral cervical lymphadenopathy compared to the prior maxillofacial CT and head CT. Most lymph nodes demonstrate varying degrees of necrosis. Submental lymph nodes measure up to 10 mm in short axis. Submandibular lymph nodes measure up to 11 mm in short axis. Conglomerate right level II nodal mass measures 2.8 x 2.0 cm with ill-defined margins which could reflect extracapsular extension. Left-sided level II lymph nodes measure up to 2.0 x 1.4 cm. There are numerous small necrotic level II, III, and IV lymph nodes bilaterally. A large necrotic left level IV nodal mass measures 3.0 x 2.1 x 4.3 cm. Small lymph nodes or muscular implants along the right lateral margin of the hyoid measure up to 10 x 6 mm. Vascular: Major vascular structures of the neck appear  patent. Lymphadenopathy results in areas of mild-to-moderate internal jugular vein compression bilaterally. Limited intracranial: Unremarkable. Visualized orbits: Prior cataract extraction. Mastoids and visualized paranasal sinuses: Prior functional sinus surgery. Chronic right sphenoid sinusitis with complete sinus opacification. Bilateral maxillary sinus osseous wall thickening from chronic sinusitis. Clear  mastoids. Skeleton: No suspicious osseous lesion identified. Advanced multilevel cervical facet arthrosis. Unchanged grade 1 anterolisthesis of C2 on C3 and grade 1 retrolisthesis of C3 on C4. Upper chest: Evaluated on separate dedicated chest CT. Other: None. IMPRESSION: Marked interval progression of primary tongue base tumor and bilateral cervical lymphadenopathy as above. Electronically Signed   By: Logan Bores M.D.   On: 08/11/2016 17:09   Ct Chest W Contrast  Result Date: 08/11/2016 CLINICAL DATA:  Followup base of tongue carcinoma. Previous radiation therapy. Followup right apical pulmonary opacity. EXAM: CT CHEST WITH CONTRAST TECHNIQUE: Multidetector CT imaging of the chest was performed during intravenous contrast administration. CONTRAST:  58mL ISOVUE-300 IOPAMIDOL (ISOVUE-300) INJECTION 61% COMPARISON:  PET-CT on 04/06/2016 FINDINGS: Cardiovascular: No acute findings. Coronary artery calcification. Aortic atherosclerosis. Mediastinum/Nodes: No masses or pathologically enlarged lymph nodes identified within the mediastinum, hilar, or axillary regions. Lungs/Pleura: Opacity in the lateral right lung apex. cyst, but appears more angular in configuration with internal bronchograms, and is suspicious for radiation pneumonitis. Numerous tiny sub-cm pulmonary nodules are seen throughout both lungs which are new since previous study and suspicious for early pulmonary metastases. No evidence of central endobronchial obstruction or pleural effusion. Upper Abdomen: Percutaneous gastrostomy tube is again  seen in place. A small amount of free air is seen within the upper abdomen which is new since previous study. This is of uncertain etiology, but is suspicious for bowel perforation in the absence of recent surgical procedure. Musculoskeletal: No suspicious bone lesions are identified, however there is a distal left clavicle fracture which appears subacute in age and is new since 04/06/2016. IMPRESSION: Numerous tiny bilateral sub-cm pulmonary nodules which are new since prior exam, and suspicious for early pulmonary metastases. Mild evolution of opacity in the right lung apex, suspicious for radiation pneumonitis. Continued attention recommended on follow-up CT. Small amount of free air within the upper abdomen which is new since previous study. This is suspicious for bowel perforation in the absence of recent surgical procedure. Recommend clinical correlation, and consider abdomen pelvis CT with oral and IV contrast for further evaluation. Subacute distal left clavicle fracture. No definite evidence of bone metastases. Critical Value/emergent results were called by telephone at the time of interpretation on 08/11/2016 at 6:31 pm to Dr. [name] , who verbally acknowledged these results. Electronically Signed   By: Earle Gell M.D.   On: 08/11/2016 18:31    Procedures Procedures (including critical care time)  Medications Ordered in ED Medications  iopamidol (ISOVUE-300) 61 % injection 30 mL (not administered)  iopamidol (ISOVUE-300) 61 % injection 100 mL (80 mLs Intravenous Contrast Given 08/12/16 0050)  acetaminophen (TYLENOL) solution 500 mg (500 mg Per Tube Given 08/11/16 2314)  fentaNYL (SUBLIMAZE) injection 100 mcg (100 mcg Intravenous Given 08/12/16 0033)     Initial Impression / Assessment and Plan / ED Course  I have reviewed the triage vital signs and the nursing notes.  Pertinent labs & imaging results that were available during my care of the patient were reviewed by me and  considered in my medical decision making (see chart for details).  Clinical Course    Patient was sent in for free air on CT. She's in signout you has known cancer in her oral pharynx and is worsened on CT. Also likely has lung metastases. Labs overall reassuring. CT scan of abdomen pelvis pending. Is a DO NOT RESUSCITATE and likely if there is severe pathology will go to more palliative treatment. [facility] palliative care involvement.  Final Clinical Impressions(s) / ED Diagnoses   Final diagnoses:  Metastatic cancer [facility])    New Prescriptions New Prescriptions   No medications on file     Davonna Belling, MD 08/12/16 0109

## 2016-08-11 NOTE — Telephone Encounter (Signed)
Oncology Nurse Navigator Documentation  Spoke with [name], Nurse Manager [facility], confirmed understanding of [name]'s 10/19 1400 CT appt at [facility] Radiology, her 10/20 0800 appt with Dr. Isidore Moos.  He agreed to have [name] arrive between [place] on Friday to maximize time she has with Dr. Isidore Moos.  Jordan Orem, RN, BSN, [facility] at [place] [phone]

## 2016-08-12 ENCOUNTER — Encounter (HOSPITAL_COMMUNITY): Payer: Self-pay

## 2016-08-12 ENCOUNTER — Encounter: Payer: Self-pay | Admitting: Radiation Oncology

## 2016-08-12 ENCOUNTER — Ambulatory Visit
Admission: RE | Admit: 2016-08-12 | Discharge: 2016-08-12 | Disposition: A | Discharge status: Home / Self Care | Payer: Medicare Other | Source: Ambulatory Visit | Attending: Radiation Oncology | Admitting: Radiation Oncology

## 2016-08-12 VITALS — BP 145/104 | HR 116 | Temp 97.7°F | Ht 62.0 in | Wt 138.6 lb

## 2016-08-12 DIAGNOSIS — Z882 Allergy status to sulfonamides status: Secondary | ICD-10-CM

## 2016-08-12 DIAGNOSIS — Z888 Allergy status to other drugs, medicaments and biological substances status: Secondary | ICD-10-CM | POA: Insufficient documentation

## 2016-08-12 DIAGNOSIS — Z881 Allergy status to other antibiotic agents status: Secondary | ICD-10-CM

## 2016-08-12 DIAGNOSIS — Z880 Allergy status to penicillin: Secondary | ICD-10-CM | POA: Insufficient documentation

## 2016-08-12 DIAGNOSIS — A0472 Enterocolitis due to Clostridium difficile, not specified as recurrent: Secondary | ICD-10-CM | POA: Insufficient documentation

## 2016-08-12 DIAGNOSIS — Z885 Allergy status to narcotic agent status: Secondary | ICD-10-CM | POA: Insufficient documentation

## 2016-08-12 DIAGNOSIS — C069 Malignant neoplasm of mouth, unspecified: Secondary | ICD-10-CM | POA: Diagnosis not present

## 2016-08-12 DIAGNOSIS — Z931 Gastrostomy status: Secondary | ICD-10-CM

## 2016-08-12 DIAGNOSIS — C01 Malignant neoplasm of base of tongue: Secondary | ICD-10-CM

## 2016-08-12 DIAGNOSIS — Z923 Personal history of irradiation: Secondary | ICD-10-CM | POA: Insufficient documentation

## 2016-08-12 MED ORDER — FENTANYL 25 MCG/HR TD PT72: 25.0000 ug | MEDICATED_PATCH | TRANSDERMAL | 0 refills | Status: DC

## 2016-08-12 MED ORDER — FENTANYL CITRATE (PF) 100 MCG/2ML IJ SOLN
100.0000 ug | Freq: Once | INTRAMUSCULAR | Status: AC
  Administered 2016-08-12: 100 ug via INTRAVENOUS
  Filled 2016-08-12: qty 2

## 2016-08-12 NOTE — ED Notes (Signed)
Pt transported to CT ?

## 2016-08-12 NOTE — Progress Notes (Signed)
[name] is here for follow up of radiation completed 01/12/2016 to her Base of Tongue and upper neck.  Pain issues, if any: She reports constant pain in her tongue area. She is taking Tylenol during the day, and tramadol before bedtime without much relief.  Using a feeding tube?: Yes, She is exclusively using her feeding tube. She receives continuous Jevity 24 hours daily.  Weight changes, if any:  Wt Readings from Last 3 Encounters:  08/12/16 138 lb 9.6 oz (62.9 kg)  08/11/16 138 lb (62.6 kg)  08/01/16 140 lb (63.5 kg)   Swallowing issues, if any: She does not drink anything orally. She tells me that she would drink water if her nursing facility offered it to her.  Smoking or chewing tobacco? No Using fluoride trays daily? No Last ENT visit was on: None recently Other notable issues, if any:  She has thick saliva present, she does use a portable suction machine at her nursing facility. She reports feeling like she is choking at times.  She had a CT of her Pelvis late last night related to "air" that they saw on her CT Chest performed yesterday.  BP (!) 145/104   Pulse (!) 116   Temp 97.7 F (36.5 C)   Ht 5\' 2"  (1.575 m)   Wt 138 lb 9.6 oz (62.9 kg)   SpO2 97% Comment: room air  BMI 25.35 kg/m

## 2016-08-12 NOTE — ED Provider Notes (Signed)
2:18 AM Patient signed out pending CT for eval of free air.  It is neg for free air but does reveal pneumotosis.  No signs of infection in her colon on CT evaluation however.  This may represent benign pneumatosis.  Upon my repeat evaluation she has no tenderness on exam and states she feels at her normal baseline.  They have follow up with oncology in 6hours at 8am here at [place].  Daughter states they will be requesting palliative care.  Patient appears overall well and in NAD. VS remain within her normal limits and she is safe for DC.   Everlene Balls, MD 08/12/16 [phone]

## 2016-08-15 ENCOUNTER — Non-Acute Institutional Stay (SKILLED_NURSING_FACILITY): Payer: Medicare Other | Admitting: Adult Health

## 2016-08-15 DIAGNOSIS — C01 Malignant neoplasm of base of tongue: Secondary | ICD-10-CM | POA: Diagnosis not present

## 2016-08-15 DIAGNOSIS — R197 Diarrhea, unspecified: Secondary | ICD-10-CM

## 2016-08-15 DIAGNOSIS — K909 Intestinal malabsorption, unspecified: Secondary | ICD-10-CM | POA: Diagnosis not present

## 2016-08-15 NOTE — Progress Notes (Addendum)
Location:   Customer service manager of Service:   SNF Provider:   Cindi Carbon, ANP [place] [phone]   Hollace Kinnier, DO  Patient Care Team: Gayland Curry, DO as PCP - General (Geriatric Medicine) [facility] Deneise Lever, MD as Consulting Physician (Pulmonary Disease) Jerrell Belfast, MD as Consulting Physician (Otolaryngology) Rolm Bookbinder, MD as Consulting Physician (Dermatology) [name], MD as Attending Physician (Radiation Oncology) Holley Bouche, NP as Nurse Practitioner (Nurse Practitioner)  Extended Emergency Contact Information Primary Emergency Contact: San Jetty of [place] Phone: [phone] Relation: Other Secondary Emergency Contact: Sherpa,[name]          [place], [place] Mobile Phone: [phone] Relation: Daughter  Code Status:  DNR Goals of care: Advanced Directive information Advanced Directives 08/16/2016  Does patient have an advance directive? Yes  Type of Advance Directive Out of facility DNR (pink MOST or yellow form);Living will;Healthcare Power of Attorney  Does patient want to make changes to advanced directive? -  Copy of advanced directive(s) in chart? Yes  Pre-existing out of facility DNR order (yellow form or pink MOST form) Yellow form placed in chart (order not valid for inpatient use);Pink MOST form placed in chart (order not valid for inpatient use)     Chief Complaint  Patient presents with  . Acute Visit    diarrhea, pain control    HPI:  Pt is a 82 y.o. female seen today for an acute visit for unresolved diarrhea and continued pain to her tongue area. She is alert and oriented today and participates in our conversation. She reports diarrhea without fever, nausea, vomiting or abd pain since starting tube feeding for oral ca.  We tried changing the TF from osmolite bolus feed to Jevity 1.2 continuous with no results.  She had one positive  cdiff and was treated with Flagyl but this did not help either and a second cdiff returned negative today.   A CT scan of the neck, chest, and abd on 08/11/16 suggested marked interval progression of primary tongue base tumor and bilateral cervical lymphadenopathyNumerous tiny bilateral sub-cm pulmonary nodules which are new since prior exam, and suspicious for early pulmonary metastases.  She opted to forego further treatment and hospice has been consulted.  Dr. Isidore Moos placed her on fentanyl 25 mcg.  She is reporting breakthrough pain to her tongue with a stabbing quality.  Currently she is using ultram prn for pain and scheduled at night.   Past Medical History:  Diagnosis Date  . Aortic atherosclerosis ([place]) 08/29/2013   Identified on Aortic ultrasound exam 2008   . Arthritis   . Asthma   . Bilateral claudication of lower limb ([place]) 08/29/2013   Bilateral posterior thighs  . Bullous pemphigoid 02/2015   Dr. Wilhemina Bonito  . Chronic rhinitis   . Hx of radiation therapy 12/16/15- 01/12/16   Base of Tongue and upper neck, Right Lower neck  . Hyperlipidemia   . Hypertension   . Mitral valve disorders(424.0)   . Nasal polyps   . Osteoporosis   . Unspecified hypothyroidism   . Unspecified urinary incontinence   . Urge incontinence    Past Surgical History:  Procedure Laterality Date  . BREAST LUMPECTOMY Right 1953  . BUNIONECTOMY Left 12/1998  . BUNIONECTOMY Right 02/2000  . DIRECT LARYNGOSCOPY N/A 10/30/2015   Procedure: DIRECT LARYNGOSCOPY AND BIOPSY OF TONGUE MASS;  Surgeon: Jerrell Belfast, MD;  Location: [facility];  Service: ENT;  Laterality: N/A;  . EYE SURGERY Bilateral 02/2000   Cataract removal  . milk cyst right breast    . NASAL POLYP SURGERY  03/2002   x2 Dr Wilburn Cornelia  . ROOT CANAL  02/2014  . TONSILLECTOMY    . TOTAL ABDOMINAL HYSTERECTOMY W/ BILATERAL SALPINGOOPHORECTOMY  1970's    Allergies  Allergen Reactions  . Sulfa Antibiotics   . Ampicillin Swelling  . Chlorine   .  Codeine   . Macrobid [Nitrofurantoin Monohydrate Macrocrystals]   . Rosuvastatin Calcium Other (See Comments)  . Statins   . Trimethoprim       Medication List       Accurate as of 08/15/16 11:59 PM. Always use your most recent med list.          acetaminophen 160 MG/5ML elixir Commonly known as:  TYLENOL Take 500 mg by mouth every 6 (six) hours as needed for fever.   bisacodyl 10 MG suppository Commonly known as:  DULCOLAX Place 10 mg rectally daily as needed for moderate constipation.   cholecalciferol 1000 units tablet Commonly known as:  VITAMIN D Take 2,000 Units by mouth daily.   feeding supplement (JEVITY 1.2 CAL) Liqd Place 1,000 mLs into feeding tube continuous. 60 ml/hr   glycerin adult 2 g suppository Place 1 suppository rectally as needed for constipation.   levothyroxine 75 MCG tablet Commonly known as:  SYNTHROID, LEVOTHROID TAKE 1 TABLET BY MOUTH EVERY MORNING   lidocaine 2 % solution Commonly known as:  XYLOCAINE Use as directed 10 mLs in the mouth or throat every 8 (eight) hours as needed for mouth pain.   methotrexate 2.5 MG tablet Commonly known as:  RHEUMATREX Take 7.5 mg by mouth once a week. Friday   polyethylene glycol packet Commonly known as:  MIRALAX / GLYCOLAX 17 g by PEG Tube route daily as needed for moderate constipation. Reported on 04/21/2016   predniSONE 5 MG tablet Commonly known as:  DELTASONE Take 5 mg by mouth daily with breakfast.   saccharomyces boulardii 250 MG capsule Commonly known as:  FLORASTOR Take 250 mg by mouth 2 (two) times daily.   traMADol 50 MG tablet Commonly known as:  ULTRAM Take 50 mg by mouth every 4 (four) hours as needed for moderate pain or severe pain. Not to exceed 6 tablets in 24 hours   venlafaxine 50 MG tablet Commonly known as:  EFFEXOR Take 50 mg by mouth 3 (three) times daily with meals.       Review of Systems  Constitutional: Positive for activity change and appetite change.  Negative for chills, diaphoresis, fatigue, fever and unexpected weight change.       Does not eat orally  HENT: Positive for facial swelling and trouble swallowing. Negative for congestion, postnasal drip, rhinorrhea and sore throat.   Respiratory: Negative for cough, shortness of breath and wheezing.   Cardiovascular: Negative for chest pain, palpitations and leg swelling.  Gastrointestinal: Positive for diarrhea. Negative for abdominal distention, abdominal pain, blood in stool, constipation, nausea and vomiting.  Genitourinary: Negative for difficulty urinating and dysuria.  Musculoskeletal: Positive for arthralgias and gait problem. Negative for back pain, joint swelling and myalgias.  Neurological: Negative for dizziness, tremors, seizures, syncope, facial asymmetry, speech difficulty, weakness, light-headedness, numbness and headaches.  Psychiatric/Behavioral: Negative for agitation, behavioral problems and confusion.    Immunization History  Administered Date(s) Administered  . Influenza Split 07/25/2011, 07/24/2012  . Influenza Whole 07/24/2010, 08/16/2013  . Influenza-Unspecified 08/11/2014  . Pneumococcal  Conjugate-13 11/18/2014  . Pneumococcal Polysaccharide-23 01/17/2004   Pertinent  Health Maintenance Due  Topic Date Due  . INFLUENZA VACCINE  05/24/2016  . DEXA SCAN  Completed  . PNA vac Low Risk Adult  Completed   Fall Risk  08/12/2016 04/08/2016 02/23/2016 01/25/2016 12/04/2015  Falls in the past year? Yes Yes Yes No No  Number falls in past yr: - 1 1 - -  Injury with Fall? - Yes No - -  Risk Factor Category  - High Fall Risk - - -  Risk for fall due to : - History of fall(s) History of fall(s);Impaired mobility;Impaired balance/gait - -  Follow up - Falls evaluation completed Falls evaluation completed;Education provided;Falls prevention discussed - -   Functional Status Survey:    Vitals:   08/17/16 1543  BP: (!) 141/75  Pulse: 92  Resp: 19  Temp: 98.4 F (36.9  C)  SpO2: 95%   There is no height or weight on file to calculate BMI. Physical Exam  Constitutional: She is oriented to person, place, and time. No distress.  HENT:  Edema to tongue effecting speech. Thick white saliva, white appearance to the right side of the tongue. Edema to the sub mandible area and right side of the jaw  Cardiovascular: Normal rate and regular rhythm.   Pulmonary/Chest: Effort normal and breath sounds normal.  Abdominal: Soft.  Hyperactive bowel sounds  Neurological: She is alert and oriented to person, place, and time.  Skin: Skin is warm and dry. She is not diaphoretic.  Psychiatric: She has a normal mood and affect.    Labs reviewed:  Recent Labs  12/30/15 1150  01/07/16 1037 01/11/16 1045 01/11/16 1045 04/06/16 1153 04/06/16 1153 07/04/16 1115 07/29/16 08/08/16 0944 08/11/16 2211  NA 139  < > 135* 136  --  139  --  135 129* 137 134*  K 4.1  < > 4.3 4.6  --  4.3  --  4.3 4.7 4.0 3.4*  CL 105  --   --   --   --   --   --  96*  --   --  92*  CO2 26  < > 28 30*  --  29  --   --   --   --   --   GLUCOSE 90  < > 123 165*  --  84  --  103*  --   --  81  BUN 20  < > 24.2 25.8  --  27.5*  --  30* 31* 23* 28*  CREATININE 0.91  < > 0.9 0.9  --  0.9  --  1.10* 0.8 0.7 0.80  CALCIUM 9.3  < > 9.6 9.8  --  10.3  --   --   --   --   --   MG  --   < > 1.8 2.2  --  2.2  --   --   --   --   --   PHOS  --   < > 3.1  --  3.2  --  3.6  --   --   --   --   < > = values in this interval not displayed.  Recent Labs  01/07/16 1037 01/11/16 1045 04/06/16 1153  AST 22 38* 20  ALT 24 47 17  ALKPHOS 67 88 79  BILITOT 0.38 0.32 1.07  PROT 6.4 6.6 7.5  ALBUMIN 2.9* 3.0* 3.7    Recent Labs  12/30/15 1150 07/04/16  1106  07/29/16 08/11/16 2157 08/11/16 2211  WBC 9.1 11.9*  --  10.6 9.7  --   NEUTROABS 7.4 10.3*  --   --  6.8  --   HGB 13.4 14.0  < > 11.9* 13.4 15.3*  HCT 42.2 42.7  < > 34* 40.4 45.0  MCV 99.3 98.4  --   --  98.1  --   PLT 382 394  --  456*  446*  --   < > = values in this interval not displayed. Lab Results  Component Value Date   TSH 1.609 04/06/2016   No results found for: HGBA1C Lab Results  Component Value Date   CHOL 188 06/09/2015   HDL 90 (A) 06/09/2015   LDLCALC 86 06/09/2015   TRIG 85 06/09/2015    Significant Diagnostic Results in last 30 days:  Ct Soft Tissue Neck W Contrast  Result Date: 08/11/2016 CLINICAL DATA:  Restaging base of tongue cancer. Prior radiation therapy. Generalized throat pain. EXAM: CT NECK WITH CONTRAST TECHNIQUE: Multidetector CT imaging of the neck was performed using the standard protocol following the bolus administration of intravenous contrast. CONTRAST:  61mL ISOVUE-300 IOPAMIDOL (ISOVUE-300) INJECTION 61% COMPARISON:  Maxillofacial CT 07/04/2016. PET-CT 04/06/2016. Neck CT 10/12/2015. FINDINGS: Pharynx and larynx: Previously described tongue base tumor has greatly progressed and now appears as a large, irregular, heterogeneously hyperenhancing the mass measuring at least 4.3 x 4.2 x 5.1 cm (AP x transverse x craniocaudal) with adjacent small satellite foci of enhancement. In addition to involving the tongue base, there is now extensive involvement of the oral tongue with intrinsic and extrinsic tongue muscle involvement and extension on the right greater than the left. There is extension posteriorly on the right across the glossotonsillar sulcus. Mild pharyngeal mucosal edema likely reflects radiation changes. Airway is patent. Salivary glands: Post radiation changes in the parotid and submandibular glands. Thyroid: Unchanged small size of the thyroid gland without focal abnormality identified. Lymph nodes: Marked progression of bilateral cervical lymphadenopathy compared to the prior maxillofacial CT and head CT. Most lymph nodes demonstrate varying degrees of necrosis. Submental lymph nodes measure up to 10 mm in short axis. Submandibular lymph nodes measure up to 11 mm in short axis.  Conglomerate right level II nodal mass measures 2.8 x 2.0 cm with ill-defined margins which could reflect extracapsular extension. Left-sided level II lymph nodes measure up to 2.0 x 1.4 cm. There are numerous small necrotic level II, III, and IV lymph nodes bilaterally. A large necrotic left level IV nodal mass measures 3.0 x 2.1 x 4.3 cm. Small lymph nodes or muscular implants along the right lateral margin of the hyoid measure up to 10 x 6 mm. Vascular: Major vascular structures of the neck appear patent. Lymphadenopathy results in areas of mild-to-moderate internal jugular vein compression bilaterally. Limited intracranial: Unremarkable. Visualized orbits: Prior cataract extraction. Mastoids and visualized paranasal sinuses: Prior functional sinus surgery. Chronic right sphenoid sinusitis with complete sinus opacification. Bilateral maxillary sinus osseous wall thickening from chronic sinusitis. Clear mastoids. Skeleton: No suspicious osseous lesion identified. Advanced multilevel cervical facet arthrosis. Unchanged grade 1 anterolisthesis of C2 on C3 and grade 1 retrolisthesis of C3 on C4. Upper chest: Evaluated on separate dedicated chest CT. Other: None. IMPRESSION: Marked interval progression of primary tongue base tumor and bilateral cervical lymphadenopathy as above. Electronically Signed   By: Logan Bores M.D.   On: 08/11/2016 17:09   Ct Chest W Contrast  Result Date: 08/11/2016 CLINICAL DATA:  Followup base of  tongue carcinoma. Previous radiation therapy. Followup right apical pulmonary opacity. EXAM: CT CHEST WITH CONTRAST TECHNIQUE: Multidetector CT imaging of the chest was performed during intravenous contrast administration. CONTRAST:  48mL ISOVUE-300 IOPAMIDOL (ISOVUE-300) INJECTION 61% COMPARISON:  PET-CT on 04/06/2016 FINDINGS: Cardiovascular: No acute findings. Coronary artery calcification. Aortic atherosclerosis. Mediastinum/Nodes: No masses or pathologically enlarged lymph nodes  identified within the mediastinum, hilar, or axillary regions. Lungs/Pleura: Opacity in the lateral right lung apex. cyst, but appears more angular in configuration with internal bronchograms, and is suspicious for radiation pneumonitis. Numerous tiny sub-cm pulmonary nodules are seen throughout both lungs which are new since previous study and suspicious for early pulmonary metastases. No evidence of central endobronchial obstruction or pleural effusion. Upper Abdomen: Percutaneous gastrostomy tube is again seen in place. A small amount of free air is seen within the upper abdomen which is new since previous study. This is of uncertain etiology, but is suspicious for bowel perforation in the absence of recent surgical procedure. Musculoskeletal: No suspicious bone lesions are identified, however there is a distal left clavicle fracture which appears subacute in age and is new since 04/06/2016. IMPRESSION: Numerous tiny bilateral sub-cm pulmonary nodules which are new since prior exam, and suspicious for early pulmonary metastases. Mild evolution of opacity in the right lung apex, suspicious for radiation pneumonitis. Continued attention recommended on follow-up CT. Small amount of free air within the upper abdomen which is new since previous study. This is suspicious for bowel perforation in the absence of recent surgical procedure. Recommend clinical correlation, and consider abdomen pelvis CT with oral and IV contrast for further evaluation. Subacute distal left clavicle fracture. No definite evidence of bone metastases. Critical Value/emergent results were called by telephone at the time of interpretation on 08/11/2016 at 6:31 pm to Dr. [name] , who verbally acknowledged these results. Electronically Signed   By: Earle Gell M.D.   On: 08/11/2016 18:31   Ct Abdomen Pelvis W Contrast  Result Date: 08/12/2016 CLINICAL DATA:  Question of free air within the abdomen on CT of the chest. Further evaluation  requested. Initial encounter. EXAM: CT ABDOMEN AND PELVIS WITH CONTRAST TECHNIQUE: Multidetector CT imaging of the abdomen and pelvis was performed using the standard protocol following bolus administration of intravenous contrast. CONTRAST:  80 mL of Isovue 300 IV contrast COMPARISON:  CT of the chest performed earlier today at 2:16 p.m. FINDINGS: Lower chest: Mild coronary artery calcification is noted. Scattered pleural-based nodules are noted at the lung bases bilaterally, measuring up to 6 mm in size. Hepatobiliary: The liver is unremarkable in appearance. Material of mildly increased attenuation dependently within the gallbladder likely reflects sludge. The gallbladder is otherwise unremarkable. The common bile duct remains normal in caliber. Pancreas: The pancreas is within normal limits. Spleen: The spleen is unremarkable in appearance. Adrenals/Urinary Tract: The adrenal glands are unremarkable in appearance. The kidneys are within normal limits Bilateral renal cysts are noted, larger on the left, measuring up to 6.5 cm at the lower pole of the left kidney. There is no evidence of hydronephrosis. No renal or ureteral stones are identified. No perinephric stranding is seen. Stomach/Bowel: A G-tube is noted ending at the body of the stomach. The stomach is largely decompressed. The small bowel is within normal limits. The appendix is normal in caliber, without evidence of appendicitis. Diffuse pneumatosis is noted along the ascending colon and cecum, corresponding to the finding noted on chest CT. No abnormal bowel wall thickening or soft tissue inflammation is seen, and  the underlying vasculature appears grossly intact. There is no evidence of portal venous gas. This could simply reflect benign pneumatosis, though a variety of conditions can result in pneumatosis. Vascular/Lymphatic: Scattered calcification is seen along the abdominal aorta and its branches. The abdominal aorta is otherwise grossly  unremarkable. The inferior vena cava is grossly unremarkable. No retroperitoneal lymphadenopathy is seen. No pelvic sidewall lymphadenopathy is identified. Reproductive: The bladder is mildly distended and grossly unremarkable. The patient is status post hysterectomy. No suspicious adnexal masses are seen. Other: No additional soft tissue abnormalities are seen. No free intra-abdominal air is seen. Musculoskeletal: No acute osseous abnormalities are identified. Degenerative change is noted at the left side of the pubic symphysis. Multilevel vacuum phenomenon is noted along the lumbar spine. There is grade 2 anterolisthesis of L5 on S1, reflecting chronic bilateral pars defects at L5. The visualized musculature is unremarkable in appearance. IMPRESSION: 1. Diffuse pneumatosis along the ascending colon and cecum, corresponding to the finding noted on chest CT. No abnormal bowel wall thickening or soft tissue inflammation is seen, and the underlying vasculature appears grossly intact. No evidence of portal venous gas. This could simply reflect benign pneumatosis, though a variety of conditions can result in pneumatosis. 2. No free intra-abdominal air seen. 3. Scattered pleural based nodules at the lung bases bilaterally, measuring up to 6 mm in size, as previously noted. 4. Mild coronary artery calcifications seen. 5. Sludge noted within the gallbladder. Gallbladder otherwise unremarkable. 6. Scattered bilateral renal cysts seen. 7. Scattered aortic atherosclerosis noted. 8. Grade 2 anterolisthesis of L5 on S1, reflecting chronic bilateral pars defects at L5. Degenerative change along the lumbar spine. Electronically Signed   By: Garald Balding M.D.   On: 08/12/2016 02:06    Assessment/Plan 1. Malignant neoplasm of base of tongue (HCC) Progressive per recent CT scan Pain improved with fentanyl patch but not resolved D/c scheduled ultram.  Fentanyl 50 mcg change q 3 d, Roxanol 5 mg q 4 hrs prn pain D/C Tylenol  as it is not helpful at this point and may have sorbitol in it which can cause diarrhea  2. Diarrhea due to malabsorption -most likely due to tube feeding as her second cdiff was neg -collect stool cx as well -start Florastor 1 cap p.o. BID, if no improvement consider metamucil to add fiber to the stool or further tube feeding adjustments -reduce free water flushes with meds to 30 before and 60 after  Family/ staff Communication: discussed with resident and her daughter [name]  Labs/tests ordered:  NA

## 2016-08-16 ENCOUNTER — Non-Acute Institutional Stay (SKILLED_NURSING_FACILITY): Payer: Medicare Other | Admitting: Internal Medicine

## 2016-08-16 ENCOUNTER — Encounter: Payer: Self-pay | Admitting: Internal Medicine

## 2016-08-16 DIAGNOSIS — R197 Diarrhea, unspecified: Secondary | ICD-10-CM

## 2016-08-16 DIAGNOSIS — L120 Bullous pemphigoid: Secondary | ICD-10-CM | POA: Diagnosis not present

## 2016-08-16 DIAGNOSIS — C799 Secondary malignant neoplasm of unspecified site: Secondary | ICD-10-CM

## 2016-08-16 DIAGNOSIS — R627 Adult failure to thrive: Secondary | ICD-10-CM | POA: Diagnosis not present

## 2016-08-16 DIAGNOSIS — C01 Malignant neoplasm of base of tongue: Secondary | ICD-10-CM

## 2016-08-16 NOTE — Progress Notes (Signed)
Patient ID: [name], female   DOB: 1924/07/22, 82 y.o.   MRN: [id number]  Location:  [facility] Number: 124 Place of Service:  SNF (31) Provider:  Jashay Roddy L. Mariea Clonts, D.O., C.M.D.  Hollace Kinnier, DO  Patient Care Team: Gayland Curry, DO as PCP - General (Geriatric Medicine) [facility] Deneise Lever, MD as Consulting Physician (Pulmonary Disease) Jerrell Belfast, MD as Consulting Physician (Otolaryngology) Rolm Bookbinder, MD as Consulting Physician (Dermatology) [name], MD as Attending Physician (Radiation Oncology) Holley Bouche, NP as Nurse Practitioner (Nurse Practitioner)  Extended Emergency Contact Information Primary Emergency Contact: San Jet[place] of [place] Phone: [phone] Relation: Other Secondary Emergency Contact: Tobler,[name]          [place], [place] of [place] Mobile Phone: [phone] Relation: Daughter  Code Status:  DNR Goals of care: Advanced Directive information Advanced Directives 08/16/2016  Does patient have an advance directive? Yes  Type of Advance Directive Out of facility DNR (pink MOST or yellow form);Living will;Healthcare Power of Attorney  Does patient want to make changes to advanced directive? -  Copy of advanced directive(s) in chart? Yes  Pre-existing out of facility DNR order (yellow form or pink MOST form) Yellow form placed in chart (order not valid for inpatient use);Pink MOST form placed in chart (order not valid for inpatient use)   Chief Complaint  Patient presents with  . Acute Visit    need letter     HPI:  Pt is a 82 y.o. female seen today for an acute visit for needing a letter indicating that she does have the ability to make medical and financial decisions for herself.  [name] has had tongue cancer which has now metastasized to her GI tract.  She initially had palliative radiation therapy for the mass with  improvement in its size and minimal renewed ability to swallow.  She's had terrible pain since the time of her biopsy and complications with lymphedema.  She's been receiving peg tube feedings. She's had more and more loose stool which led to the investigation about her bowels and CT scan indicating the metastatic disease.  She is clear and her daughter, [name], agrees with hospice care.  Pt's daughter, [name], had initially been her power of attorney, but she has not been in agreement with hospice and called to cancel the order/referral.  [name] is alert and oriented x 3 and understands what hospice care means and what she wants, as well as being able to decide who is best able to manage her finances.  Past Medical History:  Diagnosis Date  . Aortic atherosclerosis ([place]) 08/29/2013   Identified on Aortic ultrasound exam 2008   . Arthritis   . Asthma   . Bilateral claudication of lower limb ([place]) 08/29/2013   Bilateral posterior thighs  . Bullous pemphigoid 02/2015   Dr. Wilhemina Bonito  . Chronic rhinitis   . Hx of radiation therapy 12/16/15- 01/12/16   Base of Tongue and upper neck, Right Lower neck  . Hyperlipidemia   . Hypertension   . Mitral valve disorders(424.0)   . Nasal polyps   . Osteoporosis   . Unspecified hypothyroidism   . Unspecified urinary incontinence   . Urge incontinence    Past Surgical History:  Procedure Laterality Date  . BREAST LUMPECTOMY Right 1953  . BUNIONECTOMY Left 12/1998  . BUNIONECTOMY Right 02/2000  . DIRECT LARYNGOSCOPY N/A 10/30/2015   Procedure: DIRECT LARYNGOSCOPY AND  BIOPSY OF TONGUE MASS;  Surgeon: Jerrell Belfast, MD;  Location: [facility];  Service: ENT;  Laterality: N/A;  . EYE SURGERY Bilateral 02/2000   Cataract removal  . milk cyst right breast    . NASAL POLYP SURGERY  03/2002   x2 Dr Wilburn Cornelia  . ROOT CANAL  02/2014  . TONSILLECTOMY    . TOTAL ABDOMINAL HYSTERECTOMY W/ BILATERAL SALPINGOOPHORECTOMY  1970's    Allergies  Allergen Reactions  .  Sulfa Antibiotics   . Ampicillin Swelling  . Chlorine   . Codeine   . Macrobid [Nitrofurantoin Monohydrate Macrocrystals]   . Rosuvastatin Calcium Other (See Comments)  . Statins   . Trimethoprim       Medication List       Accurate as of 08/16/16 11:41 AM. Always use your most recent med list.          acetaminophen 160 MG/5ML elixir Commonly known as:  TYLENOL Take 500 mg by mouth every 6 (six) hours as needed for fever.   bisacodyl 10 MG suppository Commonly known as:  DULCOLAX Place 10 mg rectally daily as needed for moderate constipation.   cholecalciferol 1000 units tablet Commonly known as:  VITAMIN D Take 2,000 Units by mouth daily.   feeding supplement (JEVITY 1.2 CAL) Liqd Place 1,000 mLs into feeding tube continuous. 60 ml/hr   fentaNYL 50 MCG/HR Commonly known as:  DURAGESIC - dosed mcg/hr Place 50 mcg onto the skin every 3 (three) days.   glycerin adult 2 g suppository Place 1 suppository rectally as needed for constipation.   levothyroxine 75 MCG tablet Commonly known as:  SYNTHROID, LEVOTHROID TAKE 1 TABLET BY MOUTH EVERY MORNING   lidocaine 2 % solution Commonly known as:  XYLOCAINE Use as directed 10 mLs in the mouth or throat every 8 (eight) hours as needed for mouth pain.   methotrexate 2.5 MG tablet Commonly known as:  RHEUMATREX Take 7.5 mg by mouth once a week. Friday   polyethylene glycol packet Commonly known as:  MIRALAX / GLYCOLAX 17 g by PEG Tube route daily as needed for moderate constipation. Reported on 04/21/2016   predniSONE 5 MG tablet Commonly known as:  DELTASONE Take 5 mg by mouth daily with breakfast.   saccharomyces boulardii 250 MG capsule Commonly known as:  FLORASTOR Take 250 mg by mouth 2 (two) times daily.   traMADol 50 MG tablet Commonly known as:  ULTRAM Take 50 mg by mouth every 4 (four) hours as needed for moderate pain or severe pain. Not to exceed 6 tablets in 24 hours   venlafaxine 50 MG  tablet Commonly known as:  EFFEXOR Take 50 mg by mouth 3 (three) times daily with meals.       Review of Systems  Constitutional: Positive for activity change, appetite change, fatigue and unexpected weight change.  HENT: Positive for sore throat and trouble swallowing. Negative for congestion.   Respiratory: Negative for shortness of breath.   Cardiovascular: Negative for chest pain and leg swelling.  Gastrointestinal: Positive for abdominal distention, abdominal pain, diarrhea and nausea. Negative for anal bleeding, blood in stool, constipation, rectal pain and vomiting.  Genitourinary: Negative for dysuria.  Musculoskeletal: Negative for arthralgias.  Skin: Negative for rash.  Neurological: Positive for weakness. Negative for dizziness.  Hematological: Positive for adenopathy.  Psychiatric/Behavioral: Negative for agitation and confusion.    Immunization History  Administered Date(s) Administered  . Influenza Split 07/25/2011, 07/24/2012  . Influenza Whole 07/24/2010, 08/16/2013  . Influenza-Unspecified 08/11/2014  .  Pneumococcal Conjugate-13 11/18/2014  . Pneumococcal Polysaccharide-23 01/17/2004   Pertinent  Health Maintenance Due  Topic Date Due  . INFLUENZA VACCINE  05/24/2016  . DEXA SCAN  Completed  . PNA vac Low Risk Adult  Completed   Fall Risk  08/12/2016 04/08/2016 02/23/2016 01/25/2016 12/04/2015  Falls in the past year? Yes Yes Yes No No  Number falls in past yr: - 1 1 - -  Injury with Fall? - Yes No - -  Risk Factor Category  - High Fall Risk - - -  Risk for fall due to : - History of fall(s) History of fall(s);Impaired mobility;Impaired balance/gait - -  Follow up - Falls evaluation completed Falls evaluation completed;Education provided;Falls prevention discussed - -   Functional Status Survey:    Vitals:   08/16/16 1117  BP: (!) 158/83  Pulse: 92  Resp: 18  Temp: (!) 96.6 F (35.9 C)  TempSrc: Oral  SpO2: 94%  Weight: 140 lb (63.5 kg)   Body mass  index is 25.61 kg/m. Physical Exam  Constitutional: She is oriented to person, place, and time. No distress.  Neck:  Right neck swelling  Cardiovascular: Normal rate, regular rhythm, normal heart sounds and intact distal pulses.   Pulmonary/Chest: Effort normal and breath sounds normal. No respiratory distress.  Abdominal: Soft. Bowel sounds are normal. She exhibits distension. There is no tenderness.  Musculoskeletal: Normal range of motion.  Lymphadenopathy:    She has cervical adenopathy.  Neurological: She is alert and oriented to person, place, and time. No cranial nerve deficit.  Skin: Skin is warm and dry.    Labs reviewed:  Recent Labs  12/30/15 1150  01/07/16 1037 01/11/16 1045 01/11/16 1045 04/06/16 1153 04/06/16 1153 07/04/16 1115 07/29/16 08/08/16 0944 08/11/16 2211  NA 139  < > 135* 136  --  139  --  135 129* 137 134*  K 4.1  < > 4.3 4.6  --  4.3  --  4.3 4.7 4.0 3.4*  CL 105  --   --   --   --   --   --  96*  --   --  92*  CO2 26  < > 28 30*  --  29  --   --   --   --   --   GLUCOSE 90  < > 123 165*  --  84  --  103*  --   --  81  BUN 20  < > 24.2 25.8  --  27.5*  --  30* 31* 23* 28*  CREATININE 0.91  < > 0.9 0.9  --  0.9  --  1.10* 0.8 0.7 0.80  CALCIUM 9.3  < > 9.6 9.8  --  10.3  --   --   --   --   --   MG  --   < > 1.8 2.2  --  2.2  --   --   --   --   --   PHOS  --   < > 3.1  --  3.2  --  3.6  --   --   --   --   < > = values in this interval not displayed.  Recent Labs  01/07/16 1037 01/11/16 1045 04/06/16 1153  AST 22 38* 20  ALT 24 47 17  ALKPHOS 67 88 79  BILITOT 0.38 0.32 1.07  PROT 6.4 6.6 7.5  ALBUMIN 2.9* 3.0* 3.7    Recent Labs  12/30/15  1150 07/04/16 1106  07/29/16 08/11/16 2157 08/11/16 2211  WBC 9.1 11.9*  --  10.6 9.7  --   NEUTROABS 7.4 10.3*  --   --  6.8  --   HGB 13.4 14.0  < > 11.9* 13.4 15.3*  HCT 42.2 42.7  < > 34* 40.4 45.0  MCV 99.3 98.4  --   --  98.1  --   PLT 382 394  --  456* 446*  --   < > = values in this  interval not displayed. Lab Results  Component Value Date   TSH 1.609 04/06/2016   No results found for: HGBA1C Lab Results  Component Value Date   CHOL 188 06/09/2015   HDL 90 (A) 06/09/2015   LDLCALC 86 06/09/2015   TRIG 85 06/09/2015    Significant Diagnostic Results in last 30 days:  Ct Soft Tissue Neck W Contrast  Result Date: 08/11/2016 CLINICAL DATA:  Restaging base of tongue cancer. Prior radiation therapy. Generalized throat pain. EXAM: CT NECK WITH CONTRAST TECHNIQUE: Multidetector CT imaging of the neck was performed using the standard protocol following the bolus administration of intravenous contrast. CONTRAST:  37mL ISOVUE-300 IOPAMIDOL (ISOVUE-300) INJECTION 61% COMPARISON:  Maxillofacial CT 07/04/2016. PET-CT 04/06/2016. Neck CT 10/12/2015. FINDINGS: Pharynx and larynx: Previously described tongue base tumor has greatly progressed and now appears as a large, irregular, heterogeneously hyperenhancing the mass measuring at least 4.3 x 4.2 x 5.1 cm (AP x transverse x craniocaudal) with adjacent small satellite foci of enhancement. In addition to involving the tongue base, there is now extensive involvement of the oral tongue with intrinsic and extrinsic tongue muscle involvement and extension on the right greater than the left. There is extension posteriorly on the right across the glossotonsillar sulcus. Mild pharyngeal mucosal edema likely reflects radiation changes. Airway is patent. Salivary glands: Post radiation changes in the parotid and submandibular glands. Thyroid: Unchanged small size of the thyroid gland without focal abnormality identified. Lymph nodes: Marked progression of bilateral cervical lymphadenopathy compared to the prior maxillofacial CT and head CT. Most lymph nodes demonstrate varying degrees of necrosis. Submental lymph nodes measure up to 10 mm in short axis. Submandibular lymph nodes measure up to 11 mm in short axis. Conglomerate right level II nodal mass  measures 2.8 x 2.0 cm with ill-defined margins which could reflect extracapsular extension. Left-sided level II lymph nodes measure up to 2.0 x 1.4 cm. There are numerous small necrotic level II, III, and IV lymph nodes bilaterally. A large necrotic left level IV nodal mass measures 3.0 x 2.1 x 4.3 cm. Small lymph nodes or muscular implants along the right lateral margin of the hyoid measure up to 10 x 6 mm. Vascular: Major vascular structures of the neck appear patent. Lymphadenopathy results in areas of mild-to-moderate internal jugular vein compression bilaterally. Limited intracranial: Unremarkable. Visualized orbits: Prior cataract extraction. Mastoids and visualized paranasal sinuses: Prior functional sinus surgery. Chronic right sphenoid sinusitis with complete sinus opacification. Bilateral maxillary sinus osseous wall thickening from chronic sinusitis. Clear mastoids. Skeleton: No suspicious osseous lesion identified. Advanced multilevel cervical facet arthrosis. Unchanged grade 1 anterolisthesis of C2 on C3 and grade 1 retrolisthesis of C3 on C4. Upper chest: Evaluated on separate dedicated chest CT. Other: None. IMPRESSION: Marked interval progression of primary tongue base tumor and bilateral cervical lymphadenopathy as above. Electronically Signed   By: Logan Bores M.D.   On: 08/11/2016 17:09   Ct Chest W Contrast  Result Date: 08/11/2016 CLINICAL DATA:  Followup  base of tongue carcinoma. Previous radiation therapy. Followup right apical pulmonary opacity. EXAM: CT CHEST WITH CONTRAST TECHNIQUE: Multidetector CT imaging of the chest was performed during intravenous contrast administration. CONTRAST:  17mL ISOVUE-300 IOPAMIDOL (ISOVUE-300) INJECTION 61% COMPARISON:  PET-CT on 04/06/2016 FINDINGS: Cardiovascular: No acute findings. Coronary artery calcification. Aortic atherosclerosis. Mediastinum/Nodes: No masses or pathologically enlarged lymph nodes identified within the mediastinum, hilar, or  axillary regions. Lungs/Pleura: Opacity in the lateral right lung apex. cyst, but appears more angular in configuration with internal bronchograms, and is suspicious for radiation pneumonitis. Numerous tiny sub-cm pulmonary nodules are seen throughout both lungs which are new since previous study and suspicious for early pulmonary metastases. No evidence of central endobronchial obstruction or pleural effusion. Upper Abdomen: Percutaneous gastrostomy tube is again seen in place. A small amount of free air is seen within the upper abdomen which is new since previous study. This is of uncertain etiology, but is suspicious for bowel perforation in the absence of recent surgical procedure. Musculoskeletal: No suspicious bone lesions are identified, however there is a distal left clavicle fracture which appears subacute in age and is new since 04/06/2016. IMPRESSION: Numerous tiny bilateral sub-cm pulmonary nodules which are new since prior exam, and suspicious for early pulmonary metastases. Mild evolution of opacity in the right lung apex, suspicious for radiation pneumonitis. Continued attention recommended on follow-up CT. Small amount of free air within the upper abdomen which is new since previous study. This is suspicious for bowel perforation in the absence of recent surgical procedure. Recommend clinical correlation, and consider abdomen pelvis CT with oral and IV contrast for further evaluation. Subacute distal left clavicle fracture. No definite evidence of bone metastases. Critical Value/emergent results were called by telephone at the time of interpretation on 08/11/2016 at 6:31 pm to Dr. [name] , who verbally acknowledged these results. Electronically Signed   By: Earle Gell M.D.   On: 08/11/2016 18:31   Ct Abdomen Pelvis W Contrast  Result Date: 08/12/2016 CLINICAL DATA:  Question of free air within the abdomen on CT of the chest. Further evaluation requested. Initial encounter. EXAM: CT  ABDOMEN AND PELVIS WITH CONTRAST TECHNIQUE: Multidetector CT imaging of the abdomen and pelvis was performed using the standard protocol following bolus administration of intravenous contrast. CONTRAST:  80 mL of Isovue 300 IV contrast COMPARISON:  CT of the chest performed earlier today at 2:16 p.m. FINDINGS: Lower chest: Mild coronary artery calcification is noted. Scattered pleural-based nodules are noted at the lung bases bilaterally, measuring up to 6 mm in size. Hepatobiliary: The liver is unremarkable in appearance. Material of mildly increased attenuation dependently within the gallbladder likely reflects sludge. The gallbladder is otherwise unremarkable. The common bile duct remains normal in caliber. Pancreas: The pancreas is within normal limits. Spleen: The spleen is unremarkable in appearance. Adrenals/Urinary Tract: The adrenal glands are unremarkable in appearance. The kidneys are within normal limits Bilateral renal cysts are noted, larger on the left, measuring up to 6.5 cm at the lower pole of the left kidney. There is no evidence of hydronephrosis. No renal or ureteral stones are identified. No perinephric stranding is seen. Stomach/Bowel: A G-tube is noted ending at the body of the stomach. The stomach is largely decompressed. The small bowel is within normal limits. The appendix is normal in caliber, without evidence of appendicitis. Diffuse pneumatosis is noted along the ascending colon and cecum, corresponding to the finding noted on chest CT. No abnormal bowel wall thickening or soft tissue inflammation is  seen, and the underlying vasculature appears grossly intact. There is no evidence of portal venous gas. This could simply reflect benign pneumatosis, though a variety of conditions can result in pneumatosis. Vascular/Lymphatic: Scattered calcification is seen along the abdominal aorta and its branches. The abdominal aorta is otherwise grossly unremarkable. The inferior vena cava is grossly  unremarkable. No retroperitoneal lymphadenopathy is seen. No pelvic sidewall lymphadenopathy is identified. Reproductive: The bladder is mildly distended and grossly unremarkable. The patient is status post hysterectomy. No suspicious adnexal masses are seen. Other: No additional soft tissue abnormalities are seen. No free intra-abdominal air is seen. Musculoskeletal: No acute osseous abnormalities are identified. Degenerative change is noted at the left side of the pubic symphysis. Multilevel vacuum phenomenon is noted along the lumbar spine. There is grade 2 anterolisthesis of L5 on S1, reflecting chronic bilateral pars defects at L5. The visualized musculature is unremarkable in appearance. IMPRESSION: 1. Diffuse pneumatosis along the ascending colon and cecum, corresponding to the finding noted on chest CT. No abnormal bowel wall thickening or soft tissue inflammation is seen, and the underlying vasculature appears grossly intact. No evidence of portal venous gas. This could simply reflect benign pneumatosis, though a variety of conditions can result in pneumatosis. 2. No free intra-abdominal air seen. 3. Scattered pleural based nodules at the lung bases bilaterally, measuring up to 6 mm in size, as previously noted. 4. Mild coronary artery calcifications seen. 5. Sludge noted within the gallbladder. Gallbladder otherwise unremarkable. 6. Scattered bilateral renal cysts seen. 7. Scattered aortic atherosclerosis noted. 8. Grade 2 anterolisthesis of L5 on S1, reflecting chronic bilateral pars defects at L5. Degenerative change along the lumbar spine. Electronically Signed   By: Garald Balding M.D.   On: 08/12/2016 02:06    Assessment/Plan 1. Malignant neoplasm of base of tongue ([place]) -pt and the majority of her family members agree with hospice care -she is capable of determining who should help to manage her finances and health care at this time and able to make this decision--she is aware of the benefits,  purpose, and understands the trajectory of her disease  2. Metastatic disease ([place]) -to bowels with recent increasing diarrhea not altered by changes in her tube feedings -noted on CT  3. Bullous pemphigoid -has been on methotrexate and some steroid txs for this and it had improved dramatically  4. Diarrhea, unspecified type -see #2  5. Adult failure to thrive syndrome -due to her tongue cancer with mets to lymph nodes, bowels -hospice care recommended and pt and most family in agreement -letter written indicating that [name] is able to decide about her finances and health care choices with last MMSe of 27/30 on 07/21/16  Family/ staff Communication: discussed with nursing staff  Labs/tests ordered:  hospice

## 2016-08-17 ENCOUNTER — Encounter: Payer: Self-pay | Admitting: *Deleted

## 2016-08-17 ENCOUNTER — Encounter: Payer: Self-pay | Admitting: Internal Medicine

## 2016-08-17 ENCOUNTER — Encounter: Payer: Self-pay | Admitting: Adult Health

## 2016-08-17 DIAGNOSIS — R197 Diarrhea, unspecified: Secondary | ICD-10-CM | POA: Insufficient documentation

## 2016-08-22 ENCOUNTER — Non-Acute Institutional Stay (SKILLED_NURSING_FACILITY): Payer: Medicare Other | Admitting: Adult Health

## 2016-08-22 DIAGNOSIS — R059 Cough, unspecified: Secondary | ICD-10-CM

## 2016-08-22 DIAGNOSIS — R197 Diarrhea, unspecified: Secondary | ICD-10-CM

## 2016-08-22 DIAGNOSIS — R05 Cough: Secondary | ICD-10-CM | POA: Diagnosis not present

## 2016-08-22 DIAGNOSIS — C01 Malignant neoplasm of base of tongue: Secondary | ICD-10-CM | POA: Diagnosis not present

## 2016-08-22 DIAGNOSIS — K909 Intestinal malabsorption, unspecified: Secondary | ICD-10-CM

## 2016-08-22 DIAGNOSIS — Z7189 Other specified counseling: Secondary | ICD-10-CM | POA: Diagnosis not present

## 2016-08-24 ENCOUNTER — Encounter: Payer: Self-pay | Admitting: Adult Health

## 2016-08-24 NOTE — Progress Notes (Signed)
Location:  [facility] [place] of Service:  SNF (31) Provider:   Cindi Carbon, ANP [place] [phone]   REED, Jonelle Sidle, DO  Patient Care Team: Gayland Curry, DO as PCP - General (Geriatric Medicine) [facility] Deneise Lever, MD as Consulting Physician (Pulmonary Disease) Jerrell Belfast, MD as Consulting Physician (Otolaryngology) Rolm Bookbinder, MD as Consulting Physician (Dermatology) [name], MD as Attending Physician (Radiation Oncology) Holley Bouche, NP as Nurse Practitioner (Nurse Practitioner)  Extended Emergency Contact Information Primary Emergency Contact: [place] of [place] Phone: [phone] Relation: Other Secondary Emergency Contact: Peale,[name] Address: [address], [place] [address] [place] Phone: [phone] Mobile Phone: [phone] Relation: Daughter  Code Status:  DNR Goals of care: Advanced Directive information Advanced Directives 08/16/2016  Does patient have an advance directive? Yes  Type of Advance Directive Out of facility DNR (pink MOST or yellow form);Living will;Healthcare Power of Attorney  Does patient want to make changes to advanced directive? -  Copy of advanced directive(s) in chart? Yes  Pre-existing out of facility DNR order (yellow form or pink MOST form) Yellow form placed in chart (order not valid for inpatient use);Pink MOST form placed in chart (order not valid for inpatient use)     Chief Complaint  Patient presents with  . Acute Visit    congestion    HPI:  Pt is a 82 y.o. female seen today for an acute visit lung congestion and a "rattle in her throat".  She has a hx of oral ca with the following most recent CT scan: A CT scan of the neck, chest, and abd on 08/11/16 suggested marked interval progression of primary tongue base tumor and bilateral cervical lymphadenopathyNumerous tiny  bilateral sub-cm pulmonary nodules which are new since prior exam, and suspicious for early pulmonary metastases.   She opted to forego further treatment and hospice has been consulted.   She is currently on fentanyl 37 mcg as the 50 seemed to make her too sedated. Her face is more a swollen and she is having some nausea with the tube feedings. No residuals, fever, etc.  She has a cough that is mild but mostly has sputum in the back of her throat making a sound when she breaths.  She has become more lethargic.  Her daughter [name] feels that she is "giving up".  Symptoms of diarrhea have resolved when fentanyl dose was increased to 37 mcg and florastor was added.   Past Medical History:  Diagnosis Date  . Aortic atherosclerosis ([place]) 08/29/2013   Identified on Aortic ultrasound exam 2008   . Arthritis   . Asthma   . Bilateral claudication of lower limb ([place]) 08/29/2013   Bilateral posterior thighs  . Bullous pemphigoid 02/2015   Dr. Wilhemina Bonito  . Chronic rhinitis   . Hx of radiation therapy 12/16/15- 01/12/16   Base of Tongue and upper neck, Right Lower neck  . Hyperlipidemia   . Hypertension   . Mitral valve disorders(424.0)   . Nasal polyps   . Osteoporosis   . Unspecified hypothyroidism   . Unspecified urinary incontinence   . Urge incontinence    Past Surgical History:  Procedure Laterality Date  . BREAST LUMPECTOMY Right 1953  . BUNIONECTOMY Left 12/1998  . BUNIONECTOMY Right 02/2000  . DIRECT LARYNGOSCOPY N/A 10/30/2015   Procedure: DIRECT LARYNGOSCOPY AND BIOPSY OF TONGUE MASS;  Surgeon: Jerrell Belfast, MD;  Location: [place];  Service: ENT;  Laterality: N/A;  . EYE SURGERY Bilateral 02/2000   Cataract removal  . milk cyst right breast    . NASAL POLYP SURGERY  03/2002   x2 Dr Wilburn Cornelia  . ROOT CANAL  02/2014  . TONSILLECTOMY    . TOTAL ABDOMINAL HYSTERECTOMY W/ BILATERAL SALPINGOOPHORECTOMY  1970's    Allergies  Allergen Reactions  . Sulfa Antibiotics   . Ampicillin Swelling    . Chlorine   . Codeine   . Macrobid [Nitrofurantoin Monohydrate Macrocrystals]   . Rosuvastatin Calcium Other (See Comments)  . Statins   . Trimethoprim       Medication List       Accurate as of 08/22/16 11:59 PM. Always use your most recent med list.          acetaminophen 160 MG/5ML elixir Commonly known as:  TYLENOL Take 500 mg by mouth every 6 (six) hours as needed for fever.   bisacodyl 10 MG suppository Commonly known as:  DULCOLAX Place 10 mg rectally daily as needed for moderate constipation.   cholecalciferol 1000 units tablet Commonly known as:  VITAMIN D Take 2,000 Units by mouth daily.   feeding supplement (JEVITY 1.2 CAL) Liqd Place 1,000 mLs into feeding tube continuous. 60 ml/hr   fentaNYL 50 MCG/HR Commonly known as:  DURAGESIC - dosed mcg/hr Place 50 mcg onto the skin every 3 (three) days.   glycerin adult 2 g suppository Place 1 suppository rectally as needed for constipation.   levothyroxine 75 MCG tablet Commonly known as:  SYNTHROID, LEVOTHROID TAKE 1 TABLET BY MOUTH EVERY MORNING   lidocaine 2 % solution Commonly known as:  XYLOCAINE Use as directed 10 mLs in the mouth or throat every 8 (eight) hours as needed for mouth pain.   methotrexate 2.5 MG tablet Commonly known as:  RHEUMATREX Take 7.5 mg by mouth once a week. Friday   polyethylene glycol packet Commonly known as:  MIRALAX / GLYCOLAX 17 g by PEG Tube route daily as needed for moderate constipation. Reported on 04/21/2016   predniSONE 5 MG tablet Commonly known as:  DELTASONE Take 5 mg by mouth daily with breakfast.   saccharomyces boulardii 250 MG capsule Commonly known as:  FLORASTOR Take 250 mg by mouth 2 (two) times daily.   traMADol 50 MG tablet Commonly known as:  ULTRAM Take 50 mg by mouth every 4 (four) hours as needed for moderate pain or severe pain. Not to exceed 6 tablets in 24 hours   venlafaxine 50 MG tablet Commonly known as:  EFFEXOR Take 50 mg by  mouth 3 (three) times daily with meals.       Review of Systems  Constitutional: Positive for activity change and appetite change. Negative for chills, diaphoresis, fatigue, fever and unexpected weight change.       Does not eat orally  HENT: Positive for drooling, facial swelling and trouble swallowing. Negative for congestion, postnasal drip, rhinorrhea and sore throat.   Respiratory: Positive for cough. Negative for shortness of breath and wheezing.        Congestion   Cardiovascular: Negative for chest pain, palpitations and leg swelling.  Gastrointestinal: Negative for abdominal distention, abdominal pain, blood in stool, constipation, diarrhea, nausea and vomiting.  Genitourinary: Negative for difficulty urinating and dysuria.  Musculoskeletal: Positive for arthralgias and gait problem. Negative for back pain, joint swelling and myalgias.  Neurological: Positive for speech difficulty. Negative for dizziness, tremors, seizures, syncope,  facial asymmetry, weakness, light-headedness, numbness and headaches.  Psychiatric/Behavioral: Negative for agitation, behavioral problems and confusion.       Lethargy    Immunization History  Administered Date(s) Administered  . Influenza Split 07/25/2011, 07/24/2012  . Influenza Whole 07/24/2010, 08/16/2013  . Influenza-Unspecified 08/11/2014  . Pneumococcal Conjugate-13 11/18/2014  . Pneumococcal Polysaccharide-23 01/17/2004   Pertinent  Health Maintenance Due  Topic Date Due  . INFLUENZA VACCINE  05/24/2016  . DEXA SCAN  Completed  . PNA vac Low Risk Adult  Completed   Fall Risk  08/12/2016 04/08/2016 02/23/2016 01/25/2016 12/04/2015  Falls in the past year? Yes Yes Yes No No  Number falls in past yr: - 1 1 - -  Injury with Fall? - Yes No - -  Risk Factor Category  - High Fall Risk - - -  Risk for fall due to : - History of fall(s) History of fall(s);Impaired mobility;Impaired balance/gait - -  Follow up - Falls evaluation completed Falls  evaluation completed;Education provided;Falls prevention discussed - -   Functional Status Survey:    Vitals:   08/24/16 1551  Pulse: 87  Resp: (!) 22  Temp: 97.8 F (36.6 C)  SpO2: 94%   There is no height or weight on file to calculate BMI. Physical Exam  Constitutional: No distress.  HENT:  Worsening edema to the right side of her face and chin.  Refused oral exam  Eyes: Pupils are equal, round, and reactive to light.  Cardiovascular: Normal rate and regular rhythm.   Pulmonary/Chest: Effort normal. No respiratory distress.  Rhonchi throughout  Abdominal: Soft. Bowel sounds are normal.  Neurological:  Lethargic, not intermittently able to follow commands, responds to verbal stimuli  Skin: Skin is warm and dry. She is not diaphoretic.    Labs reviewed:  Recent Labs  12/30/15 1150  01/07/16 1037 01/11/16 1045 01/11/16 1045 04/06/16 1153 04/06/16 1153 07/04/16 1115 07/29/16 08/08/16 0944 08/11/16 2211  NA 139  < > 135* 136  --  139  --  135 129* 137 134*  K 4.1  < > 4.3 4.6  --  4.3  --  4.3 4.7 4.0 3.4*  CL 105  --   --   --   --   --   --  96*  --   --  92*  CO2 26  < > 28 30*  --  29  --   --   --   --   --   GLUCOSE 90  < > 123 165*  --  84  --  103*  --   --  81  BUN 20  < > 24.2 25.8  --  27.5*  --  30* 31* 23* 28*  CREATININE 0.91  < > 0.9 0.9  --  0.9  --  1.10* 0.8 0.7 0.80  CALCIUM 9.3  < > 9.6 9.8  --  10.3  --   --   --   --   --   MG  --   < > 1.8 2.2  --  2.2  --   --   --   --   --   PHOS  --   < > 3.1  --  3.2  --  3.6  --   --   --   --   < > = values in this interval not displayed.  Recent Labs  01/07/16 1037 01/11/16 1045 04/06/16 1153  AST 22 38* 20  ALT 24  47 17  ALKPHOS 67 88 79  BILITOT 0.38 0.32 1.07  PROT 6.4 6.6 7.5  ALBUMIN 2.9* 3.0* 3.7    Recent Labs  12/30/15 1150 07/04/16 1106  07/29/16 08/11/16 2157 08/11/16 2211  WBC 9.1 11.9*  --  10.6 9.7  --   NEUTROABS 7.4 10.3*  --   --  6.8  --   HGB 13.4 14.0  < > 11.9*  13.4 15.3*  HCT 42.2 42.7  < > 34* 40.4 45.0  MCV 99.3 98.4  --   --  98.1  --   PLT 382 394  --  456* 446*  --   < > = values in this interval not displayed. Lab Results  Component Value Date   TSH 1.609 04/06/2016   No results found for: HGBA1C Lab Results  Component Value Date   CHOL 188 06/09/2015   HDL 90 (A) 06/09/2015   LDLCALC 86 06/09/2015   TRIG 85 06/09/2015    Significant Diagnostic Results in last 30 days:  Ct Soft Tissue Neck W Contrast  Result Date: 08/11/2016 CLINICAL DATA:  Restaging base of tongue cancer. Prior radiation therapy. Generalized throat pain. EXAM: CT NECK WITH CONTRAST TECHNIQUE: Multidetector CT imaging of the neck was performed using the standard protocol following the bolus administration of intravenous contrast. CONTRAST:  56mL ISOVUE-300 IOPAMIDOL (ISOVUE-300) INJECTION 61% COMPARISON:  Maxillofacial CT 07/04/2016. PET-CT 04/06/2016. Neck CT 10/12/2015. FINDINGS: Pharynx and larynx: Previously described tongue base tumor has greatly progressed and now appears as a large, irregular, heterogeneously hyperenhancing the mass measuring at least 4.3 x 4.2 x 5.1 cm (AP x transverse x craniocaudal) with adjacent small satellite foci of enhancement. In addition to involving the tongue base, there is now extensive involvement of the oral tongue with intrinsic and extrinsic tongue muscle involvement and extension on the right greater than the left. There is extension posteriorly on the right across the glossotonsillar sulcus. Mild pharyngeal mucosal edema likely reflects radiation changes. Airway is patent. Salivary glands: Post radiation changes in the parotid and submandibular glands. Thyroid: Unchanged small size of the thyroid gland without focal abnormality identified. Lymph nodes: Marked progression of bilateral cervical lymphadenopathy compared to the prior maxillofacial CT and head CT. Most lymph nodes demonstrate varying degrees of necrosis. Submental lymph  nodes measure up to 10 mm in short axis. Submandibular lymph nodes measure up to 11 mm in short axis. Conglomerate right level II nodal mass measures 2.8 x 2.0 cm with ill-defined margins which could reflect extracapsular extension. Left-sided level II lymph nodes measure up to 2.0 x 1.4 cm. There are numerous small necrotic level II, III, and IV lymph nodes bilaterally. A large necrotic left level IV nodal mass measures 3.0 x 2.1 x 4.3 cm. Small lymph nodes or muscular implants along the right lateral margin of the hyoid measure up to 10 x 6 mm. Vascular: Major vascular structures of the neck appear patent. Lymphadenopathy results in areas of mild-to-moderate internal jugular vein compression bilaterally. Limited intracranial: Unremarkable. Visualized orbits: Prior cataract extraction. Mastoids and visualized paranasal sinuses: Prior functional sinus surgery. Chronic right sphenoid sinusitis with complete sinus opacification. Bilateral maxillary sinus osseous wall thickening from chronic sinusitis. Clear mastoids. Skeleton: No suspicious osseous lesion identified. Advanced multilevel cervical facet arthrosis. Unchanged grade 1 anterolisthesis of C2 on C3 and grade 1 retrolisthesis of C3 on C4. Upper chest: Evaluated on separate dedicated chest CT. Other: None. IMPRESSION: Marked interval progression of primary tongue base tumor and bilateral cervical lymphadenopathy as  above. Electronically Signed   By: Logan Bores M.D.   On: 08/11/2016 17:09   Ct Chest W Contrast  Result Date: 08/11/2016 CLINICAL DATA:  Followup base of tongue carcinoma. Previous radiation therapy. Followup right apical pulmonary opacity. EXAM: CT CHEST WITH CONTRAST TECHNIQUE: Multidetector CT imaging of the chest was performed during intravenous contrast administration. CONTRAST:  45mL ISOVUE-300 IOPAMIDOL (ISOVUE-300) INJECTION 61% COMPARISON:  PET-CT on 04/06/2016 FINDINGS: Cardiovascular: No acute findings. Coronary artery  calcification. Aortic atherosclerosis. Mediastinum/Nodes: No masses or pathologically enlarged lymph nodes identified within the mediastinum, hilar, or axillary regions. Lungs/Pleura: Opacity in the lateral right lung apex. cyst, but appears more angular in configuration with internal bronchograms, and is suspicious for radiation pneumonitis. Numerous tiny sub-cm pulmonary nodules are seen throughout both lungs which are new since previous study and suspicious for early pulmonary metastases. No evidence of central endobronchial obstruction or pleural effusion. Upper Abdomen: Percutaneous gastrostomy tube is again seen in place. A small amount of free air is seen within the upper abdomen which is new since previous study. This is of uncertain etiology, but is suspicious for bowel perforation in the absence of recent surgical procedure. Musculoskeletal: No suspicious bone lesions are identified, however there is a distal left clavicle fracture which appears subacute in age and is new since 04/06/2016. IMPRESSION: Numerous tiny bilateral sub-cm pulmonary nodules which are new since prior exam, and suspicious for early pulmonary metastases. Mild evolution of opacity in the right lung apex, suspicious for radiation pneumonitis. Continued attention recommended on follow-up CT. Small amount of free air within the upper abdomen which is new since previous study. This is suspicious for bowel perforation in the absence of recent surgical procedure. Recommend clinical correlation, and consider abdomen pelvis CT with oral and IV contrast for further evaluation. Subacute distal left clavicle fracture. No definite evidence of bone metastases. Critical Value/emergent results were called by telephone at the time of interpretation on 08/11/2016 at 6:31 pm to Dr. [name] , who verbally acknowledged these results. Electronically Signed   By: Earle Gell M.D.   On: 08/11/2016 18:31   Ct Abdomen Pelvis W Contrast  Result Date:  08/12/2016 CLINICAL DATA:  Question of free air within the abdomen on CT of the chest. Further evaluation requested. Initial encounter. EXAM: CT ABDOMEN AND PELVIS WITH CONTRAST TECHNIQUE: Multidetector CT imaging of the abdomen and pelvis was performed using the standard protocol following bolus administration of intravenous contrast. CONTRAST:  80 mL of Isovue 300 IV contrast COMPARISON:  CT of the chest performed earlier today at 2:16 p.m. FINDINGS: Lower chest: Mild coronary artery calcification is noted. Scattered pleural-based nodules are noted at the lung bases bilaterally, measuring up to 6 mm in size. Hepatobiliary: The liver is unremarkable in appearance. Material of mildly increased attenuation dependently within the gallbladder likely reflects sludge. The gallbladder is otherwise unremarkable. The common bile duct remains normal in caliber. Pancreas: The pancreas is within normal limits. Spleen: The spleen is unremarkable in appearance. Adrenals/Urinary Tract: The adrenal glands are unremarkable in appearance. The kidneys are within normal limits Bilateral renal cysts are noted, larger on the left, measuring up to 6.5 cm at the lower pole of the left kidney. There is no evidence of hydronephrosis. No renal or ureteral stones are identified. No perinephric stranding is seen. Stomach/Bowel: A G-tube is noted ending at the body of the stomach. The stomach is largely decompressed. The small bowel is within normal limits. The appendix is normal in caliber, without evidence of  appendicitis. Diffuse pneumatosis is noted along the ascending colon and cecum, corresponding to the finding noted on chest CT. No abnormal bowel wall thickening or soft tissue inflammation is seen, and the underlying vasculature appears grossly intact. There is no evidence of portal venous gas. This could simply reflect benign pneumatosis, though a variety of conditions can result in pneumatosis. Vascular/Lymphatic: Scattered  calcification is seen along the abdominal aorta and its branches. The abdominal aorta is otherwise grossly unremarkable. The inferior vena cava is grossly unremarkable. No retroperitoneal lymphadenopathy is seen. No pelvic sidewall lymphadenopathy is identified. Reproductive: The bladder is mildly distended and grossly unremarkable. The patient is status post hysterectomy. No suspicious adnexal masses are seen. Other: No additional soft tissue abnormalities are seen. No free intra-abdominal air is seen. Musculoskeletal: No acute osseous abnormalities are identified. Degenerative change is noted at the left side of the pubic symphysis. Multilevel vacuum phenomenon is noted along the lumbar spine. There is grade 2 anterolisthesis of L5 on S1, reflecting chronic bilateral pars defects at L5. The visualized musculature is unremarkable in appearance. IMPRESSION: 1. Diffuse pneumatosis along the ascending colon and cecum, corresponding to the finding noted on chest CT. No abnormal bowel wall thickening or soft tissue inflammation is seen, and the underlying vasculature appears grossly intact. No evidence of portal venous gas. This could simply reflect benign pneumatosis, though a variety of conditions can result in pneumatosis. 2. No free intra-abdominal air seen. 3. Scattered pleural based nodules at the lung bases bilaterally, measuring up to 6 mm in size, as previously noted. 4. Mild coronary artery calcifications seen. 5. Sludge noted within the gallbladder. Gallbladder otherwise unremarkable. 6. Scattered bilateral renal cysts seen. 7. Scattered aortic atherosclerosis noted. 8. Grade 2 anterolisthesis of L5 on S1, reflecting chronic bilateral pars defects at L5. Degenerative change along the lumbar spine. Electronically Signed   By: Garald Balding M.D.   On: 08/12/2016 02:06    Assessment/Plan  1. Cough Noted with a rattle in her throat and adventitious lung sounds Concern for pna and aspiration given tube  feeding Declining overall  No CXRs or antibiotics unless she has an issue that is affecting her comfort, see below Hospice has ordered atropine gtts for secretions  2. Malignant neoplasm of base of tongue (HCC) Progressive with worsening mouth pain and facial edema No further treatment per resident request Continue Fentanyl at 37 mcg as resident still has periods of pain, despite lethargy  3. Diarrhea due to malabsorption Resolved, most likely due to tube feeding but improved with increasing the fentanyl patch and adding florastor  4. Advanced care planning/counseling discussion I spoke with her 1st POA and daughter [name].  She reports that her mom appears to be giving up.  Ms Hartke has spoken with her daughter and has her funeral arranged. She does not want this prolonged and her goals of care are comfort based. We agreed to stop her tube feeding at this point due to her lung congestion and concern for aspiration, as well as the fact she had some nausea when it was administered yesterday. Feeding her would only prolong her life which would not honor her wishes and does not make sense in this setting.  She continues to have some pain and has an overall poor quality of life.  She is followed by hospice and we will continue to monitor her condition and keep her family informed.  Family/ staff Communication: discussed with resident and her daughter [name]  Labs/tests ordered:  NA

## 2016-08-25 ENCOUNTER — Non-Acute Institutional Stay (SKILLED_NURSING_FACILITY): Payer: Medicare Other | Admitting: Adult Health

## 2016-08-25 DIAGNOSIS — R21 Rash and other nonspecific skin eruption: Secondary | ICD-10-CM

## 2016-08-25 NOTE — Progress Notes (Signed)
Location:  [place] of Service:  SNF (31) Provider:   Cindi Carbon, ANP [place] [phone]   REED, Jonelle Sidle, DO  Patient Care Team: Gayland Curry, DO as PCP - General (Geriatric Medicine) [facility] Deneise Lever, MD as Consulting Physician (Pulmonary Disease) Jerrell Belfast, MD as Consulting Physician (Otolaryngology) Rolm Bookbinder, MD as Consulting Physician (Dermatology) [name], MD as Attending Physician (Radiation Oncology) Holley Bouche, NP as Nurse Practitioner (Nurse Practitioner)  Extended Emergency Contact Information Primary Emergency Contact: [place] Phone: [phone] Relation: Other Secondary Emergency Contact: Packman,[name] Address: [address], [place] [address] [place] Phone: [phone] Mobile Phone: [phone] Relation: Daughter  Code Status:  DNR Goals of care: Advanced Directive information Advanced Directives 08/16/2016  Does patient have an advance directive? Yes  Type of Advance Directive Out of facility DNR (pink MOST or yellow form);Living will;Healthcare Power of Attorney  Does patient want to make changes to advanced directive? -  Copy of advanced directive(s) in chart? Yes  Pre-existing out of facility DNR order (yellow form or pink MOST form) Yellow form placed in chart (order not valid for inpatient use);Pink MOST form placed in chart (order not valid for inpatient use)     Chief Complaint  Patient presents with  . Acute Visit    rash    HPI:  Pt is a 82 y.o. female seen today for an acute visit for an itchy rash noted to bilat lower ext. She is currently under hospice care due to advanced oral ca. She also has a hx of bullous pemphigoid.  Currently she is on prednisone and methotrexate for this reason.    Past Medical History:  Diagnosis Date  . Aortic atherosclerosis ([place])  08/29/2013   Identified on Aortic ultrasound exam 2008   . Arthritis   . Asthma   . Bilateral claudication of lower limb ([place]) 08/29/2013   Bilateral posterior thighs  . Bullous pemphigoid 02/2015   Dr. Wilhemina Bonito  . Chronic rhinitis   . Hx of radiation therapy 12/16/15- 01/12/16   Base of Tongue and upper neck, Right Lower neck  . Hyperlipidemia   . Hypertension   . Mitral valve disorders(424.0)   . Nasal polyps   . Osteoporosis   . Unspecified hypothyroidism   . Unspecified urinary incontinence   . Urge incontinence    Past Surgical History:  Procedure Laterality Date  . BREAST LUMPECTOMY Right 1953  . BUNIONECTOMY Left 12/1998  . BUNIONECTOMY Right 02/2000  . DIRECT LARYNGOSCOPY N/A 10/30/2015   Procedure: DIRECT LARYNGOSCOPY AND BIOPSY OF TONGUE MASS;  Surgeon: Jerrell Belfast, MD;  Location: [facility];  Service: ENT;  Laterality: N/A;  . EYE SURGERY Bilateral 02/2000   Cataract removal  . milk cyst right breast    . NASAL POLYP SURGERY  03/2002   x2 Dr Wilburn Cornelia  . ROOT CANAL  02/2014  . TONSILLECTOMY    . TOTAL ABDOMINAL HYSTERECTOMY W/ BILATERAL SALPINGOOPHORECTOMY  1970's    Allergies  Allergen Reactions  . Sulfa Antibiotics   . Ampicillin Swelling  . Chlorine   . Codeine   . Macrobid [Nitrofurantoin Monohydrate Macrocrystals]   . Rosuvastatin Calcium Other (See Comments)  . Statins   . Trimethoprim       Medication List       Accurate as of 08/25/16 11:59 PM. Always use  your most recent med list.          acetaminophen 160 MG/5ML elixir Commonly known as:  TYLENOL Take 500 mg by mouth every 6 (six) hours as needed for fever.   bisacodyl 10 MG suppository Commonly known as:  DULCOLAX Place 10 mg rectally daily as needed for moderate constipation.   cholecalciferol 1000 units tablet Commonly known as:  VITAMIN D Take 2,000 Units by mouth daily.   feeding supplement (JEVITY 1.2 CAL) Liqd Place 1,000 mLs into feeding tube continuous. 60 ml/hr   fentaNYL 50  MCG/HR Commonly known as:  DURAGESIC - dosed mcg/hr Place 50 mcg onto the skin every 3 (three) days.   glycerin adult 2 g suppository Place 1 suppository rectally as needed for constipation.   levothyroxine 75 MCG tablet Commonly known as:  SYNTHROID, LEVOTHROID TAKE 1 TABLET BY MOUTH EVERY MORNING   lidocaine 2 % solution Commonly known as:  XYLOCAINE Use as directed 10 mLs in the mouth or throat every 8 (eight) hours as needed for mouth pain.   methotrexate 2.5 MG tablet Commonly known as:  RHEUMATREX Take 7.5 mg by mouth once a week. Friday   polyethylene glycol packet Commonly known as:  MIRALAX / GLYCOLAX 17 g by PEG Tube route daily as needed for moderate constipation. Reported on 04/21/2016   predniSONE 5 MG tablet Commonly known as:  DELTASONE Take 5 mg by mouth daily with breakfast.   saccharomyces boulardii 250 MG capsule Commonly known as:  FLORASTOR Take 250 mg by mouth 2 (two) times daily.   traMADol 50 MG tablet Commonly known as:  ULTRAM Take 50 mg by mouth every 4 (four) hours as needed for moderate pain or severe pain. Not to exceed 6 tablets in 24 hours   venlafaxine 50 MG tablet Commonly known as:  EFFEXOR Take 50 mg by mouth 3 (three) times daily with meals.       Review of Systems  Unable to perform ROS: Dementia      Immunization History  Administered Date(s) Administered  . Influenza Split 07/25/2011, 07/24/2012  . Influenza Whole 07/24/2010, 08/16/2013  . Influenza-Unspecified 08/11/2014, 08/18/2016  . Pneumococcal Conjugate-13 11/18/2014  . Pneumococcal Polysaccharide-23 01/17/2004   Pertinent  Health Maintenance Due  Topic Date Due  . INFLUENZA VACCINE  Completed  . DEXA SCAN  Completed  . PNA vac Low Risk Adult  Completed   Fall Risk  08/12/2016 04/08/2016 02/23/2016 01/25/2016 12/04/2015  Falls in the past year? Yes Yes Yes No No  Number falls in past yr: - 1 1 - -  Injury with Fall? - Yes No - -  Risk Factor Category  - High Fall  Risk - - -  Risk for fall due to : - History of fall(s) History of fall(s);Impaired mobility;Impaired balance/gait - -  Follow up - Falls evaluation completed Falls evaluation completed;Education provided;Falls prevention discussed - -   Functional Status Survey:    Vitals:   08/30/16 1156  BP: (!) 171/73  Pulse: (!) 102  Resp: 20  Temp: 97.3 F (36.3 C)  SpO2: 93%   There is no height or weight on file to calculate BMI.  Physical Exam  Constitutional: She appears distressed (mild).  Cardiovascular: Normal rate and regular rhythm.   No murmur heard. Pulmonary/Chest: She is in respiratory distress (mild).  bilat rhonchi  Neurological:  Lethargic today  Skin: Skin is warm and dry. Rash (maculopapular rash to bilat lower ext, evidence of excoriation.  one area to  the right lower ext as an annular appearance. Otherwise the rash is scattered to lower ext, not to trunk or arms) noted. She is not diaphoretic. There is erythema.    Labs reviewed:  Recent Labs  12/30/15 1150  01/07/16 1037 01/11/16 1045 01/11/16 1045 04/06/16 1153 04/06/16 1153 07/04/16 1115 07/29/16 08/08/16 0944 08/11/16 2211  NA 139  < > 135* 136  --  139  --  135 129* 137 134*  K 4.1  < > 4.3 4.6  --  4.3  --  4.3 4.7 4.0 3.4*  CL 105  --   --   --   --   --   --  96*  --   --  92*  CO2 26  < > 28 30*  --  29  --   --   --   --   --   GLUCOSE 90  < > 123 165*  --  84  --  103*  --   --  81  BUN 20  < > 24.2 25.8  --  27.5*  --  30* 31* 23* 28*  CREATININE 0.91  < > 0.9 0.9  --  0.9  --  1.10* 0.8 0.7 0.80  CALCIUM 9.3  < > 9.6 9.8  --  10.3  --   --   --   --   --   MG  --   < > 1.8 2.2  --  2.2  --   --   --   --   --   PHOS  --   < > 3.1  --  3.2  --  3.6  --   --   --   --   < > = values in this interval not displayed.  Recent Labs  01/07/16 1037 01/11/16 1045 04/06/16 1153  AST 22 38* 20  ALT 24 47 17  ALKPHOS 67 88 79  BILITOT 0.38 0.32 1.07  PROT 6.4 6.6 7.5  ALBUMIN 2.9* 3.0* 3.7     Recent Labs  12/30/15 1150 07/04/16 1106  07/29/16 08/11/16 2157 08/11/16 2211  WBC 9.1 11.9*  --  10.6 9.7  --   NEUTROABS 7.4 10.3*  --   --  6.8  --   HGB 13.4 14.0  < > 11.9* 13.4 15.3*  HCT 42.2 42.7  < > 34* 40.4 45.0  MCV 99.3 98.4  --   --  98.1  --   PLT 382 394  --  456* 446*  --   < > = values in this interval not displayed. Lab Results  Component Value Date   TSH 1.609 04/06/2016   No results found for: HGBA1C Lab Results  Component Value Date   CHOL 188 06/09/2015   HDL 90 (A) 06/09/2015   LDLCALC 86 06/09/2015   TRIG 85 06/09/2015    Significant Diagnostic Results in last 30 days:  Ct Soft Tissue Neck W Contrast  Result Date: 08/11/2016 CLINICAL DATA:  Restaging base of tongue cancer. Prior radiation therapy. Generalized throat pain. EXAM: CT NECK WITH CONTRAST TECHNIQUE: Multidetector CT imaging of the neck was performed using the standard protocol following the bolus administration of intravenous contrast. CONTRAST:  1mL ISOVUE-300 IOPAMIDOL (ISOVUE-300) INJECTION 61% COMPARISON:  Maxillofacial CT 07/04/2016. PET-CT 04/06/2016. Neck CT 10/12/2015. FINDINGS: Pharynx and larynx: Previously described tongue base tumor has greatly progressed and now appears as a large, irregular, heterogeneously hyperenhancing the mass measuring at least 4.3 x 4.2 x  5.1 cm (AP x transverse x craniocaudal) with adjacent small satellite foci of enhancement. In addition to involving the tongue base, there is now extensive involvement of the oral tongue with intrinsic and extrinsic tongue muscle involvement and extension on the right greater than the left. There is extension posteriorly on the right across the glossotonsillar sulcus. Mild pharyngeal mucosal edema likely reflects radiation changes. Airway is patent. Salivary glands: Post radiation changes in the parotid and submandibular glands. Thyroid: Unchanged small size of the thyroid gland without focal abnormality identified. Lymph  nodes: Marked progression of bilateral cervical lymphadenopathy compared to the prior maxillofacial CT and head CT. Most lymph nodes demonstrate varying degrees of necrosis. Submental lymph nodes measure up to 10 mm in short axis. Submandibular lymph nodes measure up to 11 mm in short axis. Conglomerate right level II nodal mass measures 2.8 x 2.0 cm with ill-defined margins which could reflect extracapsular extension. Left-sided level II lymph nodes measure up to 2.0 x 1.4 cm. There are numerous small necrotic level II, III, and IV lymph nodes bilaterally. A large necrotic left level IV nodal mass measures 3.0 x 2.1 x 4.3 cm. Small lymph nodes or muscular implants along the right lateral margin of the hyoid measure up to 10 x 6 mm. Vascular: Major vascular structures of the neck appear patent. Lymphadenopathy results in areas of mild-to-moderate internal jugular vein compression bilaterally. Limited intracranial: Unremarkable. Visualized orbits: Prior cataract extraction. Mastoids and visualized paranasal sinuses: Prior functional sinus surgery. Chronic right sphenoid sinusitis with complete sinus opacification. Bilateral maxillary sinus osseous wall thickening from chronic sinusitis. Clear mastoids. Skeleton: No suspicious osseous lesion identified. Advanced multilevel cervical facet arthrosis. Unchanged grade 1 anterolisthesis of C2 on C3 and grade 1 retrolisthesis of C3 on C4. Upper chest: Evaluated on separate dedicated chest CT. Other: None. IMPRESSION: Marked interval progression of primary tongue base tumor and bilateral cervical lymphadenopathy as above. Electronically Signed   By: Logan Bores M.D.   On: 08/11/2016 17:09   Ct Chest W Contrast  Result Date: 08/11/2016 CLINICAL DATA:  Followup base of tongue carcinoma. Previous radiation therapy. Followup right apical pulmonary opacity. EXAM: CT CHEST WITH CONTRAST TECHNIQUE: Multidetector CT imaging of the chest was performed during intravenous  contrast administration. CONTRAST:  35mL ISOVUE-300 IOPAMIDOL (ISOVUE-300) INJECTION 61% COMPARISON:  PET-CT on 04/06/2016 FINDINGS: Cardiovascular: No acute findings. Coronary artery calcification. Aortic atherosclerosis. Mediastinum/Nodes: No masses or pathologically enlarged lymph nodes identified within the mediastinum, hilar, or axillary regions. Lungs/Pleura: Opacity in the lateral right lung apex. cyst, but appears more angular in configuration with internal bronchograms, and is suspicious for radiation pneumonitis. Numerous tiny sub-cm pulmonary nodules are seen throughout both lungs which are new since previous study and suspicious for early pulmonary metastases. No evidence of central endobronchial obstruction or pleural effusion. Upper Abdomen: Percutaneous gastrostomy tube is again seen in place. A small amount of free air is seen within the upper abdomen which is new since previous study. This is of uncertain etiology, but is suspicious for bowel perforation in the absence of recent surgical procedure. Musculoskeletal: No suspicious bone lesions are identified, however there is a distal left clavicle fracture which appears subacute in age and is new since 04/06/2016. IMPRESSION: Numerous tiny bilateral sub-cm pulmonary nodules which are new since prior exam, and suspicious for early pulmonary metastases. Mild evolution of opacity in the right lung apex, suspicious for radiation pneumonitis. Continued attention recommended on follow-up CT. Small amount of free air within the upper abdomen which is  new since previous study. This is suspicious for bowel perforation in the absence of recent surgical procedure. Recommend clinical correlation, and consider abdomen pelvis CT with oral and IV contrast for further evaluation. Subacute distal left clavicle fracture. No definite evidence of bone metastases. Critical Value/emergent results were called by telephone at the time of interpretation on 08/11/2016 at 6:31  pm to Dr. [name] , who verbally acknowledged these results. Electronically Signed   By: Earle Gell M.D.   On: 08/11/2016 18:31   Ct Abdomen Pelvis W Contrast  Result Date: 08/12/2016 CLINICAL DATA:  Question of free air within the abdomen on CT of the chest. Further evaluation requested. Initial encounter. EXAM: CT ABDOMEN AND PELVIS WITH CONTRAST TECHNIQUE: Multidetector CT imaging of the abdomen and pelvis was performed using the standard protocol following bolus administration of intravenous contrast. CONTRAST:  80 mL of Isovue 300 IV contrast COMPARISON:  CT of the chest performed earlier today at 2:16 p.m. FINDINGS: Lower chest: Mild coronary artery calcification is noted. Scattered pleural-based nodules are noted at the lung bases bilaterally, measuring up to 6 mm in size. Hepatobiliary: The liver is unremarkable in appearance. Material of mildly increased attenuation dependently within the gallbladder likely reflects sludge. The gallbladder is otherwise unremarkable. The common bile duct remains normal in caliber. Pancreas: The pancreas is within normal limits. Spleen: The spleen is unremarkable in appearance. Adrenals/Urinary Tract: The adrenal glands are unremarkable in appearance. The kidneys are within normal limits Bilateral renal cysts are noted, larger on the left, measuring up to 6.5 cm at the lower pole of the left kidney. There is no evidence of hydronephrosis. No renal or ureteral stones are identified. No perinephric stranding is seen. Stomach/Bowel: A G-tube is noted ending at the body of the stomach. The stomach is largely decompressed. The small bowel is within normal limits. The appendix is normal in caliber, without evidence of appendicitis. Diffuse pneumatosis is noted along the ascending colon and cecum, corresponding to the finding noted on chest CT. No abnormal bowel wall thickening or soft tissue inflammation is seen, and the underlying vasculature appears grossly intact.  There is no evidence of portal venous gas. This could simply reflect benign pneumatosis, though a variety of conditions can result in pneumatosis. Vascular/Lymphatic: Scattered calcification is seen along the abdominal aorta and its branches. The abdominal aorta is otherwise grossly unremarkable. The inferior vena cava is grossly unremarkable. No retroperitoneal lymphadenopathy is seen. No pelvic sidewall lymphadenopathy is identified. Reproductive: The bladder is mildly distended and grossly unremarkable. The patient is status post hysterectomy. No suspicious adnexal masses are seen. Other: No additional soft tissue abnormalities are seen. No free intra-abdominal air is seen. Musculoskeletal: No acute osseous abnormalities are identified. Degenerative change is noted at the left side of the pubic symphysis. Multilevel vacuum phenomenon is noted along the lumbar spine. There is grade 2 anterolisthesis of L5 on S1, reflecting chronic bilateral pars defects at L5. The visualized musculature is unremarkable in appearance. IMPRESSION: 1. Diffuse pneumatosis along the ascending colon and cecum, corresponding to the finding noted on chest CT. No abnormal bowel wall thickening or soft tissue inflammation is seen, and the underlying vasculature appears grossly intact. No evidence of portal venous gas. This could simply reflect benign pneumatosis, though a variety of conditions can result in pneumatosis. 2. No free intra-abdominal air seen. 3. Scattered pleural based nodules at the lung bases bilaterally, measuring up to 6 mm in size, as previously noted. 4. Mild coronary artery calcifications seen. 5.  Sludge noted within the gallbladder. Gallbladder otherwise unremarkable. 6. Scattered bilateral renal cysts seen. 7. Scattered aortic atherosclerosis noted. 8. Grade 2 anterolisthesis of L5 on S1, reflecting chronic bilateral pars defects at L5. Degenerative change along the lumbar spine. Electronically Signed   By: Garald Balding M.D.   On: 08/12/2016 02:06    Assessment/Plan  1. Rash and nonspecific skin eruption Clobetasol 0.05% BID x 7 days.  May be transitioning at the end of life.  Would continue prednisone and methotrexate. Its not clear if this is a dermatitis or related to her dx of bullous.  Continue to monitor at this time.  Would not increase prednisone as she is no longer eating and higher dose causes more confusion for her.

## 2016-08-26 ENCOUNTER — Ambulatory Visit (INDEPENDENT_AMBULATORY_CARE_PROVIDER_SITE_OTHER): Payer: Medicare Other | Admitting: Orthopaedic Surgery

## 2016-08-29 ENCOUNTER — Encounter: Payer: Self-pay | Admitting: Internal Medicine

## 2016-08-29 ENCOUNTER — Ambulatory Visit (INDEPENDENT_AMBULATORY_CARE_PROVIDER_SITE_OTHER): Payer: Medicare Other | Admitting: Orthopaedic Surgery

## 2016-08-29 DIAGNOSIS — C799 Secondary malignant neoplasm of unspecified site: Secondary | ICD-10-CM | POA: Insufficient documentation

## 2016-08-30 ENCOUNTER — Encounter: Payer: Self-pay | Admitting: Adult Health

## 2016-09-09 ENCOUNTER — Telehealth: Payer: Self-pay | Admitting: Adult Health

## 2016-09-09 NOTE — Telephone Encounter (Signed)
I received a call from [facility], one of [name]'s daughters to let us know that her mother passed away on Wednesday, October 03, 2016.    A celebration of her life will be held at [facility] on Saturday, December 9th, from 12pm-2pm.  [name] asked that I share this information with her mother's care team (Dr. Isidore Moos, Gayleen Orem, and Lorrin Jackson), which I am happy to do.   I offered my sincerest condolences to [name] and her family.  Encouraged her to call us if she has other needs.   Mike Craze, NP [place] [phone]

## 2016-09-23 DEATH — deceased

## 2017-02-14 IMAGING — CR DG SHOULDER 2+V*L*
2 series · 2 of 2 positions shown · non-contrast
Comparison: Chest x-ray 12/25/2013

CLINICAL DATA: Fall x3 last night with left shoulder pain.

EXAM:
LEFT SHOULDER - 2+ VIEW

[x shoulder ap left (1 of 2)]
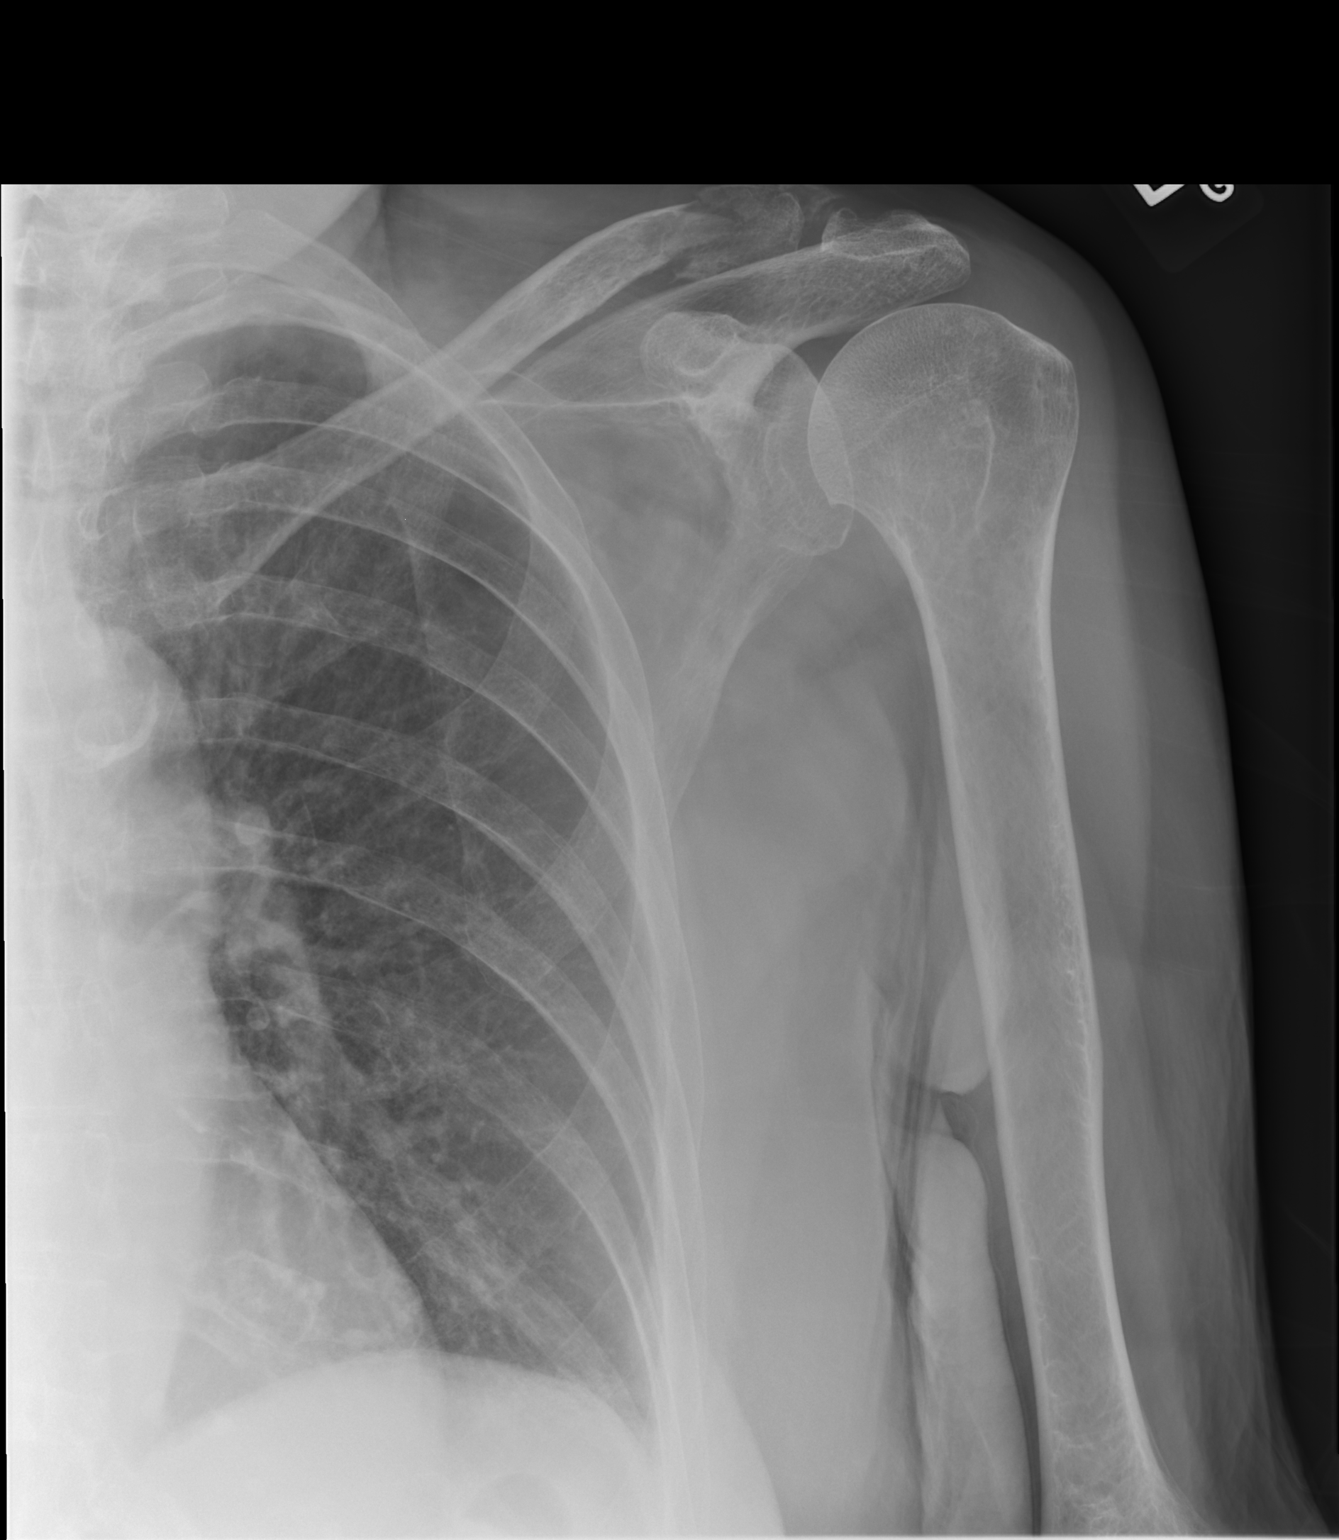

[x shoulder ap left (2 of 2)]
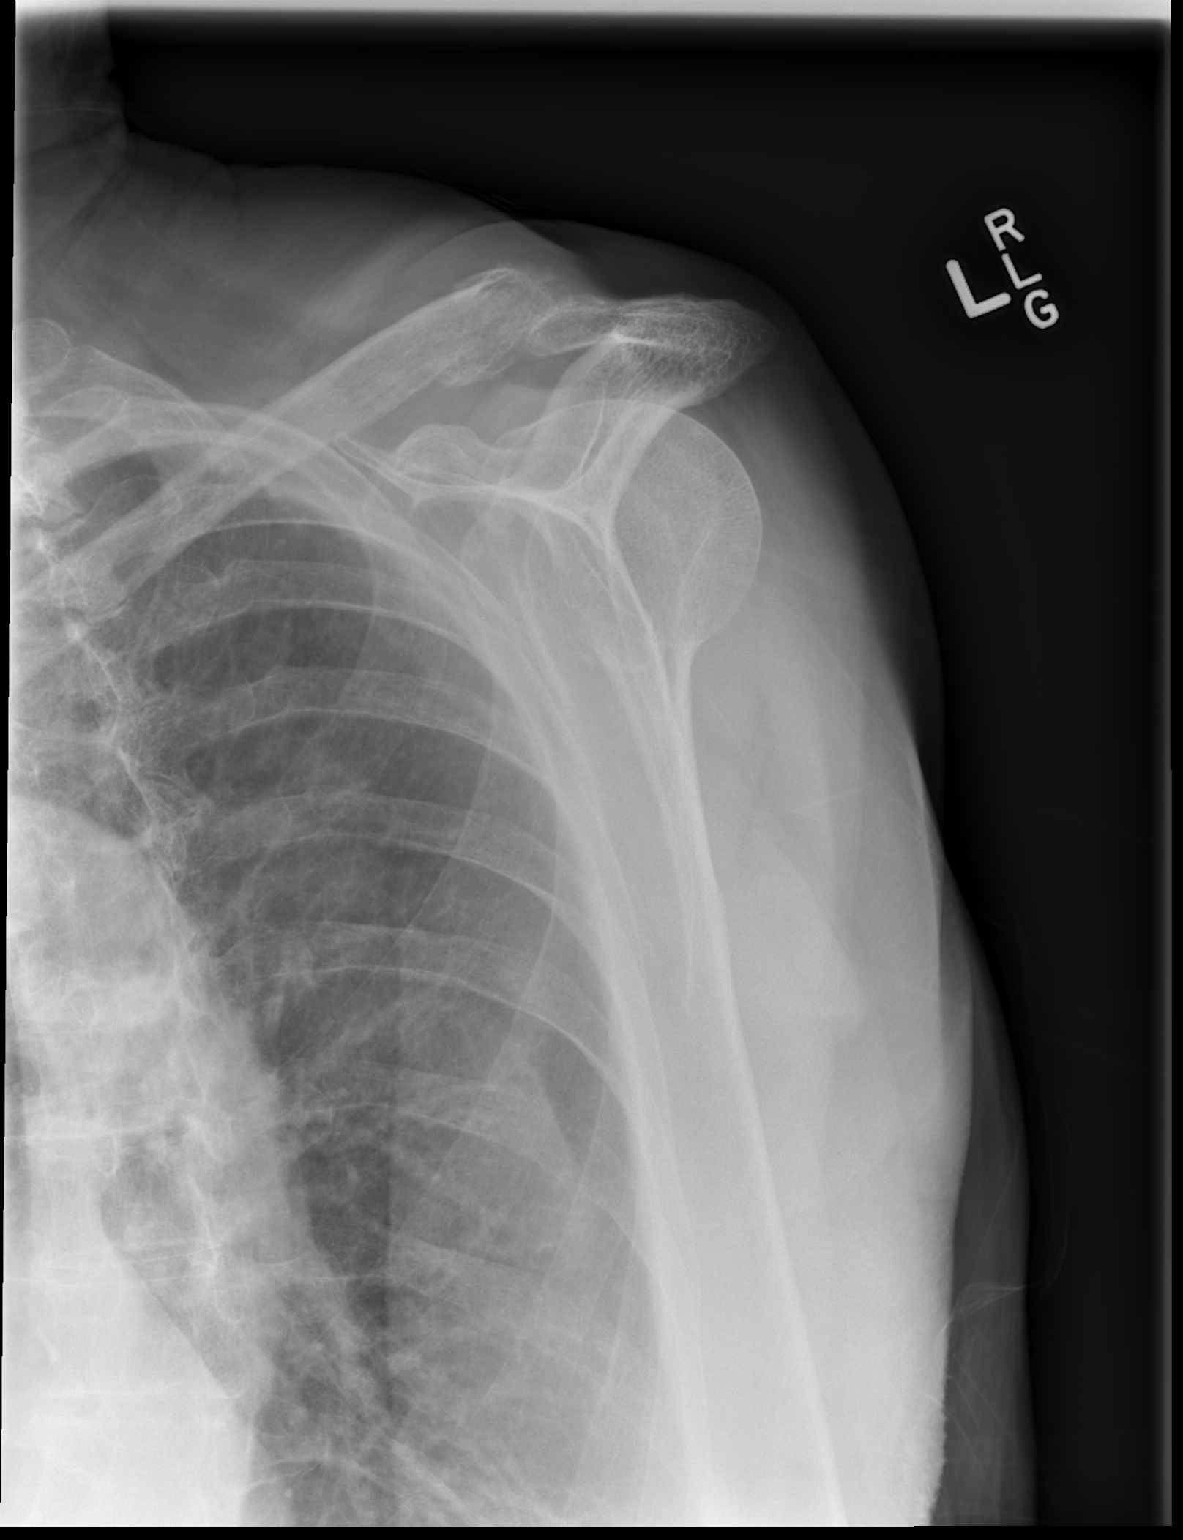

[2 of 2 positions shown; findings below may reference images not displayed]

FINDINGS: Examination demonstrates a comminuted mildly displaced fracture of
the distal clavicle. Mild degenerative change of the glenohumeral
joint.
IMPRESSION: Comminuted mildly displaced distal left clavicle fracture.

## 2017-07-28 NOTE — Therapy (Signed)
[facility] [address] [place], [place], [address] Phone: [phone]   Fax:  [phone]  Patient Details  Name: [name] MRN: [id number] Date of Birth: February 19, 1924 Referring Provider:  [name] MD  Encounter Date: 07/28/2017  SPEECH THERAPY DISCHARGE SUMMARY  Visits from Start of Care: 2  Current functional level related to goals / functional outcomes: Pt had all goals remaining. She had been seen for one visit after eval and did not return to [facility]. Please see goals on initial evaluation.   Remaining deficits: Assumed deficits remained as pt was not seen for > 1 ST session   Education / Equipment: HEP, late effects head/neck radiation on swallow function  Plan: Patient agrees to discharge.  Patient goals were not met. Patient is being discharged due to not returning since the last visit.  ?????      [place] ,MS, CCC-SLP  07/28/2017, 4:32 PM  [facility] [address] [place], [place], [address] Phone: [phone]   Fax:  [phone]
